# Patient Record
Sex: Male | Born: 1950 | Race: Black or African American | Hispanic: No | Marital: Single | Smoking: Former smoker
Health system: Southern US, Community
[De-identification: ages and names within clinical notes are randomized; demographics above are authoritative.]

## PROBLEM LIST (undated history)

## (undated) DIAGNOSIS — I10 Essential (primary) hypertension: Secondary | ICD-10-CM

---

## 2012-05-20 ENCOUNTER — Emergency Department: Payer: Self-pay | Admitting: *Deleted

## 2012-10-07 ENCOUNTER — Observation Stay: Payer: Self-pay | Admitting: Internal Medicine

## 2012-10-07 LAB — CBC
HCT: 47.8 % (ref 40.0–52.0)
HGB: 16.2 g/dL (ref 13.0–18.0)
MCH: 31.1 pg (ref 26.0–34.0)
MCHC: 33.8 g/dL (ref 32.0–36.0)
MCV: 92 fL (ref 80–100)
RBC: 5.2 10*6/uL (ref 4.40–5.90)
RDW: 13.9 % (ref 11.5–14.5)
WBC: 7.6 10*3/uL (ref 3.8–10.6)

## 2012-10-07 LAB — BASIC METABOLIC PANEL
Anion Gap: 7 (ref 7–16)
BUN: 10 mg/dL (ref 7–18)
Chloride: 106 mmol/L (ref 98–107)
EGFR (African American): >60
EGFR (Non-African Amer.): >60
Sodium: 141 mmol/L (ref 136–145)

## 2012-10-08 LAB — BASIC METABOLIC PANEL
Anion Gap: 7 (ref 7–16)
BUN: 10 mg/dL (ref 7–18)
Calcium, Total: 9.4 mg/dL (ref 8.5–10.1)
Co2: 25 mmol/L (ref 21–32)
EGFR (Non-African Amer.): >60
Osmolality: 279 (ref 275–301)
Potassium: 4 mmol/L (ref 3.5–5.1)
Sodium: 139 mmol/L (ref 136–145)

## 2014-12-24 NOTE — Discharge Summary (Signed)
PATIENT NAME:  Lawrence Barnett, Lawrence Barnett MR#:  [id number] DATE OF BIRTH:  24-Oct-1950  DATE OF ADMISSION:  10/07/2012 DATE OF DISCHARGE:  10/08/2012  ADMITTING PHYSICIAN:  Dr. Mordecai MaesSanchez.  DISCHARGING PHYSICIAN:  Dr. Enid Baasadhika Saivon Prowse.  PRIMARY CARE PHYSICIAN:  None.   CONSULTATIONS IN THE HOSPITAL:  None.   DISCHARGE DIAGNOSES: 1.  Acute angioedema with facial swelling secondary to lisinopril.  2.  Hypertension.  3.  Tobacco use disorder.  4.  Gastroesophageal reflux disease.   DISCHARGE MEDICATIONS: 1.  Over-the-counter lansoprazole 30 mg by mouth daily.  2.  Metoprolol 25 mg by mouth twice daily.  3.  HCTZ 25 mg by mouth daily.  4.  Prednisone taper.   DISCHARGE DIET:  Low-sodium diet.   DISCHARGE ACTIVITY:  As tolerated.   FOLLOWUP INSTRUCTIONS:  PCP follow-up in one week.  The patient was in the process of getting an outpatient PCP appointment done.  He said he would take care of it.   LABORATORY AND IMAGING STUDIES:  Sodium 139, potassium 4.0, chloride 107, bicarb 25, BUN 10, creatinine 0.83, glucose 133, calcium of 9.4.  WBC 7.6, hemoglobin 16.2, hematocrit 47.8, platelet count 306.   BRIEF HOSPITAL COURSE:  The patient is a 64 year old male with no significant past medical history other than recently diagnosed with hypertension and was started on lisinopril at an urgent care center when he presented for upper respiratory tract infection symptoms.  Five days into treatment he noticed that his face was swollen and was brought to the ER.   Acute angioedema, likely secondary to lisinopril.  His lisinopril was stopped.  He was started on IV steroids and ranitidine for antihistamine properties and also Benadryl as needed.  His symptoms have improved a lot.  He did not have any airway compromise or stridor and he was able to ambulate, breathe normally prior to discharge and he was very anxious to go home since patient's swelling has improved and was almost back to normal.  He was started on  metoprolol and also HCTZ which he tolerated well in the hospital and is being discharged on the same medications.  He was also counseled against smoking and advised to get an appointment with primary care physician in the next week.  He was explained about the symptoms that he needs to watch out for and come to the ER if he has any trouble.  His course has been otherwise uneventful in the hospital.   DISCHARGE CONDITION:  Stable.   DISCHARGE DISPOSITION:  Home.   TIME SPENT ON DISCHARGE:  Forty minutes.     ____________________________ Enid Baasadhika Elecia Serafin, MD rk:ea Barnett: 10/08/2012 16:14:00 ET T: 10/09/2012 00:04:31 ET JOB#: [id number]  cc: Enid Baasadhika Mariyana Fulop, MD, <Dictator> Enid BaasADHIKA Skyann Ganim MD ELECTRONICALLY SIGNED 10/09/2012 19:49

## 2014-12-24 NOTE — H&P (Signed)
PATIENT NAME:  Lawrence Barnett, Lawrence Barnett MR#:  [id number] DATE OF BIRTH:  11-19-1950  DATE OF ADMISSION:  10/07/2012  PRIMARY CARE PHYSICIAN: None local.   REFERRING PHYSICIAN: Dr. Si Raider.  CHIEF COMPLAINT: Swelling of tongue, mouth and neck, vomiting.   HISTORY OF PRESENT ILLNESS: The patient is a very nice 64 year old gentleman who has history of hypertension and GERD. He comes today with a history of going to the urgent care due to swelling of his mouth and tongue. Apparently, the patient had been seen 5 days ago at the urgent care due to cough. At that moment, he was diagnosed with upper respiratory infection and he was given azithromycin. The patient completed the treatment with azithromycin. He also was given a prescription for lisinopril and hydrochlorothiazide due to his blood pressure being elevated. He was given a sample box of 30 pills and he took 5 of them. He did not have any problems up until this morning. Whenever he got up he went to the bathroom, started to have breakfast, went to have a cigarette and then went to brush his teeth. When he was doing some swish and spit with Listerine, he started having a bad taste in mouth so he spit the Listerine, walked a couple of steps and then he had a severe episode of vomiting that came out without any significant nausea. He vomited a little bit more after that, and then he decided to go to the urgent care. During his car ride, he started having some significant swelling of his neck, mouth and tongue and tingling around his mouth. When he was in the urgent care, he was diagnosed with angioedema. He had severe swelling. He was given Benadryl and Solu Medrol IV. His voice was very muffled and his tongue was really swollen. He did not have any chest pain or shortness of breath or stridor during these episodes.   The patient was transferred to the ER where he was found to have elevated blood pressures with systolics of 198 and diastolics up to 120. The  patient never had any decrease of oxygen saturation. He is actually feeling a little bit better, but still has pronounced swelling of oral mucosa and his pulse is around 100. His blood pressure continues to be severely elevated. The patient is admitted to CCU for close observation due to this possible fatal condition.   REVIEW OF SYSTEMS:  CONSTITUTIONAL: Denies any fever, fatigue, weakness.  EYES: Denies any blurry vision, glaucoma, cataracts or eye pain.  ENT: Negative tinnitus. Negative ear pain. Positive swelling of the tongue. Swelling of the mouth mucosa. No epistaxis. No postnasal drip. No difficulty swallowing at this moment. He can swallow saliva but no food.  RESPIRATORY: No cough. No wheezing. No hemoptysis. No chronic COPD. No pneumonia. He was treated for upper respiratory infection with azithromycin and it has improved.  CARDIOVASCULAR: No chest pain, orthopnea, palpitations or syncope.  GASTROINTESTINAL: No nausea. Positive vomiting twice. No diarrhea. No abdominal pain. No hematemesis. No melena. No jaundice. No rectal bleeding.  GENITOURINARY: No hematuria, dysuria or changes in frequency. No prostatitis.  ENDOCRINE: No polyuria, polydipsia or polyphagia. No cold or heat intolerance.  HEMATOLOGIC AND LYMPHATIC: No anemia, easy bruising, bleeding or swollen glands. SKIN: No rashes or petechiae.  MUSCULOSKELETAL: No significant neck or back pain, shoulder pain or gout.  NEUROLOGIC: No numbness, tingling. No ataxia. No CVA. No TIA.  PSYCHIATRIC: No depression or anxiety.   PAST MEDICAL HISTORY:  1.  Hypertension.  2.  GERD.   ALLERGIES: ACE INHIBITORS AND SHELLFISH.   PAST SURGICAL HISTORY: Negative.   FAMILY HISTORY: Negative for MI. Negative for diabetes or hypertension. Positive cancer in his dad who had bone cancer, and his mom had some cancer that he cannot tell me where it was located. The patient tears up and gets really sad by talking about his mother's condition. She  passed in 2008.   SOCIAL HISTORY: The patient lives by himself. He moved from [place] about a year ago. 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 He smokes 7 or 8 cigarettes a day and he has been smoking since the age of 44. He states that he drinks 3 or 4 ounces of wine every day.   MEDICATIONS: Pepcid AC, Tylenol, hydrochlorothiazide and Claritin-D. His medication was changed 5 days ago for lisinopril with hydrochlorothiazide, which we stopped now.   PHYSICAL EXAMINATION:  VITAL SIGNS: Blood pressure 198/120, pulse 99, respirations 20, temperature 98, oxygen saturation 100% on room air.  GENERAL: The patient is alert, oriented x 3, not in acute distress. No respiratory distress. Hemodynamically stable.  HEENT: His pupils are equal and reactive. Extraocular movements are intact. Mucosae are moist but swollen at the level of the mouth. His tongue is enlarged, very swollen. I cannot see uvula or soft palate due to swelling of the tongue. He has swelling of the base of the mouth. There is no swelling of the lips. There is some significant swelling at the level of the neck from the angle of the jaw up to the thyroid cartilage.  NECK: There is no stridor in his neck. Otherwise does not have any masses or thyromegaly. There is no lymphadenopathy.  CARDIOVASCULAR: Regular rate and rhythm, slightly tachycardic. No murmurs, rubs or gallops. Positive S4. No displacement of PMI. No tenderness to palpation of anterior chest.  LUNGS: Clear without any wheezing, crepitus or rales. No use of accessory muscles.  ABDOMEN: Soft, nontender, nondistended. No hepatosplenomegaly. No masses. Bowel sounds are positive.  GENITALIA: Deferred.  EXTREMITIES: No edema, no cyanosis, no clubbing.  SKIN: Without any rashes or petechiae. No erythema at any level of the neck or face. No wheals. LYMPHATIC: Negative for lymphadenopathy in the neck,  supraclavicular areas or epitrochlear.  MUSCULOSKELETAL: No significant joint deformity, joint abnormalities.  PSYCHIATRIC: Mood is normal without any signs of depression, although the patient tears up whenever he talks about his dead mother.  NEUROLOGIC: Cranial nerves II through XII intact. No focal abnormalities.   LABORATORY DATA: Glucose 105, creatinine 0.83, electrolytes within normal limits. White count is 7.6, hemoglobin 16.2, platelets 306. EKG: Normal sinus rhythm, no signs of LVH.   ASSESSMENT AND PLAN: This is a 64 year old gentleman with history of hypertension and gastroesophageal reflux disease, recent prescription for lisinopril. The patient developed severe angioedema 5 days after he started the prescription.  1.  Angioedema due to angiotensin-converting enzyme inhibitors. Lisinopril has been stopped. The patient was given Solu Medrol and Benadryl at the urgent care, transfer over here to the Emergency Room for observation admission. The patient is going to be admitted to the Critical Care Unit to be monitored closely for any type of airway obstruction. The patient has not shown any respiratory distress or stridor. He is tachycardic and hypertensive. He needs to be watched closely. Angiotensin-converting enzyme inhibitors have not shown to have any significant improvement of symptoms of angioedema, although at this moment I  do not have many other options to use in him, so we are going to continue IV Solu Medrol. We are going to continue H2 inhibitors with ranitidine and H1 inhibitors with Benadryl. If there is any worsening, we could use fresh frozen plasma since they contain angiotensin-converting enzyme, which could break down bradykinins which are the cause of the angioedema. At this moment he is stable. We are just going to observe very closely.  2.  Malignant hypertension. He has diastolic blood pressures of 120s. Labetalol and hydralazine have been administered to the patient. Monitor  closely. The patient has a prescription for hydrochlorothiazide in the past. We are going to restart and I am going to add on amlodipine. The patient has been informed about side effects of amlodipine, which usually do not include angioedema but could cause peripheral edema.  3.  Tobacco abuse. The patient is advised about consequences of tobacco use. He has been consulted for about 4 minutes about tobacco/smoking cessation.   CODE STATUS: The patient is a full code.   CRITICAL CARE TIME: I spent about 45 minutes with this patient.   ____________________________ Felipa Furnaceoberto Sanchez Gutierrez, MD rsg:jm D: 10/07/2012 17:15:23 ET T: 10/07/2012 19:25:40 ET JOB#: [id number]  cc: Felipa Furnaceoberto Sanchez Gutierrez, MD, <Dictator> Aul Mangieri Juanda ChanceSANCHEZ GUTIERRE MD ELECTRONICALLY SIGNED 10/14/2012 13:45

## 2018-03-09 ENCOUNTER — Emergency Department
Admission: EM | Admit: 2018-03-09 | Discharge: 2018-03-09 | Disposition: A | Discharge status: Home / Self Care | Payer: Medicare Other | Attending: Emergency Medicine | Admitting: Emergency Medicine

## 2018-03-09 ENCOUNTER — Encounter: Payer: Self-pay | Admitting: Emergency Medicine

## 2018-03-09 DIAGNOSIS — I10 Essential (primary) hypertension: Secondary | ICD-10-CM | POA: Diagnosis not present

## 2018-03-09 DIAGNOSIS — Z87891 Personal history of nicotine dependence: Secondary | ICD-10-CM | POA: Insufficient documentation

## 2018-03-09 DIAGNOSIS — T783XXA Angioneurotic edema, initial encounter: Secondary | ICD-10-CM | POA: Diagnosis not present

## 2018-03-09 DIAGNOSIS — R220 Localized swelling, mass and lump, head: Secondary | ICD-10-CM | POA: Diagnosis present

## 2018-03-09 HISTORY — DX: Essential (primary) hypertension: I10

## 2018-03-09 MED ORDER — FAMOTIDINE IN NACL 20-0.9 MG/50ML-% IV SOLN
INTRAVENOUS | Status: AC
  Filled 2018-03-09: qty 50

## 2018-03-09 MED ORDER — DIPHENHYDRAMINE HCL 50 MG/ML IJ SOLN
50.0000 mg | Freq: Once | INTRAMUSCULAR | Status: AC
  Administered 2018-03-09: 50 mg via INTRAVENOUS

## 2018-03-09 MED ORDER — PREDNISONE 20 MG PO TABS: 60.0000 mg | ORAL_TABLET | Freq: Every day | ORAL | 0 refills | Status: DC

## 2018-03-09 MED ORDER — DIPHENHYDRAMINE HCL 50 MG/ML IJ SOLN
INTRAMUSCULAR | Status: AC
  Filled 2018-03-09: qty 1

## 2018-03-09 MED ORDER — EPINEPHRINE 0.3 MG/0.3ML IJ SOAJ
INTRAMUSCULAR | Status: AC
  Filled 2018-03-09: qty 0.3

## 2018-03-09 MED ORDER — FAMOTIDINE 40 MG PO TABS: 40.0000 mg | ORAL_TABLET | Freq: Every evening | ORAL | 0 refills | Status: DC

## 2018-03-09 MED ORDER — METHYLPREDNISOLONE SODIUM SUCC 125 MG IJ SOLR
125.0000 mg | Freq: Once | INTRAMUSCULAR | Status: AC
  Administered 2018-03-09: 125 mg via INTRAVENOUS

## 2018-03-09 MED ORDER — FAMOTIDINE IN NACL 20-0.9 MG/50ML-% IV SOLN
20.0000 mg | Freq: Once | INTRAVENOUS | Status: AC
  Administered 2018-03-09: 20 mg via INTRAVENOUS

## 2018-03-09 MED ORDER — METHYLPREDNISOLONE SODIUM SUCC 125 MG IJ SOLR
INTRAMUSCULAR | Status: AC
  Filled 2018-03-09: qty 2

## 2018-03-09 NOTE — ED Triage Notes (Signed)
Patient states that he woke up about 01:00 with lip swelling. Patient states that the swelling has been getting worse.

## 2018-03-09 NOTE — ED Notes (Signed)
Assessment: pt with upper and lower lip angioedema. Pt states he woke up this am with upper lip swelling, pt states on way to ed began to have lower lip swelling. Pt does not have airway compromise at this time. resps unlabored. Pt denies nausea, shob, throat pain or sensation of throat swelling.

## 2018-03-09 NOTE — ED Provider Notes (Signed)
[facility] Emergency Department Provider Note   ____________________________________________   First MD Initiated Contact with Patient 03/09/18 0303     (approximate)  I have reviewed the triage vital signs and the nursing notes.   HISTORY  Chief Complaint Oral Swelling    HPI Lawrence Barnett is a 67 y.o. male who comes into the hospital today with some swelling to his mouth.  The patient states that he went to bed fine around 10:00 and then woke up at 1 AM with swelling to his top lip.  The patient is never had this happen before.  The patient takes benazepril daily.  He reports that as he came in here his lower lip seemed to get more swollen.  He denies any swelling to the back of his throat or any shortness of breath.  The patient is here today for evaluation.   Past Medical History:  Diagnosis Date  . Hypertension     There are no active problems to display for this patient.   History reviewed. No pertinent surgical history.  Prior to Admission medications   Medication Sig Start Date End Date Taking? Authorizing Provider  famotidine (PEPCID) 40 MG tablet Take 1 tablet (40 mg total) by mouth every evening. 03/09/18 03/09/19  Rebecka Apley, MD  predniSONE (DELTASONE) 20 MG tablet Take 3 tablets (60 mg total) by mouth daily. 03/09/18   Rebecka Apley, MD    Allergies Penicillins  No family history on file.  Social History Social History   Tobacco Use  . Smoking status: Former Games developer  . Smokeless tobacco: Never Used  Substance Use Topics  . Alcohol use: Yes  . Drug use: Never    Review of Systems  Constitutional: No fever/chills Eyes: No visual changes. ENT: Lip swelling Cardiovascular: Denies chest pain. Respiratory: Denies shortness of breath. Gastrointestinal: No abdominal pain.  No nausea, no vomiting.   Genitourinary: Negative for dysuria. Musculoskeletal: Negative for back pain. Skin: Negative for rash. Neurological:  Negative for headaches, focal weakness or numbness.   ____________________________________________   PHYSICAL EXAM:  VITAL SIGNS: ED Triage Vitals  Enc Vitals Group     BP 03/09/18 0303 (!) 184/118     Pulse Rate 03/09/18 0303 87     Resp --      Temp 03/09/18 0303 97.7 F (36.5 C)     Temp Source 03/09/18 0303 Oral     SpO2 03/09/18 0303 99 %     Weight 03/09/18 0254 190 lb (86.2 kg)     Height 03/09/18 0254 6' (1.829 m)     Head Circumference --      Peak Flow --      Pain Score 03/09/18 0254 0     Pain Loc --      Pain Edu? --      Excl. in GC? --     Constitutional: Alert and oriented. Well appearing and in no acute distress. Eyes: Conjunctivae are normal. PERRL. EOMI. Head: Atraumatic. Nose: No congestion/rhinnorhea. Mouth/Throat: Upper and lower lip swelling mucous membranes are moist.  Oropharynx non-erythematous. Cardiovascular: Normal rate, regular rhythm. Grossly normal heart sounds.  Good peripheral circulation. Respiratory: Normal respiratory effort.  No retractions. Lungs CTAB. Gastrointestinal: Soft and nontender. No distention.  Positive bowel sounds Musculoskeletal: No lower extremity tenderness nor edema.   Neurologic:  Normal speech and language.  Skin:  Skin is warm, dry and intact.  Psychiatric: Mood and affect are normal.   ____________________________________________   LABS (  all labs ordered are listed, but only abnormal results are displayed)  Labs Reviewed - No data to display ____________________________________________  EKG  none ____________________________________________  RADIOLOGY  ED MD interpretation:  none  Official radiology report(s): No results found.  ____________________________________________   PROCEDURES  Procedure(s) performed: None  Procedures  Critical Care performed: No  ____________________________________________   INITIAL IMPRESSION / ASSESSMENT AND PLAN / ED COURSE  As part of my medical  decision making, I reviewed the following data within the electronic MEDICAL RECORD NUMBER Notes from prior ED visits and [place] Controlled Substance Database   This is a 67 year old male who comes into the hospital today with some angioedema.  The patient's lips are swollen but he is not having any tongue swelling or shortness of breath.  We did give the patient a dose of Benadryl 50 mg IV x1 dose, Pepcid 20 mg IV x1 dose and Solu-Medrol 125 mg IV x1 dose.  We then monitor the patient for a few hours in the emergency department.  After being monitored the patient's lip swelling did improve.  The patient will be discharged home.  He is been told no longer to take his benazepril and to follow-up with his primary care physician.      ____________________________________________   FINAL CLINICAL IMPRESSION(S) / ED DIAGNOSES  Final diagnoses:  Angioedema, initial encounter     ED Discharge Orders        Ordered    predniSONE (DELTASONE) 20 MG tablet  Daily     03/09/18 0636    famotidine (PEPCID) 40 MG tablet  Every evening     03/09/18 0636       Note:  This document was prepared using Dragon voice recognition software and may include unintentional dictation errors.    Rebecka ApleyWebster, Allison P, MD 03/09/18 [phone]

## 2018-03-09 NOTE — ED Notes (Signed)
Pt up to urinate. 

## 2018-03-09 NOTE — ED Notes (Signed)
Pt states he would like to be discharged if possible.

## 2018-03-09 NOTE — Discharge Instructions (Addendum)
Please no longer take your benazepril as I believe that is the cause of your lip swelling. Please follow up with your primary care physician and please return with any worsened symptoms or any other concerns.

## 2018-03-09 NOTE — ED Notes (Signed)
md in to reassess. 

## 2018-03-09 NOTE — ED Notes (Signed)
Pt with improved upper lip swelling, no noticeable improvement in lower lip swelling. Pt continues to deny shob, sensation of throat swelling.

## 2018-03-09 NOTE — ED Notes (Signed)
Pt with slightly improved upper lip swelling. Pt continues to deny sensation of throat swelling or difficulty breathing. Pt is able to speak in full clear sentences and displays no airway compromise at this time.

## 2019-07-16 ENCOUNTER — Encounter: Payer: Self-pay | Admitting: Emergency Medicine

## 2019-07-16 ENCOUNTER — Other Ambulatory Visit: Payer: Self-pay

## 2019-07-16 ENCOUNTER — Emergency Department: Payer: Medicare Other

## 2019-07-16 ENCOUNTER — Inpatient Hospital Stay: Payer: Medicare Other

## 2019-07-16 ENCOUNTER — Inpatient Hospital Stay
Admission: EM | Admit: 2019-07-16 | Discharge: 2019-07-17 | DRG: 871 | Disposition: A | Discharge status: Other Acute Inpatient Hospital | Payer: Medicare Other | Attending: Internal Medicine | Admitting: Internal Medicine

## 2019-07-16 DIAGNOSIS — A419 Sepsis, unspecified organism: Secondary | ICD-10-CM | POA: Diagnosis not present

## 2019-07-16 DIAGNOSIS — K709 Alcoholic liver disease, unspecified: Secondary | ICD-10-CM | POA: Diagnosis present

## 2019-07-16 DIAGNOSIS — N170 Acute kidney failure with tubular necrosis: Secondary | ICD-10-CM | POA: Diagnosis present

## 2019-07-16 DIAGNOSIS — Z79899 Other long term (current) drug therapy: Secondary | ICD-10-CM

## 2019-07-16 DIAGNOSIS — Y93E1 Activity, personal bathing and showering: Secondary | ICD-10-CM

## 2019-07-16 DIAGNOSIS — R6520 Severe sepsis without septic shock: Secondary | ICD-10-CM | POA: Diagnosis present

## 2019-07-16 DIAGNOSIS — Z880 Allergy status to penicillin: Secondary | ICD-10-CM | POA: Diagnosis not present

## 2019-07-16 DIAGNOSIS — J1289 Other viral pneumonia: Secondary | ICD-10-CM | POA: Diagnosis present

## 2019-07-16 DIAGNOSIS — R000 Tachycardia, unspecified: Secondary | ICD-10-CM | POA: Diagnosis present

## 2019-07-16 DIAGNOSIS — E876 Hypokalemia: Secondary | ICD-10-CM | POA: Diagnosis present

## 2019-07-16 DIAGNOSIS — R918 Other nonspecific abnormal finding of lung field: Secondary | ICD-10-CM | POA: Diagnosis not present

## 2019-07-16 DIAGNOSIS — U071 COVID-19: Secondary | ICD-10-CM | POA: Diagnosis present

## 2019-07-16 DIAGNOSIS — Z886 Allergy status to analgesic agent status: Secondary | ICD-10-CM | POA: Diagnosis not present

## 2019-07-16 DIAGNOSIS — Z888 Allergy status to other drugs, medicaments and biological substances status: Secondary | ICD-10-CM

## 2019-07-16 DIAGNOSIS — R197 Diarrhea, unspecified: Secondary | ICD-10-CM | POA: Diagnosis present

## 2019-07-16 DIAGNOSIS — S0990XA Unspecified injury of head, initial encounter: Secondary | ICD-10-CM | POA: Diagnosis present

## 2019-07-16 DIAGNOSIS — R7401 Elevation of levels of liver transaminase levels: Secondary | ICD-10-CM

## 2019-07-16 DIAGNOSIS — A4189 Other specified sepsis: Principal | ICD-10-CM | POA: Diagnosis present

## 2019-07-16 DIAGNOSIS — Z87891 Personal history of nicotine dependence: Secondary | ICD-10-CM

## 2019-07-16 DIAGNOSIS — I10 Essential (primary) hypertension: Secondary | ICD-10-CM | POA: Diagnosis present

## 2019-07-16 DIAGNOSIS — W182XXA Fall in (into) shower or empty bathtub, initial encounter: Secondary | ICD-10-CM | POA: Diagnosis present

## 2019-07-16 DIAGNOSIS — R4182 Altered mental status, unspecified: Secondary | ICD-10-CM

## 2019-07-16 DIAGNOSIS — F1010 Alcohol abuse, uncomplicated: Secondary | ICD-10-CM | POA: Diagnosis present

## 2019-07-16 DIAGNOSIS — J181 Lobar pneumonia, unspecified organism: Secondary | ICD-10-CM

## 2019-07-16 DIAGNOSIS — E872 Acidosis, unspecified: Secondary | ICD-10-CM | POA: Diagnosis present

## 2019-07-16 DIAGNOSIS — R55 Syncope and collapse: Secondary | ICD-10-CM | POA: Diagnosis present

## 2019-07-16 DIAGNOSIS — J9601 Acute respiratory failure with hypoxia: Secondary | ICD-10-CM | POA: Diagnosis present

## 2019-07-16 LAB — CBC WITH DIFFERENTIAL/PLATELET
Abs Immature Granulocytes: 0.04 10*3/uL (ref 0.00–0.07)
Basophils Absolute: 0 10*3/uL (ref 0.0–0.1)
Basophils Relative: 0 %
Eosinophils Absolute: 0 10*3/uL (ref 0.0–0.5)
Eosinophils Relative: 0 %
HCT: 45.6 % (ref 39.0–52.0)
Hemoglobin: 15.8 g/dL (ref 13.0–17.0)
Immature Granulocytes: 0 %
Lymphocytes Relative: 6 %
Lymphs Abs: 0.6 10*3/uL — ABNORMAL LOW (ref 0.7–4.0)
MCH: 29.2 pg (ref 26.0–34.0)
MCHC: 34.6 g/dL (ref 30.0–36.0)
MCV: 84.3 fL (ref 80.0–100.0)
Monocytes Absolute: 0.4 10*3/uL (ref 0.1–1.0)
Monocytes Relative: 4 %
Neutro Abs: 9.4 10*3/uL — ABNORMAL HIGH (ref 1.7–7.7)
Neutrophils Relative %: 90 %
Platelets: 198 10*3/uL (ref 150–400)
RBC: 5.41 MIL/uL (ref 4.22–5.81)
RDW: 13.4 % (ref 11.5–15.5)
Smear Review: NORMAL
WBC: 10.4 10*3/uL (ref 4.0–10.5)
nRBC: 0 % (ref 0.0–0.2)

## 2019-07-16 LAB — COMPREHENSIVE METABOLIC PANEL
ALT: 205 U/L — ABNORMAL HIGH (ref 0–44)
AST: 716 U/L — ABNORMAL HIGH (ref 15–41)
Albumin: 4.2 g/dL (ref 3.5–5.0)
Alkaline Phosphatase: 49 U/L (ref 38–126)
Anion gap: 18 — ABNORMAL HIGH (ref 5–15)
BUN: 38 mg/dL — ABNORMAL HIGH (ref 8–23)
CO2: 23 mmol/L (ref 22–32)
Calcium: 9.8 mg/dL (ref 8.9–10.3)
Chloride: 100 mmol/L (ref 98–111)
Creatinine, Ser: 1.93 mg/dL — ABNORMAL HIGH (ref 0.61–1.24)
GFR calc Af Amer: 40 mL/min — ABNORMAL LOW (ref >60)
GFR calc non Af Amer: 34 mL/min — ABNORMAL LOW (ref >60)
Glucose, Bld: 156 mg/dL — ABNORMAL HIGH (ref 70–99)
Potassium: 2.9 mmol/L — ABNORMAL LOW (ref 3.5–5.1)
Sodium: 141 mmol/L (ref 135–145)
Total Bilirubin: 1.4 mg/dL — ABNORMAL HIGH (ref 0.3–1.2)
Total Protein: 9 g/dL — ABNORMAL HIGH (ref 6.5–8.1)

## 2019-07-16 LAB — SODIUM, URINE, RANDOM: Sodium, Ur: 60 mmol/L

## 2019-07-16 LAB — PROCALCITONIN: Procalcitonin: 21.06 ng/mL

## 2019-07-16 LAB — URINALYSIS, COMPLETE (UACMP) WITH MICROSCOPIC
Bacteria, UA: NONE SEEN
Bilirubin Urine: NEGATIVE
Glucose, UA: NEGATIVE mg/dL
Ketones, ur: NEGATIVE mg/dL
Leukocytes,Ua: NEGATIVE
Nitrite: NEGATIVE
Protein, ur: 100 mg/dL — AB
Specific Gravity, Urine: 1.011 (ref 1.005–1.030)
Squamous Epithelial / LPF: NONE SEEN (ref 0–5)
pH: 6 (ref 5.0–8.0)

## 2019-07-16 LAB — PROTIME-INR
INR: 1.1 (ref 0.8–1.2)
INR: 1.1 (ref 0.8–1.2)
Prothrombin Time: 13.7 seconds (ref 11.4–15.2)
Prothrombin Time: 13.9 seconds (ref 11.4–15.2)

## 2019-07-16 LAB — TROPONIN I (HIGH SENSITIVITY)
Troponin I (High Sensitivity): 236 ng/L (ref <18)
Troponin I (High Sensitivity): 244 ng/L (ref <18)

## 2019-07-16 LAB — CBC
HCT: 41.1 % (ref 39.0–52.0)
Hemoglobin: 14.2 g/dL (ref 13.0–17.0)
MCH: 29.1 pg (ref 26.0–34.0)
MCHC: 34.5 g/dL (ref 30.0–36.0)
MCV: 84.2 fL (ref 80.0–100.0)
Platelets: 159 10*3/uL (ref 150–400)
RBC: 4.88 MIL/uL (ref 4.22–5.81)
RDW: 13.4 % (ref 11.5–15.5)
WBC: 8 10*3/uL (ref 4.0–10.5)
nRBC: 0 % (ref 0.0–0.2)

## 2019-07-16 LAB — C-REACTIVE PROTEIN: CRP: 21 mg/dL — ABNORMAL HIGH (ref <1.0)

## 2019-07-16 LAB — SEDIMENTATION RATE: Sed Rate: 21 mm/hr — ABNORMAL HIGH (ref 0–20)

## 2019-07-16 LAB — FIBRIN DERIVATIVES D-DIMER (ARMC ONLY): Fibrin derivatives D-dimer (ARMC): 2789.27 ng/mL (FEU) — ABNORMAL HIGH (ref 0.00–499.00)

## 2019-07-16 LAB — AMMONIA: Ammonia: 25 umol/L (ref 9–35)

## 2019-07-16 LAB — LACTIC ACID, PLASMA
Lactic Acid, Venous: 3.8 mmol/L (ref 0.5–1.9)
Lactic Acid, Venous: 3.9 mmol/L (ref 0.5–1.9)
Lactic Acid, Venous: 2.4 mmol/L (ref 0.5–1.9)
Lactic Acid, Venous: 2.2 mmol/L (ref 0.5–1.9)

## 2019-07-16 LAB — CREATININE, SERUM
Creatinine, Ser: 1.5 mg/dL — ABNORMAL HIGH (ref 0.61–1.24)
GFR calc Af Amer: 54 mL/min — ABNORMAL LOW (ref >60)
GFR calc non Af Amer: 46 mL/min — ABNORMAL LOW (ref >60)

## 2019-07-16 LAB — CREATININE, URINE, RANDOM: Creatinine, Urine: 64 mg/dL

## 2019-07-16 LAB — APTT: aPTT: 28 seconds (ref 24–36)

## 2019-07-16 MED ORDER — LEVOFLOXACIN IN D5W 750 MG/150ML IV SOLN
750.0000 mg | Freq: Once | INTRAVENOUS | Status: AC
  Administered 2019-07-16: 750 mg via INTRAVENOUS
  Filled 2019-07-16: qty 150

## 2019-07-16 MED ORDER — SODIUM CHLORIDE 0.9 % IV BOLUS
500.0000 mL | Freq: Once | INTRAVENOUS | Status: AC
  Administered 2019-07-16: 500 mL via INTRAVENOUS

## 2019-07-16 MED ORDER — ADULT MULTIVITAMIN W/MINERALS CH
1.0000 | ORAL_TABLET | Freq: Every day | ORAL | Status: DC
  Administered 2019-07-17: 1 via ORAL
  Filled 2019-07-16: qty 1

## 2019-07-16 MED ORDER — THIAMINE HCL 100 MG/ML IJ SOLN
100.0000 mg | Freq: Every day | INTRAMUSCULAR | Status: DC
  Administered 2019-07-17: 100 mg via INTRAVENOUS
  Filled 2019-07-16: qty 2

## 2019-07-16 MED ORDER — VANCOMYCIN HCL IN DEXTROSE 1-5 GM/200ML-% IV SOLN: 1000.0000 mg | INTRAVENOUS | Status: DC

## 2019-07-16 MED ORDER — VANCOMYCIN HCL 10 G IV SOLR
1750.0000 mg | Freq: Once | INTRAVENOUS | Status: AC
  Administered 2019-07-16: 1750 mg via INTRAVENOUS
  Filled 2019-07-16: qty 1750

## 2019-07-16 MED ORDER — FOLIC ACID 1 MG PO TABS
1.0000 mg | ORAL_TABLET | Freq: Every day | ORAL | Status: DC
  Administered 2019-07-17: 1 mg via ORAL
  Filled 2019-07-16: qty 1

## 2019-07-16 MED ORDER — ONDANSETRON HCL 4 MG PO TABS: 4.0000 mg | ORAL_TABLET | Freq: Four times a day (QID) | ORAL | Status: DC | PRN

## 2019-07-16 MED ORDER — LORAZEPAM 1 MG PO TABS: 1.0000 mg | ORAL_TABLET | ORAL | Status: DC | PRN

## 2019-07-16 MED ORDER — POTASSIUM CHLORIDE CRYS ER 20 MEQ PO TBCR
40.0000 meq | EXTENDED_RELEASE_TABLET | ORAL | Status: AC
  Administered 2019-07-16 (×2): 40 meq via ORAL
  Filled 2019-07-16 (×2): qty 2

## 2019-07-16 MED ORDER — SODIUM CHLORIDE 0.9 % IV SOLN
INTRAVENOUS | Status: DC
  Administered 2019-07-17: 01:00:00 via INTRAVENOUS

## 2019-07-16 MED ORDER — IBUPROFEN 600 MG PO TABS
600.0000 mg | ORAL_TABLET | ORAL | Status: DC
  Filled 2019-07-16: qty 1

## 2019-07-16 MED ORDER — SODIUM CHLORIDE 0.9 % IV BOLUS
1000.0000 mL | Freq: Once | INTRAVENOUS | Status: AC
  Administered 2019-07-16: 1000 mL via INTRAVENOUS

## 2019-07-16 MED ORDER — VITAMIN B-1 100 MG PO TABS: 100.0000 mg | ORAL_TABLET | Freq: Every day | ORAL | Status: DC

## 2019-07-16 MED ORDER — SODIUM CHLORIDE 0.9% FLUSH
3.0000 mL | Freq: Two times a day (BID) | INTRAVENOUS | Status: DC
  Administered 2019-07-17 (×2): 3 mL via INTRAVENOUS

## 2019-07-16 MED ORDER — LORAZEPAM 2 MG/ML IJ SOLN: 1.0000 mg | INTRAMUSCULAR | Status: DC | PRN

## 2019-07-16 MED ORDER — SODIUM CHLORIDE 0.9 % IV SOLN
2.0000 g | Freq: Two times a day (BID) | INTRAVENOUS | Status: DC
  Administered 2019-07-17: 2 g via INTRAVENOUS
  Filled 2019-07-16: qty 2

## 2019-07-16 MED ORDER — ENOXAPARIN SODIUM 40 MG/0.4ML ~~LOC~~ SOLN
40.0000 mg | SUBCUTANEOUS | Status: DC
  Administered 2019-07-17: 40 mg via SUBCUTANEOUS
  Filled 2019-07-16: qty 0.4

## 2019-07-16 MED ORDER — ONDANSETRON HCL 4 MG/2ML IJ SOLN: 4.0000 mg | Freq: Four times a day (QID) | INTRAMUSCULAR | Status: DC | PRN

## 2019-07-16 MED ORDER — SODIUM CHLORIDE 0.9 % IV BOLUS
500.0000 mL | Freq: Once | INTRAVENOUS | Status: AC
Start: 2019-07-16 — End: 2019-07-16
  Administered 2019-07-16: 500 mL via INTRAVENOUS

## 2019-07-16 NOTE — ED Notes (Signed)
Pt leaving for CT.  

## 2019-07-16 NOTE — ED Notes (Signed)
Next lactic to be collected once 2nd NS bolus finishes.

## 2019-07-16 NOTE — ED Notes (Signed)
Lab called to help collect 2nd set of cultures. Levada Dy states will have phlebotomy drop by soon.

## 2019-07-16 NOTE — Progress Notes (Signed)
Notified bedside nurse of need to draw repeat lactic acid. 

## 2019-07-16 NOTE — ED Notes (Addendum)
Pt given warm blanket and phone to call family. Pt understands urine sample still needed. Has urinal attached to bed rail. Agrees to use call bell to notify this RN once sample ready.

## 2019-07-16 NOTE — Consult Note (Signed)
Pharmacy Antibiotic Note  Lawrence Barnett is a 68 y.o. male admitted on 07/16/2019 with sepsis. Patient BIBEMS after patient was found at home in bathtub after falling and hitting his head. He was found to be febrile, hypertensive, and tachycardic, and tachypneic. Pharmacy has been consulted for vancomycin and cefepime dosing.   He received levofloxacin in ED due to history of PCN allergy. Spoke with patient regarding allergy. He reports it occurred when he was a teenager and his reaction was shortness of breath not requiring hospitalization. Cross reactivity to a fourth-generation cephalosporin is un-likely due to presence of different side chains. MD ok with proceeding.  Creatinine on admission elevated to 1.93 from 1.1 in 12/2018 per [facility] records. Temp in ED 103.1. PCT 21.06. Lactate 3.8. WBC 10.4, mild left shift. LFTs elevated.   Plan: Cefepime 2 g q12h  Vancomycin 1750 mg LD followed by Vancomycin 1000 mg IV Q 24 hrs. Goal AUC 400-550. Expected AUC: 474.8, trough 12.8 SCr used: 1.93  Continue to follow daily Scr per protocol and adjust antibiotics as indicated. Follow culture results and narrow antibiotics as appropriate.  Height: 6' (182.9 cm) Weight: 175 lb (79.4 kg) IBW/kg (Calculated) : 77.6  Temp (24hrs), Avg:103.1 F (39.5 C), Min:103.1 F (39.5 C), Max:103.1 F (39.5 C)  Recent Labs  Lab 07/16/19 1349  WBC 10.4  CREATININE 1.93*  LATICACIDVEN 3.8*    Estimated Creatinine Clearance: 39.1 mL/min (A) (by C-G formula based on SCr of 1.93 mg/dL (H)).    Allergies  Allergen Reactions  . Penicillins Anaphylaxis    Per [facility] records  . Amlodipine Swelling  . Lisinopril Swelling  . Metoprolol Swelling  . Tylenol [Acetaminophen]     Antimicrobials this admission: Levofloxacin x 1 Vancomycin 11/12 >> Cefepime 11/12 >>  Dose adjustments this admission: n/a  Microbiology results: 11/12 BCx: pending 11/12 UCx: pending 11/12 COVID-19: pending  Thank you for  allowing pharmacy to be a part of this patient's care.  [place] Resident 07/16/2019 3:38 PM

## 2019-07-16 NOTE — ED Notes (Signed)
This RN entered room to find pt scooted self down in bed to try and use urinal; Pt didn't make it in time and urinated in bed. Linens changed. Pt wouldn't allow this RN to change briefs. Pt states was able to call and update sister Holley Raring.

## 2019-07-16 NOTE — Progress Notes (Signed)
CODE SEPSIS - PHARMACY COMMUNICATION  **Broad Spectrum Antibiotics should be administered within 1 hour of Sepsis diagnosis**  Time Code Sepsis Called/Page Received:  11/12 @ 2109  Antibiotics Ordered: Levaquin  , Vancomycin   Time of 1st antibiotic administration:  Levaquin on 11/12 @ 1522  Additional action taken by pharmacy:   If necessary, Name of Provider/Nurse Contacted:     Jamisen Hawes D ,PharmD Clinical Pharmacist  07/16/2019  10:21 PM

## 2019-07-16 NOTE — ED Provider Notes (Signed)
[facility] Emergency Department Provider Note   ____________________________________________   First MD Initiated Contact with Patient 07/16/19 1336     (approximate)  I have reviewed the triage vital signs and the nursing notes.   HISTORY  Chief Complaint Weakness and Fall    HPI Lawrence Barnett is a 68 y.o. male with a history of hypertension  Patient reports he has been feeling fine, he got up to go to work today while he was in the shower he got lightheaded and fell backwards.  Thinks he may have passed out briefly.  He got lightheaded standing in the shower and then awoke on the floor of the tub.  Paramedics came and got him, EMS reports they noted that he was febrile as well as tachycardic.  Patient denies being any pain no nausea no vomiting no chest pain no shortness of breath no other symptoms reports he felt quite well until the event occurred.  Denies any Covid exposure   Past Medical History:  Diagnosis Date   Hypertension     Patient Active Problem List   Diagnosis Date Noted   Severe sepsis ([place]) 07/16/2019    History reviewed. No pertinent surgical history.  Prior to Admission medications   Medication Sig Start Date End Date Taking? Authorizing Provider  naproxen sodium (ALEVE) 220 MG tablet Take 440 mg by mouth 2 (two) times daily.    [provider]    Allergies Penicillins, Amlodipine, Lisinopril, Metoprolol, and Tylenol [acetaminophen]  History reviewed. No pertinent family history.  Social History Social History   Tobacco Use   Smoking status: Former Smoker   Smokeless tobacco: Never Used  Substance Use Topics   Alcohol use: Yes   Drug use: Never    Review of Systems Constitutional: No fever/chills but he is noted Eyes: No visual changes. Cardiovascular: Denies chest pain. Respiratory: Denies shortness of breath. Gastrointestinal: No abdominal pain.   Genitourinary: Negative for  dysuria. Musculoskeletal: Negative for back pain.  Denies neck pain. Skin: Negative for rash. Neurological: Negative for headaches, areas of focal weakness or numbness.    ____________________________________________   PHYSICAL EXAM:  VITAL SIGNS: ED Triage Vitals  Enc Vitals Group     BP 07/16/19 1332 (!) 155/103     Pulse Rate 07/16/19 1332 (!) 125     Resp 07/16/19 1332 (!) 24     Temp 07/16/19 1332 (!) 103.1 F (39.5 C)     Temp Source 07/16/19 1332 Oral     SpO2 07/16/19 1332 96 %     Weight 07/16/19 1334 175 lb (79.4 kg)     Height 07/16/19 1334 6' (1.829 m)     Head Circumference --      Peak Flow --      Pain Score 07/16/19 1333 0     Pain Loc --      Pain Edu? --      Excl. in [place]? --     Constitutional: Alert and oriented.  Mildly ill-appearing slightly tachypneic but in no acute extremitas. Eyes: Conjunctivae are normal. Head: Atraumatic. Nose: No congestion/rhinnorhea. Mouth/Throat: Mucous membranes are moist. Neck: No stridor.  Cardiovascular: Normal rate, regular rhythm. Grossly normal heart sounds.  Good peripheral circulation. Respiratory: Normal respiratory effort.  No retractions. Lungs CTAB. Gastrointestinal: Soft and nontender. No distention. Musculoskeletal: No lower extremity tenderness nor edema. Neurologic:  Normal speech and language. No gross focal neurologic deficits are appreciated.  Skin:  Skin is warm, dry and intact.  Patient is a slight lacy rash noted over his extremities and abdomen. Psychiatric: Mood and affect are normal. Speech and behavior are normal.  ____________________________________________   LABS (all labs ordered are listed, but only abnormal results are displayed)  Labs Reviewed  COMPREHENSIVE METABOLIC PANEL - Abnormal; Notable for the following components:      Result Value   Potassium 2.9 (*)    Glucose, Bld 156 (*)    BUN 38 (*)    Creatinine, Ser 1.93 (*)    Total Protein 9.0 (*)    AST 716 (*)    ALT 205  (*)    Total Bilirubin 1.4 (*)    GFR calc non Af Amer 34 (*)    GFR calc Af Amer 40 (*)    Anion gap 18 (*)    All other components within normal limits  LACTIC ACID, PLASMA - Abnormal; Notable for the following components:   Lactic Acid, Venous 3.8 (*)    All other components within normal limits  CBC WITH DIFFERENTIAL/PLATELET - Abnormal; Notable for the following components:   Neutro Abs 9.4 (*)    Lymphs Abs 0.6 (*)    All other components within normal limits  CULTURE, BLOOD (ROUTINE X 2)  CULTURE, BLOOD (ROUTINE X 2)  SARS CORONAVIRUS 2 (TAT 6-24 HRS)  PROTIME-INR  PROCALCITONIN  LACTIC ACID, PLASMA  URINALYSIS, COMPLETE (UACMP) WITH MICROSCOPIC  URINALYSIS, ROUTINE W REFLEX MICROSCOPIC  AMMONIA  HEPATITIS PANEL, ACUTE  FIBRIN DERIVATIVES D-DIMER ([facility] ONLY)  C-REACTIVE PROTEIN  SEDIMENTATION RATE   ____________________________________________  EKG  Reviewed inter by me at 1340 Heart rate 130 QRS 100 QTc 460 Sinus tachycardia, nonspecific T wave abnormality. ____________________________________________  RADIOLOGY  Ct Head Wo Contrast  Result Date: 07/16/2019 CLINICAL DATA:  Head injury after fall today. Uncertain loss of consciousness. EXAM: CT HEAD WITHOUT CONTRAST TECHNIQUE: Contiguous axial images were obtained from the base of the skull through the vertex without intravenous contrast. COMPARISON:  None. FINDINGS: Brain: Mild chronic ischemic white matter disease is noted. No mass effect or midline shift is noted. Ventricular size is within normal limits. There is no evidence of mass lesion, hemorrhage or acute infarction. Vascular: No hyperdense vessel or unexpected calcification. Skull: Normal. Negative for fracture or focal lesion. Sinuses/Orbits: Bilateral maxillary and left frontal sinusitis is noted. Other: None. IMPRESSION: Mild chronic ischemic white matter disease. No acute intracranial abnormality seen. Bilateral maxillary and left frontal sinusitis.  Electronically Signed   By: Lupita Raider M.D.   On: 07/16/2019 14:13   Dg Chest Port 1 View  Result Date: 07/16/2019 CLINICAL DATA:  from home due to the family calling when they were unable to get the pt to the door in order to open it and let them in. EMS states that the fire department broke down the door and found the pt in the bathtub. Pt states that he was getting ready for work when he started feeling weak and fell in the tub and hit his head. Pt does not think he lost consciousness. Hx of hypertension. Former smoker EXAM: PORTABLE CHEST 1 VIEW COMPARISON:  None. FINDINGS: Cardiac silhouette normal in size.  No mediastinal or hilar masses. There is opacity in the right lobe, some milder opacity at the left lung base and minimal area of opacity in the left mid lung. This may all be due to scarring. Consider multifocal infection if there are consistent clinical findings. Remainder of the lungs is clear. No pleural effusion or pneumothorax. Skeletal  structures are grossly intact. IMPRESSION: 1. Areas of lung opacity as detailed above may reflect chronic scarring. Consider multifocal pneumonia if there are consistent clinical findings. Otherwise, no evidence of acute cardiopulmonary disease. Electronically Signed   By: Amie Portland M.D.   On: 07/16/2019 13:59     ____________________________________________   PROCEDURES  Procedure(s) performed: None  Procedures  Critical Care performed: Yes, see critical care note(s)  CRITICAL CARE Performed by: Sharyn Creamer   Total critical care time: 30 minutes  Critical care time was exclusive of separately billable procedures and treating other patients.  Critical care was necessary to treat or prevent imminent or life-threatening deterioration.  Critical care was time spent personally by me on the following activities: development of treatment plan with patient and/or surrogate as well as nursing, discussions with consultants, evaluation of  patient's response to treatment, examination of patient, obtaining history from patient or surrogate, ordering and performing treatments and interventions, ordering and review of laboratory studies, ordering and review of radiographic studies, pulse oximetry and re-evaluation of patient's condition.  ____________________________________________   INITIAL IMPRESSION / ASSESSMENT AND PLAN / ED COURSE  Pertinent labs & imaging results that were available during my care of the patient were reviewed by me and considered in my medical decision making (see chart for details).   Patient presents after syncopal episode this hour, is associated with tachycardia and febrile.  Overall he appears relatively nontoxic but is tachycardic, slightly fatigued.  Denies pain.  Nexus negative for need for cervical imaging.  Full range of motion the neck without pain or discomfort.  No cervical tenderness.  Will work-up broadly as the source.  COVID-19 would be on his differential as well as many other etiologies.  Lawrence Barnett was evaluated in Emergency Department on 07/16/2019 for the symptoms described in the history of present illness. He was evaluated in the context of the global COVID-19 pandemic, which necessitated consideration that the patient might be at risk for infection with the SARS-CoV-2 virus that causes COVID-19. Institutional protocols and algorithms that pertain to the evaluation of patients at risk for COVID-19 are in a state of rapid change based on information released by regulatory bodies including the CDC and federal and state organizations. These policies and algorithms were followed during the patient's care in the ED.    Clinical Course as of Jul 15 1620  Thu Jul 16, 2019  1448 Spoke with patient's family, Annice Pih.  Patient gave me permission to speak to her, and she advised me that patient seemed confused earlier, was too weak to walk at all.  Patient had voiced that he had wanted to be  discharged, but after discussing with him and his family will admit him for further care and management for concerns of severe sepsis.   [MQ]  50 Family (nephew) called by me, he also agrees with admission to hospital and family spoke with patient via phone and patient understanding and agreeable with plan for admission.    [MQ]    Clinical Course User Index [MQ] Sharyn Creamer, MD   ----------------------------------------- 3:26 PM on 07/16/2019 -----------------------------------------  Sepsis reassessment completed, patient awake alert oriented.  Resting.  Lab work concerning for transaminitis, elevated lactic acid, elevated anion gap, and patient being covered empirically based on hospital sepsis algorithm for concern for probable pulmonary source given his infiltrative findings on chest x-ray.  Covid cannot yet be excluded.  Patient understanding, after discussing with his daughter and then discussing with the patient he is  understanding of plan to be admitted.  ____________________________________________   FINAL CLINICAL IMPRESSION(S) / ED DIAGNOSES  Final diagnoses:  Severe sepsis (HCC)  Syncope and collapse  Altered mental status, unspecified altered mental status type  Multilobar lung infiltrate        Note:  This document was prepared using Dragon voice recognition software and may include unintentional dictation errors       Sharyn CreamerQuale, Verbena Boeding, MD 07/16/19 1621

## 2019-07-16 NOTE — ED Notes (Signed)
Pt is dyspneic with no exertion and O2 sat is 88%-90% on 2L. Pt was placed on 4L.  This RN notes reddish raised hives on pts lower extermities. Pt st that this is normal and happens to him with he does not eat. MD notified

## 2019-07-16 NOTE — ED Notes (Signed)
Phlebotomy almost done collecting cultures from L fa.

## 2019-07-16 NOTE — Consult Note (Signed)
PHARMACY -  BRIEF ANTIBIOTIC NOTE   Pharmacy has received consult(s) for levofloxacin from an ED provider.  The patient's profile has been reviewed for ht/wt/allergies/indication/available labs.   Patient with anaphylaxis to PCN per [facility] records. No history of cephalosporin tolerance per chart.   One time order(s) placed for -levofloxacin 750 mg already ordered  Further antibiotics/pharmacy consults should be ordered by admitting physician if indicated.                       Thank you, [place] Resident 07/16/2019  2:37 PM

## 2019-07-16 NOTE — ED Notes (Signed)
Pt given food tray and drink per diet.

## 2019-07-16 NOTE — ED Notes (Signed)
Pt had a loose diarrhea BM and urine out put of 457ml

## 2019-07-16 NOTE — Progress Notes (Signed)
CODE SEPSIS - PHARMACY COMMUNICATION  **Broad Spectrum Antibiotics should be administered within 1 hour of Sepsis diagnosis**  Time Code Sepsis Called/Page Received: 1411  Antibiotics Ordered: levofloxacin  Time of 1st antibiotic administration: 1522  Additional action taken by pharmacy: n/a. Abx delayed due to drawing of cultures per chart.    [place] Resident 07/16/2019  2:44 PM

## 2019-07-16 NOTE — ED Triage Notes (Signed)
Pt arrival via [facility] from home due to the family calling when they were unable to get the pt to the door in order to open it and let them in. EMS states that the fire department broke down the door and found the pt in the bathtub.   Pt states that he was getting ready for work when he started feeling weak and fell in the tub and hit his head. Pt does not think he lost consciousness.   Pt denies pain at this moment.  VS in route with EMS: temp- 103.3, BP- 148/99, HR 136

## 2019-07-16 NOTE — ED Notes (Signed)
Pt using restroom. Will give med/hang fluids once pt finished.

## 2019-07-16 NOTE — H&P (Addendum)
[facility] H&P   Patient Demographics:    Lawrence Barnett, is a 68 y.o. male  MRN: [id number]   DOB - 11/09/1950  Admit Date - 07/16/2019  Outpatient Primary MD for the patient is Garnet KoyanagiJohnson, Julie R, FNP  Referring MD: Dr. Aura DialsQuail  Outpatient Specialists: None  Patient coming from: Home  Chief Complaint  Patient presents with  . Weakness  . Fall      HPI:    Lawrence Barnett  is a 68 y.o. male, with history of hypertension, alcohol use who was brought to the hospital by EMS after he had a syncopal episode at home.  Patient reports that he was in the shower getting ready to go to work when he became lightheaded and fell backwards.  He is not sure if he passed out.  He found himself on the floor.  EMS arrived and found him to be febrile and tachycardic.  Patient denies any symptoms prior to this episode, no fevers, chills, nausea, vomiting, shortness of breath, chest pain, palpitations, diarrhea, dysuria, abdominal pain, blurred vision or headache.  Denies any weakness, tingling or numbness of his extremities.  Denies being on any medications.  Denies any sick contact or recent travel outside of his regular job.  Denies recent illness or exposure to COVID-19.  In the ED patient found to be in severe sepsis with fever of 103 F, tachycardic to 120s and 130s, tachypneic in the 20s, maintaining O2 sat in the low 90s on room air.  Blood work showed WBC of 10, normal hemoglobin and platelets.  Chemistry showed potassium of 2.9, AKI with creatinine 1.93, significant transaminitis with total bili of 1.4 and AST ALT to  700s.  Lactic acid of 3.8.  Code sepsis initiated and ordered for 1 L normal saline bolus, IV Levaquin, blood and urine cultures ordered.  COVID-19 pending.  Chest x-ray shows findings of multifocal pneumonia.  EKG shows sinus tachycardia at 131 with APCs and poor R wave progression (no  recent EKG to compare).  CT head shows chronic ischemic changes with no acute findings. Hospitalist consulted for admission to telemetry for severe sepsis.      Review of systems:    In addition to the HPI above, No Fever-chills, No Headache, No changes with Vision or hearing, No problems swallowing food or Liquids, No Chest pain, Cough or Shortness of Breath, No Abdominal pain, No Nausea or vomiting, Bowel movements are regular, No Blood in stool or Urine, No dysuria, No new skin rashes or bruises, No new joints pains-aches,  No new weakness, tingling, numbness in any extremity, lightheadedness and syncope + No recent weight gain or loss, Polyuria +, no polydipsia polyphagia No significant Mental Stressors.     With Past History of the following :    Past Medical History:  Diagnosis Date  . Hypertension       No  past surgical history   Social History:     Social History   Tobacco Use  . Smoking status: Former Research scientist (life sciences)  . Smokeless tobacco: Never Used  Substance Use Topics  . Alcohol use: Yes  Reported drinking about 1 glass of whiskey daily  Lives -alone  Mobility -dependent     Family History :   No significant family history of heart disease or diabetes   Home Medications:   Prior to Admission medications   Not on File     Allergies:     Allergies  Allergen Reactions  . Penicillins Anaphylaxis    Per [facility] records  . Amlodipine Swelling  . Lisinopril Swelling  . Metoprolol Swelling  . Tylenol [Acetaminophen]      Physical Exam:   Vitals  Blood pressure (!) 150/103, pulse (!) 121, temperature (!) 103.1 F (39.5 C), temperature source Oral, resp. rate (!) 26, height 6' (1.829 m), weight 79.4 kg, SpO2 97 %.   Elderly male lying in bed in no acute distress HEENT: Pupils reactive bilaterally, EOMI, no pallor, no, mucosa, supple neck, no cervical lymphadenopathy Chest: Few scattered rhonchi bilaterally CVs: S1-S2 tachycardic, no  murmurs rub or gallop GI: Soft, nondistended, nontender, fine superficial erythema over the abdomen, bowel sounds present Musculoskeletal: Warm, no edema CNs: Alert and oriented, nonfocal, no tremors   Data Review:    CBC Recent Labs  Lab 07/16/19 1349  WBC 10.4  HGB 15.8  HCT 45.6  PLT 198  MCV 84.3  MCH 29.2  MCHC 34.6  RDW 13.4  LYMPHSABS 0.6*  MONOABS 0.4  EOSABS 0.0  BASOSABS 0.0   ------------------------------------------------------------------------------------------------------------------  Chemistries  Recent Labs  Lab 07/16/19 1349  NA 141  K 2.9*  CL 100  CO2 23  GLUCOSE 156*  BUN 38*  CREATININE 1.93*  CALCIUM 9.8  AST 716*  ALT 205*  ALKPHOS 49  BILITOT 1.4*   ------------------------------------------------------------------------------------------------------------------ estimated creatinine clearance is 39.1 mL/min (A) (by C-G formula based on SCr of 1.93 mg/dL (H)). ------------------------------------------------------------------------------------------------------------------ No results for input(s): TSH, T4TOTAL, T3FREE, THYROIDAB in the last 72 hours.  Invalid input(s): FREET3  Coagulation profile Recent Labs  Lab 07/16/19 1349  INR 1.1   ------------------------------------------------------------------------------------------------------------------- No results for input(s): DDIMER in the last 72 hours. -------------------------------------------------------------------------------------------------------------------  Cardiac Enzymes No results for input(s): CKMB, TROPONINI, MYOGLOBIN in the last 168 hours.  Invalid input(s): CK ------------------------------------------------------------------------------------------------------------------ No results found for: BNP   ---------------------------------------------------------------------------------------------------------------  Urinalysis No results found for:  COLORURINE, APPEARANCEUR, LABSPEC, PHURINE, GLUCOSEU, HGBUR, BILIRUBINUR, KETONESUR, PROTEINUR, UROBILINOGEN, NITRITE, LEUKOCYTESUR  ----------------------------------------------------------------------------------------------------------------   Imaging Results:    Ct Head Wo Contrast  Result Date: 07/16/2019 CLINICAL DATA:  Head injury after fall today. Uncertain loss of consciousness. EXAM: CT HEAD WITHOUT CONTRAST TECHNIQUE: Contiguous axial images were obtained from the base of the skull through the vertex without intravenous contrast. COMPARISON:  None. FINDINGS: Brain: Mild chronic ischemic white matter disease is noted. No mass effect or midline shift is noted. Ventricular size is within normal limits. There is no evidence of mass lesion, hemorrhage or acute infarction. Vascular: No hyperdense vessel or unexpected calcification. Skull: Normal. Negative for fracture or focal lesion. Sinuses/Orbits: Bilateral maxillary and left frontal sinusitis is noted. Other: None. IMPRESSION: Mild chronic ischemic white matter disease. No acute intracranial abnormality seen. Bilateral maxillary and left frontal sinusitis. Electronically Signed   By: Marijo Conception M.D.   On: 07/16/2019 14:13   Dg Chest Port 1 View  Result Date: 07/16/2019 CLINICAL DATA:  from home due to the family calling when they were unable to get the pt to the door in order to open it and let them in. EMS states that the fire department broke down the door and found the pt in the bathtub. Pt states that he was getting ready for work when he started feeling weak and fell in the tub and hit his head. Pt does not think he lost consciousness. Hx of hypertension. Former smoker EXAM: PORTABLE CHEST 1 VIEW COMPARISON:  None. FINDINGS: Cardiac silhouette normal in size.  No mediastinal or hilar masses. There is opacity in the right lobe, some milder opacity at the left lung base and minimal area of opacity in the left mid lung. This may all be  due to scarring. Consider multifocal infection if there are consistent clinical findings. Remainder of the lungs is clear. No pleural effusion or pneumothorax. Skeletal structures are grossly intact. IMPRESSION: 1. Areas of lung opacity as detailed above may reflect chronic scarring. Consider multifocal pneumonia if there are consistent clinical findings. Otherwise, no evidence of acute cardiopulmonary disease. Electronically Signed   By: Amie Portland M.D.   On: 07/16/2019 13:59    My personal review of EKG: Sinus tachycardia at 131, APCs with poor R wave progression   Assessment & Plan:    Active Problems:   Severe sepsis (HCC) Associated with ATN and transaminitis.  Differential includes multifocal pneumonia versus acute viral illness.  Cannot rule out COVID-19 infection.  Results pending. Sepsis pathway initiated in the ED.  1 L IV normal saline bolus received.  Will order further 2 L normal saline bolus and placed on maintenance fluid. Received IV Levaquin in the ED.  Will broaden antibiotic coverage by adding IV vancomycin and cefepime.  Follow blood cultures.  Check UA and urine culture. Admit to telemetry.  If sepsis does not improve clinically with fluid bolus may need stepdown monitoring.   Active problems  acute respiratory failure with hypoxia (HCC)  Lobar pneumonia (HCC)  As noted on chest x-ray.  Empiric IV vancomycin and cefepime in the setting of severe sepsis.  COVID-19 test pending.  2 L O2 via nasal cannula.  Acute kidney injury (HCC) Likely acute tubular necrosis in the setting of severe sepsis.  Avoid nephrotoxins.  Check UA and urine lites.  Acute transaminitis ?  Hepatic ischemia with sepsis versus acute viral hepatitis.  Check hepatitis panel and right upper quadrant ultrasound.  Syncope (HCC) EKG shows sinus tachycardia with poor R wave progression.  No recent EKG to compare.  Cycle troponins.  Check 2D echo.  Monitor on telemetry.  Alcohol abuse Reports having  only 1 drink per day but family told ED physician that he does drink more often.  Monitor on CIWA.  Hypokalemia Replenished.  Check magnesium    Patient status: Inpatient Patient presenting with severe sepsis with septic shock with target organ damage including ATN and transaminitis.  Patient is at high risk for rapid deterioration with the sepsis and worsening of his acute kidney injury.  He needs to be monitored on aggressive IV hydration, empiric antibiotics and further work-up which requires at least >2 midnights as inpatient.  DVT Prophylaxis: Subcu Lovenox  AM Labs Ordered, also please review Full Orders  Family Communication: Admission, patients condition and plan of care including tests being ordered have been discussed with the patient   Code Status full code  Likely DC to home  Condition GUARDED  Consults called: None    Time spent  in minutes : 43   Kurtiss Wence M.D on 07/16/2019 at 3:43 PM  Between 7am to 7pm - Pager - [phone]. After 7pm go to [email or web address] - password [facility]  [facility] - Office  [phone]

## 2019-07-16 NOTE — ED Notes (Addendum)
Provider Dhungel notified of critical troponin. No new orders.

## 2019-07-16 NOTE — ED Notes (Signed)
Phlebotomy at bedside.

## 2019-07-16 NOTE — ED Notes (Signed)
Pt was soild in urine and was cleaned and changed into dry gown placed onto new bed linen.

## 2019-07-16 NOTE — ED Notes (Signed)
Upon entering room to answer call bell, patient reports that he had trouble using urinal and had soaked himself and bed in urine. Patient's linen and bed pad changed. Wet clothes removed and gown placed on patient. Patient reattached to monitor and covered with blanket. Patient then proceeded to use urinal again with help of this RN.

## 2019-07-16 NOTE — ED Notes (Signed)
Pt keeps bending arms even post education so levofloxacin not yet finished running.

## 2019-07-17 ENCOUNTER — Inpatient Hospital Stay (HOSPITAL_COMMUNITY)
Admission: AD | Admit: 2019-07-17 | Discharge: 2019-07-23 | DRG: 871 | Disposition: A | Discharge status: Home / Self Care | Payer: Medicare Other | Source: Other Acute Inpatient Hospital | Attending: Internal Medicine | Admitting: Internal Medicine

## 2019-07-17 ENCOUNTER — Other Ambulatory Visit: Payer: Self-pay

## 2019-07-17 ENCOUNTER — Inpatient Hospital Stay: Payer: Medicare Other

## 2019-07-17 ENCOUNTER — Encounter (HOSPITAL_COMMUNITY): Payer: Self-pay

## 2019-07-17 DIAGNOSIS — Z888 Allergy status to other drugs, medicaments and biological substances status: Secondary | ICD-10-CM

## 2019-07-17 DIAGNOSIS — Z87891 Personal history of nicotine dependence: Secondary | ICD-10-CM

## 2019-07-17 DIAGNOSIS — R55 Syncope and collapse: Secondary | ICD-10-CM | POA: Diagnosis present

## 2019-07-17 DIAGNOSIS — N179 Acute kidney failure, unspecified: Secondary | ICD-10-CM | POA: Diagnosis present

## 2019-07-17 DIAGNOSIS — U071 COVID-19: Secondary | ICD-10-CM | POA: Diagnosis present

## 2019-07-17 DIAGNOSIS — A4189 Other specified sepsis: Secondary | ICD-10-CM | POA: Diagnosis present

## 2019-07-17 DIAGNOSIS — I10 Essential (primary) hypertension: Secondary | ICD-10-CM | POA: Diagnosis present

## 2019-07-17 DIAGNOSIS — E860 Dehydration: Secondary | ICD-10-CM | POA: Diagnosis present

## 2019-07-17 DIAGNOSIS — R6520 Severe sepsis without septic shock: Secondary | ICD-10-CM | POA: Diagnosis present

## 2019-07-17 DIAGNOSIS — J1289 Other viral pneumonia: Secondary | ICD-10-CM | POA: Diagnosis present

## 2019-07-17 DIAGNOSIS — R7402 Elevation of levels of lactic acid dehydrogenase (LDH): Secondary | ICD-10-CM | POA: Diagnosis present

## 2019-07-17 DIAGNOSIS — F1010 Alcohol abuse, uncomplicated: Secondary | ICD-10-CM

## 2019-07-17 DIAGNOSIS — R809 Proteinuria, unspecified: Secondary | ICD-10-CM | POA: Diagnosis present

## 2019-07-17 DIAGNOSIS — J1282 Pneumonia due to coronavirus disease 2019: Secondary | ICD-10-CM

## 2019-07-17 DIAGNOSIS — E872 Acidosis, unspecified: Secondary | ICD-10-CM | POA: Diagnosis present

## 2019-07-17 DIAGNOSIS — J9601 Acute respiratory failure with hypoxia: Secondary | ICD-10-CM | POA: Diagnosis present

## 2019-07-17 DIAGNOSIS — R319 Hematuria, unspecified: Secondary | ICD-10-CM | POA: Diagnosis present

## 2019-07-17 DIAGNOSIS — M6282 Rhabdomyolysis: Secondary | ICD-10-CM | POA: Diagnosis present

## 2019-07-17 DIAGNOSIS — K760 Fatty (change of) liver, not elsewhere classified: Secondary | ICD-10-CM | POA: Diagnosis present

## 2019-07-17 DIAGNOSIS — Z79899 Other long term (current) drug therapy: Secondary | ICD-10-CM

## 2019-07-17 DIAGNOSIS — Z880 Allergy status to penicillin: Secondary | ICD-10-CM

## 2019-07-17 DIAGNOSIS — R7401 Elevation of levels of liver transaminase levels: Secondary | ICD-10-CM | POA: Diagnosis not present

## 2019-07-17 DIAGNOSIS — A419 Sepsis, unspecified organism: Secondary | ICD-10-CM | POA: Diagnosis present

## 2019-07-17 DIAGNOSIS — E876 Hypokalemia: Secondary | ICD-10-CM | POA: Diagnosis present

## 2019-07-17 LAB — TYPE AND SCREEN
ABO/RH(D): B POS
Antibody Screen: NEGATIVE

## 2019-07-17 LAB — HEPATITIS PANEL, ACUTE
HCV Ab: NONREACTIVE
Hep A IgM: NONREACTIVE
Hep B C IgM: NONREACTIVE
Hepatitis B Surface Ag: NONREACTIVE

## 2019-07-17 LAB — COMPREHENSIVE METABOLIC PANEL
ALT: 171 U/L — ABNORMAL HIGH (ref 0–44)
AST: 540 U/L — ABNORMAL HIGH (ref 15–41)
Albumin: 3.4 g/dL — ABNORMAL LOW (ref 3.5–5.0)
Alkaline Phosphatase: 37 U/L — ABNORMAL LOW (ref 38–126)
Anion gap: 13 (ref 5–15)
BUN: 27 mg/dL — ABNORMAL HIGH (ref 8–23)
CO2: 16 mmol/L — ABNORMAL LOW (ref 22–32)
Calcium: 8.7 mg/dL — ABNORMAL LOW (ref 8.9–10.3)
Chloride: 118 mmol/L — ABNORMAL HIGH (ref 98–111)
Creatinine, Ser: 1.36 mg/dL — ABNORMAL HIGH (ref 0.61–1.24)
GFR calc Af Amer: >60 mL/min (ref >60)
GFR calc non Af Amer: 52 mL/min — ABNORMAL LOW (ref >60)
Glucose, Bld: 112 mg/dL — ABNORMAL HIGH (ref 70–99)
Potassium: 3.1 mmol/L — ABNORMAL LOW (ref 3.5–5.1)
Sodium: 147 mmol/L — ABNORMAL HIGH (ref 135–145)
Total Bilirubin: 1 mg/dL (ref 0.3–1.2)
Total Protein: 7.9 g/dL (ref 6.5–8.1)

## 2019-07-17 LAB — C-REACTIVE PROTEIN: CRP: 19.5 mg/dL — ABNORMAL HIGH (ref <1.0)

## 2019-07-17 LAB — CBC
HCT: 46.4 % (ref 39.0–52.0)
Hemoglobin: 15.3 g/dL (ref 13.0–17.0)
MCH: 29.4 pg (ref 26.0–34.0)
MCHC: 33 g/dL (ref 30.0–36.0)
MCV: 89.1 fL (ref 80.0–100.0)
Platelets: 180 10*3/uL (ref 150–400)
RBC: 5.21 MIL/uL (ref 4.22–5.81)
RDW: 13.9 % (ref 11.5–15.5)
WBC: 4.4 10*3/uL (ref 4.0–10.5)
nRBC: 0 % (ref 0.0–0.2)

## 2019-07-17 LAB — ABO/RH: ABO/RH(D): B POS

## 2019-07-17 LAB — FIBRIN DERIVATIVES D-DIMER (ARMC ONLY): Fibrin derivatives D-dimer (ARMC): 3064.3 ng/mL (FEU) — ABNORMAL HIGH (ref 0.00–499.00)

## 2019-07-17 LAB — MRSA PCR SCREENING: MRSA by PCR: NEGATIVE

## 2019-07-17 LAB — SARS CORONAVIRUS 2 (TAT 6-24 HRS): SARS Coronavirus 2: POSITIVE — AB

## 2019-07-17 LAB — HIV ANTIBODY (ROUTINE TESTING W REFLEX): HIV Screen 4th Generation wRfx: NONREACTIVE

## 2019-07-17 MED ORDER — METHYLPREDNISOLONE SODIUM SUCC 40 MG IJ SOLR
40.0000 mg | Freq: Two times a day (BID) | INTRAMUSCULAR | Status: DC
  Administered 2019-07-17: 40 mg via INTRAVENOUS

## 2019-07-17 MED ORDER — ADULT MULTIVITAMIN W/MINERALS CH
1.0000 | ORAL_TABLET | Freq: Every day | ORAL | Status: DC
  Administered 2019-07-17 – 2019-07-23 (×7): 1 via ORAL
  Filled 2019-07-17 (×7): qty 1

## 2019-07-17 MED ORDER — DIPHENHYDRAMINE HCL 50 MG/ML IJ SOLN: 25.0000 mg | Freq: Four times a day (QID) | INTRAMUSCULAR | Status: DC | PRN

## 2019-07-17 MED ORDER — LORAZEPAM 0.5 MG PO TABS: 1.0000 mg | ORAL_TABLET | ORAL | Status: AC | PRN

## 2019-07-17 MED ORDER — LORAZEPAM 2 MG/ML IJ SOLN: 1.0000 mg | INTRAMUSCULAR | Status: AC | PRN

## 2019-07-17 MED ORDER — ACETAMINOPHEN 325 MG PO TABS: 650.0000 mg | ORAL_TABLET | Freq: Four times a day (QID) | ORAL | Status: DC | PRN

## 2019-07-17 MED ORDER — VITAMIN B-1 100 MG PO TABS
100.0000 mg | ORAL_TABLET | Freq: Every day | ORAL | Status: DC
  Administered 2019-07-17: 19:00:00 100 mg via ORAL
  Filled 2019-07-17: qty 1

## 2019-07-17 MED ORDER — HYDRALAZINE HCL 50 MG PO TABS
50.0000 mg | ORAL_TABLET | Freq: Three times a day (TID) | ORAL | Status: DC
  Administered 2019-07-17 – 2019-07-23 (×15): 50 mg via ORAL
  Filled 2019-07-17 (×18): qty 1

## 2019-07-17 MED ORDER — SODIUM CHLORIDE 0.9 % IV SOLN
100.0000 mg | INTRAVENOUS | Status: AC
  Administered 2019-07-18 – 2019-07-21 (×4): 100 mg via INTRAVENOUS
  Filled 2019-07-17 (×4): qty 100

## 2019-07-17 MED ORDER — LOPERAMIDE HCL 2 MG PO CAPS
2.0000 mg | ORAL_CAPSULE | Freq: Four times a day (QID) | ORAL | Status: DC | PRN
  Administered 2019-07-18: 2 mg via ORAL
  Filled 2019-07-17: qty 1

## 2019-07-17 MED ORDER — ALBUTEROL SULFATE HFA 108 (90 BASE) MCG/ACT IN AERS
2.0000 | INHALATION_SPRAY | Freq: Four times a day (QID) | RESPIRATORY_TRACT | Status: DC | PRN
  Filled 2019-07-17: qty 6.7

## 2019-07-17 MED ORDER — FOLIC ACID 1 MG PO TABS
1.0000 mg | ORAL_TABLET | Freq: Every day | ORAL | Status: DC
  Administered 2019-07-17 – 2019-07-23 (×7): 1 mg via ORAL
  Filled 2019-07-17 (×7): qty 1

## 2019-07-17 MED ORDER — LORAZEPAM 2 MG/ML IJ SOLN
0.0000 mg | Freq: Four times a day (QID) | INTRAMUSCULAR | Status: DC
  Filled 2019-07-17: qty 1

## 2019-07-17 MED ORDER — METHYLPREDNISOLONE SODIUM SUCC 40 MG IJ SOLR: 40.0000 mg | Freq: Two times a day (BID) | INTRAMUSCULAR | 0 refills | Status: DC

## 2019-07-17 MED ORDER — LORAZEPAM 2 MG/ML IJ SOLN: 0.0000 mg | Freq: Two times a day (BID) | INTRAMUSCULAR | Status: DC

## 2019-07-17 MED ORDER — METHYLPREDNISOLONE SODIUM SUCC 125 MG IJ SOLR
60.0000 mg | Freq: Two times a day (BID) | INTRAMUSCULAR | Status: DC
  Administered 2019-07-17 – 2019-07-18 (×2): 60 mg via INTRAVENOUS
  Filled 2019-07-17 (×2): qty 2

## 2019-07-17 MED ORDER — POTASSIUM CHLORIDE 2 MEQ/ML IV SOLN
INTRAVENOUS | Status: DC
  Administered 2019-07-17: 19:00:00 via INTRAVENOUS
  Filled 2019-07-17 (×2): qty 1000

## 2019-07-17 MED ORDER — ONDANSETRON HCL 4 MG PO TABS: 4.0000 mg | ORAL_TABLET | Freq: Four times a day (QID) | ORAL | Status: DC | PRN

## 2019-07-17 MED ORDER — SODIUM CHLORIDE 0.9% FLUSH
3.0000 mL | Freq: Two times a day (BID) | INTRAVENOUS | Status: DC
  Administered 2019-07-17 – 2019-07-23 (×10): 3 mL via INTRAVENOUS

## 2019-07-17 MED ORDER — SODIUM CHLORIDE 0.9 % IV SOLN
200.0000 mg | Freq: Once | INTRAVENOUS | Status: AC
  Administered 2019-07-17: 200 mg via INTRAVENOUS
  Filled 2019-07-17: qty 40

## 2019-07-17 MED ORDER — VANCOMYCIN HCL 1.25 G IV SOLR
1250.0000 mg | INTRAVENOUS | Status: DC
  Filled 2019-07-17: qty 1250

## 2019-07-17 MED ORDER — ONDANSETRON HCL 4 MG/2ML IJ SOLN: 4.0000 mg | Freq: Four times a day (QID) | INTRAMUSCULAR | Status: DC | PRN

## 2019-07-17 MED ORDER — POTASSIUM CHLORIDE CRYS ER 20 MEQ PO TBCR
40.0000 meq | EXTENDED_RELEASE_TABLET | Freq: Once | ORAL | Status: AC
  Administered 2019-07-17: 40 meq via ORAL
  Filled 2019-07-17: qty 2

## 2019-07-17 MED ORDER — LACTATED RINGERS IV SOLN: INTRAVENOUS | Status: DC

## 2019-07-17 MED ORDER — THIAMINE HCL 100 MG/ML IJ SOLN
100.0000 mg | Freq: Every day | INTRAMUSCULAR | Status: DC
  Filled 2019-07-17: qty 1

## 2019-07-17 MED ORDER — ENOXAPARIN SODIUM 40 MG/0.4ML ~~LOC~~ SOLN
40.0000 mg | SUBCUTANEOUS | Status: DC
  Administered 2019-07-18 – 2019-07-23 (×6): 40 mg via SUBCUTANEOUS
  Filled 2019-07-17 (×6): qty 0.4

## 2019-07-17 NOTE — ED Notes (Signed)
Pt was changed (2) due to

## 2019-07-17 NOTE — ED Notes (Signed)
Attending MD notified of loose stool. No new orders at this time.

## 2019-07-17 NOTE — ED Notes (Signed)
Pt incontinent of urine and watery stool. Assisted pt with cleaning, repositioning, and provided for safety and comfort. Iv fluids infusing without difficulty. Pt states he is feeling much better. Pt did not eat any of the [place] sandwich tray that was provided. Pt alert and calm with no distress noted. Will continue to assess.

## 2019-07-17 NOTE — ED Notes (Signed)
PO meds will not be given, pt is NPO until after ultrasounds results come back.

## 2019-07-17 NOTE — ED Notes (Signed)
Locked out of sunquest, unable to print yellow labels. CMP repeat draw sent with chart label with date/time/initials printed on sticker. Lab notified.

## 2019-07-17 NOTE — Progress Notes (Signed)
   07/17/19 1700  Family/Significant Other Communication  Family/Significant Other Update Called;Updated (sister)

## 2019-07-17 NOTE — Plan of Care (Signed)
New admit today. On 2LNC. No acute distress. Consented to plasma. A&O x4.  Problem: Education: Goal: Knowledge of risk factors and measures for prevention of condition will improve Outcome: Progressing   Problem: Coping: Goal: Psychosocial and spiritual needs will be supported Outcome: Progressing   Problem: Respiratory: Goal: Will maintain a patent airway Outcome: Progressing Goal: Complications related to the disease process, condition or treatment will be avoided or minimized Outcome: Progressing

## 2019-07-17 NOTE — Progress Notes (Signed)
Pt was tx from [facility] for [place]. He got the loading dose of remdesivir there. He is on O2 support and a positive CXR.  His ALT is elevated 171.  Cont remdesivir 100mg  IV q24 x4 more  Onnie Boer, PharmD, [place], AAHIVP, CPP Infectious Disease Pharmacist 07/17/2019 6:11 PM

## 2019-07-17 NOTE — ED Provider Notes (Signed)
Patient accepted to [facility]. [facility] en route. Labs, vitals reviewed. EMTALA completed by Hospitalist, stable here for transfer.   Duffy Bruce, MD 07/17/19 1537

## 2019-07-17 NOTE — ED Notes (Signed)
Pt voided and defecated in his brief. Pt was placed in a new brief & linen change by this narrator and Andrea, RN. Pt's stool is light yellow and watery. Pt given new blankets and reports he is comfortable and in no pain. Will continue hourly rounds  

## 2019-07-17 NOTE — ED Notes (Signed)
Ultrasound at bedside

## 2019-07-17 NOTE — ED Notes (Signed)
Pt incontinent of stool and urine. Assisted with cleaning pt and provided for comfort and safety. Iv fluids infusing without difficulty. Pt alert and calm and states he is feeling better. Will continue to assess.

## 2019-07-17 NOTE — ED Notes (Signed)
PER DR. DHUNGEL> PATIENT IS TO BE NPO AS OF 12am ON 07/17/19 : UNTIL ABDOMINAL ULTRASOUND IS COMPLETE & RESULTED PER PROTOCOL

## 2019-07-17 NOTE — Progress Notes (Signed)
Remdesivir - Pharmacy Brief Note   O:  ALT: 205 >> 171 CXR: Right lobe opacity, mild opacity at left lung base, minimal opacity in left mid lung SpO2: 95-100% on 2L [place]   A/P:  COVID-19 test resulted positive 11/13 @ 1243. ALT <220 and down-trending so patient is a candidate for remdesivir.   Remdesivir 200 mg IVPB once followed by 100 mg IVPB daily x 4 days.   [place] Resident 07/17/2019 1:06 PM

## 2019-07-17 NOTE — ED Notes (Signed)
Pt voided and defecated in his brief. Pt was placed in a new brief & linen change by this narrator and Seth Bake, Therapist, sports. Pt's stool is light yellow and watery. Pt given new blankets and reports he is comfortable and in no pain. Will continue hourly rounds

## 2019-07-17 NOTE — Consult Note (Signed)
Pharmacy Antibiotic Note  Lawrence Barnett is a 68 y.o. male admitted on 07/16/2019 with sepsis. Patient BIBEMS after patient was found at home in bathtub after falling and hitting his head. He was found to be febrile, hypertensive, and tachycardic, and tachypneic. Pharmacy has been consulted for vancomycin and cefepime dosing.   He received levofloxacin in ED due to history of PCN allergy. Spoke with patient regarding allergy. He reports it occurred when he was a teenager and his reaction was shortness of breath not requiring hospitalization. Cross reactivity to a fourth-generation cephalosporin is un-likely due to presence of different side chains. MD ok with proceeding.  Creatinine on admission elevated to 1.93 from 1.1 in 12/2018 per [facility] records. Temp in ED 103.1. PCT 21.06. Lactate 3.8. WBC 10.4, mild left shift. LFTs elevated.   11/13: Patient's creatinine has improved from 1.93 to 1.5. It looks as though baseline is roughly 1-1.1. Will modify Vancomycin dosing and continue to follow renal function.  Plan: 1) continue Cefepime 2 g q12h  2) Vancomycin 1750 mg LD was given in ED. Will modify Vancomycin maintenance dose to 1250 mg IV Q 24 hrs. Goal AUC 400-550. Expected AUC: 473.8, trough 11.6 SCr used: 1.5  Continue to follow daily Scr per protocol and adjust antibiotics as indicated. Follow culture results and narrow antibiotics as appropriate.  Height: 6' (182.9 cm) Weight: 175 lb (79.4 kg) IBW/kg (Calculated) : 77.6  Temp (24hrs), Avg:101.5 F (38.6 C), Min:99.6 F (37.6 C), Max:103.1 F (39.5 C)  Recent Labs  Lab 07/16/19 1349 07/16/19 1458 07/16/19 1902 07/16/19 2103 07/16/19 2106  WBC 10.4  --   --   --  8.0  CREATININE 1.93*  --   --   --  1.50*  LATICACIDVEN 3.8* 3.9* 2.4* 2.2*  --     Estimated Creatinine Clearance: 50.3 mL/min (A) (by C-G formula based on SCr of 1.5 mg/dL (H)).    Allergies  Allergen Reactions  . Penicillins Shortness Of Breath    Patient  reports reaction occurred as a teenager and he experienced shortness of breath not requiring hospitalization.   . Amlodipine Swelling  . Lisinopril Swelling  . Metoprolol Swelling  . Tylenol [Acetaminophen]     Antimicrobials this admission: Levofloxacin x 1 Vancomycin 11/12 >> Cefepime 11/12 >>  Dose adjustments this admission: n/a  Microbiology results: 11/12 BCx: NGTD 11/12 UCx: pending 11/12 COVID-19: pending  Thank you for allowing pharmacy to be a part of this patient's care.  [place] Resident 07/17/2019 9:44 AM

## 2019-07-17 NOTE — ED Notes (Signed)
Positive COVID resulted reported to MD

## 2019-07-17 NOTE — ED Notes (Signed)
Pt requested that his niece Swan Zayed be notified of plan of care. Call attempted, no answer at this time.

## 2019-07-17 NOTE — ED Notes (Signed)
Pt brief and linens changed. Pt had a small amount of stool in brief, stool was loose and yellow. Pt denies any further needs at this time.

## 2019-07-17 NOTE — Discharge Summary (Addendum)
Physician Discharge Summary  [name] ASN:[id number] DOB: [birth year] DOA: 07/16/2019  PCP: Lawrence Rob, FNP  Admit date: 07/16/2019 Discharge date: 07/17/2019  Admitted From: Home Disposition: Transfer to [facility]  Recommendations for Outpatient Follow-up:  1. Discharge to [place]: None Equipment/Devices: None  Discharge Condition: Fair CODE STATUS: Full code Diet recommendation: Regular     Discharge Diagnoses:  Principal Problem:   Severe sepsis ([place])   Active Problems:   COVID-19 virus infection   ATN (acute tubular necrosis) (HCC)   Lobar pneumonia (HCC)   Transaminitis   Alcohol abuse   Hypokalemia   Syncope and collapse   Metabolic acidosis  Brief narrative/HPI Lawrence Barnett  is a 68 y.o. male, with history of hypertension, alcohol use who was brought to the hospital by EMS after he had a syncopal episode at home.  Patient reports that he was in the shower getting ready to go to work when he became lightheaded and fell backwards.  He is not sure if he passed out.  He found himself on the floor.  EMS arrived and found him to be febrile and tachycardic.  Patient denies any symptoms prior to this episode, no fevers, chills, nausea, vomiting, shortness of breath, chest pain, palpitations, diarrhea, dysuria, abdominal pain, blurred vision or headache.  Denies any weakness, tingling or numbness of his extremities.  Denies being on any medications.  Denies any sick contact or recent travel outside of his regular job.  Denies recent illness or exposure to COVID-19.  In the ED patient found to be in severe sepsis with fever of 103 F, tachycardic to 120s and 130s, tachypneic in the 20s, maintaining O2 sat in the low 90s on room air.  Blood work showed WBC of 10, normal hemoglobin and platelets.  Chemistry showed potassium of 2.9, AKI with creatinine 1.93, significant transaminitis with total bili of 1.4 and AST ALT to  700s.   Lactic acid of 3.8.  Code sepsis initiated and ordered for 1 L normal saline bolus, IV Levaquin, blood and urine cultures ordered.  COVID-19 pending.  Chest x-ray shows findings of multifocal pneumonia.  EKG shows sinus tachycardia at 131 with APCs and poor R wave progression (no recent EKG to compare).  CT head shows chronic ischemic changes with no acute findings. Hospitalist consulted for admission to telemetry for severe sepsis.  Hospital course  Active Problems:   Severe sepsis ([place]) Acute respiratory failure with hypoxia, ruled in Associated with ATN and transaminitis.    Multifocal pneumonia secondary to COVID-19 infection.  Received aggressive IV hydration and currently on maintenance fluid.  Afebrile today. Antibiotic discontinued as COVID-19 tested positive. Follow blood culture and urine culture. Patient started on IV Solu-Medrol and remdesivir (received 1 dose today). Markedly elevated D-dimer and CRP.  Trend levels today    Active problems Multifocal pneumonia (HCC)  Likely secondary to COVID-19 infection.  Was started on empiric vancomycin and cefepime which has been discontinued.  Maintaining sats on 2 L via nasal cannula   Acute kidney injury ([place]) Likely acute tubular necrosis in the setting of severe sepsis.  Avoid nephrotoxins.    Improving with fluids.  Acute transaminitis Likely combination of hepatic ischemia with sepsis and alcoholic liver disease given markedly elevated AST.  Since ALT is trending down and less than 220, started on remdesivir.  Syncope ([place]) EKG shows sinus tachycardia with poor R wave progression.  No recent EKG to compare.    Noted for  elevated troponin but denies any chest pain symptoms.  2D echo ordered and pending.   Alcohol abuse Reports having only 1 drink per day but family told ED physician that he does drink more often.  Being monitored on CIWA.  No signs of active withdrawal.  Hypokalemia Replenished.    Metabolic  acidosis Monitor with IV fluids  Diarrhea Reportedly having fever which was a watery diarrhea this morning.  No blood or mucus.  Monitor for now.  [facility]  Family Communication:  None    Condition GUARDED  Consults called: None    Time spent in minutes : 70  Discharge Instructions   Allergies as of 07/17/2019      Reactions   Penicillins Shortness Of Breath   Patient reports reaction occurred as a teenager and he experienced shortness of breath not requiring hospitalization.    Amlodipine Swelling   Lisinopril Swelling   Metoprolol Swelling   Tylenol [acetaminophen]       Medication List    TAKE these medications   methylPREDNISolone sodium succinate 40 mg/mL injection Commonly known as: SOLU-MEDROL Inject 1 mL (40 mg total) into the vein every 12 (twelve) hours.      Follow-up Information    [facility] at [facility] Follow up.          Allergies  Allergen Reactions  . Penicillins Shortness Of Breath    Patient reports reaction occurred as a teenager and he experienced shortness of breath not requiring hospitalization.   . Amlodipine Swelling  . Lisinopril Swelling  . Metoprolol Swelling  . Tylenol [Acetaminophen]         Procedures/Studies: Ct Head Wo Contrast  Result Date: 07/16/2019 CLINICAL DATA:  Head injury after fall today. Uncertain loss of consciousness. EXAM: CT HEAD WITHOUT CONTRAST TECHNIQUE: Contiguous axial images were obtained from the base of the skull through the vertex without intravenous contrast. COMPARISON:  None. FINDINGS: Brain: Mild chronic ischemic white matter disease is noted. No mass effect or midline shift is noted. Ventricular size is within normal limits. There is no evidence of mass lesion, hemorrhage or acute infarction. Vascular: No hyperdense vessel or unexpected calcification. Skull: Normal. Negative for fracture or focal lesion. Sinuses/Orbits: Bilateral maxillary and left frontal sinusitis is  noted. Other: None. IMPRESSION: Mild chronic ischemic white matter disease. No acute intracranial abnormality seen. Bilateral maxillary and left frontal sinusitis. Electronically Signed   By: Lupita RaiderJames  Green Jr M.D.   On: 07/16/2019 14:13   [place] Abdomen Complete  Result Date: 07/17/2019 CLINICAL DATA:  Transaminitis. EXAM: ABDOMEN ULTRASOUND COMPLETE COMPARISON:  None. FINDINGS: Gallbladder: The gallbladder is partially contracted. No gallstones or wall thickening visualized. No sonographic Murphy sign noted by sonographer. Common bile duct: Diameter: 0.3 cm Liver: No focal lesion is identified. The liver is dense with increased echogenicity. Portal vein is patent on color Doppler imaging with normal direction of blood flow towards the liver. IVC: No abnormality visualized. Pancreas: Not visualized due to overlying bowel gas. Spleen: Size and appearance within normal limits. Right Kidney: Length: 10.3 cm. Echogenicity within normal limits. No solid mass or hydronephrosis visualized. Simple 2.5 cm cyst noted. Left Kidney: Length: . 12.3 cm. Echogenicity within normal limits. No solid mass or hydronephrosis visualized. Two contiguous cysts versus a cyst with a single thin septation measuring 5.3 cm noted. Abdominal aorta: No aneurysm visualized. Other findings: None. IMPRESSION: No acute abnormality. Diffuse fatty infiltration of the liver. Electronically Signed   By: Drusilla Kannerhomas  Dalessio M.D.  On: 07/17/2019 11:48   Dg Chest Port 1 View  Result Date: 07/16/2019 CLINICAL DATA:  from home due to the family calling when they were unable to get the pt to the door in order to open it and let them in. EMS states that the fire department broke down the door and found the pt in the bathtub. Pt states that he was getting ready for work when he started feeling weak and fell in the tub and hit his head. Pt does not think he lost consciousness. Hx of hypertension. Former smoker EXAM: PORTABLE CHEST 1 VIEW COMPARISON:  None.  FINDINGS: Cardiac silhouette normal in size.  No mediastinal or hilar masses. There is opacity in the right lobe, some milder opacity at the left lung base and minimal area of opacity in the left mid lung. This may all be due to scarring. Consider multifocal infection if there are consistent clinical findings. Remainder of the lungs is clear. No pleural effusion or pneumothorax. Skeletal structures are grossly intact. IMPRESSION: 1. Areas of lung opacity as detailed above may reflect chronic scarring. Consider multifocal pneumonia if there are consistent clinical findings. Otherwise, no evidence of acute cardiopulmonary disease. Electronically Signed   By: Amie Portland M.D.   On: 07/16/2019 13:59       Subjective: Reports breathing to be somewhat better since yesterday.  Discharge Exam: Vitals:   07/17/19 1230 07/17/19 1300  BP: 126/81 112/81  Pulse:  (!) 106  Resp: (!) 36 (!) 34  Temp:    SpO2:  96%   Vitals:   07/17/19 0930 07/17/19 1130 07/17/19 1230 07/17/19 1300  BP: 113/60 (!) 134/102 126/81 112/81  Pulse: (!) 53 (!) 108  (!) 106  Resp: (!) 34 (!) 34 (!) 36 (!) 34  Temp:      TempSrc:      SpO2: 95% 96%  96%  Weight:      Height:        General: Elderly male not in distress HEENT: Moist mucosa, supple neck Chest: Scattered rhonchi bilaterally, no crackles or wheezing CVs: S1-S2 tachycardic (110s), no murmurs GI: Soft, nondistended or nontender, erythematous markings over the abdominal wall compared to yesterday has improved Musculoskeletal: Warm, no edema CNS: Alert and oriented, no tremors    The results of significant diagnostics from this hospitalization (including imaging, microbiology, ancillary and laboratory) are listed below for reference.     Microbiology: Recent Results (from the past 240 hour(s))  Culture, blood (Routine x 2)     Status: None (Preliminary result)   Collection Time: 07/16/19  1:50 PM   Specimen: BLOOD  Result Value Ref Range Status    Specimen Description BLOOD RIGHT ANTECUBITAL  Final   Special Requests   Final    BOTTLES DRAWN AEROBIC AND ANAEROBIC Blood Culture adequate volume   Culture   Final    NO GROWTH < 24 HOURS Performed at [facility], [address] [place], [place] [address]    Report Status PENDING  Incomplete  Culture, blood (Routine x 2)     Status: None (Preliminary result)   Collection Time: 07/16/19  2:58 PM   Specimen: BLOOD  Result Value Ref Range Status   Specimen Description BLOOD BLOOD RIGHT HAND  Final   Special Requests   Final    BOTTLES DRAWN AEROBIC AND ANAEROBIC Blood Culture results may not be optimal due to an inadequate volume of blood received in culture bottles   Culture   Final  NO GROWTH < 24 HOURS Performed at [facility], [address] [place], [place] [address]    Report Status PENDING  Incomplete  SARS CORONAVIRUS 2 (TAT 6-24 HRS) Nasopharyngeal Nasopharyngeal Swab     Status: Abnormal   Collection Time: 07/16/19  3:12 PM   Specimen: Nasopharyngeal Swab  Result Value Ref Range Status   SARS Coronavirus 2 POSITIVE (A) NEGATIVE Final    Comment: RESULT CALLED TO, READ BACK BY AND VERIFIED WITH: A.MCCULLEY EN @ 1210 07/17/2019 BY SANDY (NOTE) SARS-CoV-2 target nucleic acids are DETECTED. The SARS-CoV-2 RNA is generally detectable in upper and lower respiratory specimens during the acute phase of infection. Positive results are indicative of active infection with SARS-CoV-2. Clinical  correlation with patient history and other diagnostic information is necessary to determine patient infection status. Positive results do  not rule out bacterial infection or co-infection with other viruses. The expected result is Negative. Fact Sheet for Patients: HairSlick.no Fact Sheet for Healthcare Providers: quierodirigir.com This test is not yet approved or cleared by the [place] FDA and  has been  authorized for detection and/or diagnosis of SARS-CoV-2 by FDA under an Emergency Use Authorization (EUA). This EUA will remain  in effect (meaning this test can be used)  for the duration of the COVID-19 declaration under Section 564(b)(1) of the Act, 21 U.S.C. section 360bbb-3(b)(1), unless the authorization is terminated or revoked sooner. Performed at [facility], [address] [place], [place] [address]      Labs: BNP (last 3 results) No results for input(s): BNP in the last 8760 hours. Basic Metabolic Panel: Recent Labs  Lab 07/16/19 1349 07/16/19 2106 07/17/19 1201  NA 141  --  147*  K 2.9*  --  3.1*  CL 100  --  118*  CO2 23  --  16*  GLUCOSE 156*  --  112*  BUN 38*  --  27*  CREATININE 1.93* 1.50* 1.36*  CALCIUM 9.8  --  8.7*   Liver Function Tests: Recent Labs  Lab 07/16/19 1349 07/17/19 1201  AST 716* 540*  ALT 205* 171*  ALKPHOS 49 37*  BILITOT 1.4* 1.0  PROT 9.0* 7.9  ALBUMIN 4.2 3.4*   No results for input(s): LIPASE, AMYLASE in the last 168 hours. Recent Labs  Lab 07/16/19 1458  AMMONIA 25   CBC: Recent Labs  Lab 07/16/19 1349 07/16/19 2106  WBC 10.4 8.0  NEUTROABS 9.4*  --   HGB 15.8 14.2  HCT 45.6 41.1  MCV 84.3 84.2  PLT 198 159   Cardiac Enzymes: No results for input(s): CKTOTAL, CKMB, CKMBINDEX, TROPONINI in the last 168 hours. BNP: Invalid input(s): POCBNP CBG: No results for input(s): GLUCAP in the last 168 hours. D-Dimer No results for input(s): DDIMER in the last 72 hours. Hgb A1c No results for input(s): HGBA1C in the last 72 hours. Lipid Profile No results for input(s): CHOL, HDL, LDLCALC, TRIG, CHOLHDL, LDLDIRECT in the last 72 hours. Thyroid function studies No results for input(s): TSH, T4TOTAL, T3FREE, THYROIDAB in the last 72 hours.  Invalid input(s): FREET3 Anemia work up No results for input(s): VITAMINB12, FOLATE, FERRITIN, TIBC, IRON, RETICCTPCT in the last 72 hours. Urinalysis    Component  Value Date/Time   COLORURINE YELLOW (A) 07/16/2019 1512   APPEARANCEUR CLEAR (A) 07/16/2019 1512   LABSPEC 1.011 07/16/2019 1512   PHURINE 6.0 07/16/2019 1512   GLUCOSEU NEGATIVE 07/16/2019 1512   HGBUR LARGE (A) 07/16/2019 1512   BILIRUBINUR NEGATIVE 07/16/2019  1512   KETONESUR NEGATIVE 07/16/2019 1512   PROTEINUR 100 (A) 07/16/2019 1512   NITRITE NEGATIVE 07/16/2019 1512   LEUKOCYTESUR NEGATIVE 07/16/2019 1512   Sepsis Labs Invalid input(s): PROCALCITONIN,  WBC,  LACTICIDVEN Microbiology Recent Results (from the past 240 hour(s))  Culture, blood (Routine x 2)     Status: None (Preliminary result)   Collection Time: 07/16/19  1:50 PM   Specimen: BLOOD  Result Value Ref Range Status   Specimen Description BLOOD RIGHT ANTECUBITAL  Final   Special Requests   Final    BOTTLES DRAWN AEROBIC AND ANAEROBIC Blood Culture adequate volume   Culture   Final    NO GROWTH < 24 HOURS Performed at [facility], [address] [place], [place] [address]    Report Status PENDING  Incomplete  Culture, blood (Routine x 2)     Status: None (Preliminary result)   Collection Time: 07/16/19  2:58 PM   Specimen: BLOOD  Result Value Ref Range Status   Specimen Description BLOOD BLOOD RIGHT HAND  Final   Special Requests   Final    BOTTLES DRAWN AEROBIC AND ANAEROBIC Blood Culture results may not be optimal due to an inadequate volume of blood received in culture bottles   Culture   Final    NO GROWTH < 24 HOURS Performed at [facility], [address] [place], [place] [address]    Report Status PENDING  Incomplete  SARS CORONAVIRUS 2 (TAT 6-24 HRS) Nasopharyngeal Nasopharyngeal Swab     Status: Abnormal   Collection Time: 07/16/19  3:12 PM   Specimen: Nasopharyngeal Swab  Result Value Ref Range Status   SARS Coronavirus 2 POSITIVE (A) NEGATIVE Final    Comment: RESULT CALLED TO, READ BACK BY AND VERIFIED WITH: A.MCCULLEY EN @ 1210 07/17/2019 BY SANDY (NOTE) SARS-CoV-2  target nucleic acids are DETECTED. The SARS-CoV-2 RNA is generally detectable in upper and lower respiratory specimens during the acute phase of infection. Positive results are indicative of active infection with SARS-CoV-2. Clinical  correlation with patient history and other diagnostic information is necessary to determine patient infection status. Positive results do  not rule out bacterial infection or co-infection with other viruses. The expected result is Negative. Fact Sheet for Patients: HairSlick.no Fact Sheet for Healthcare Providers: quierodirigir.com This test is not yet approved or cleared by the [place] FDA and  has been authorized for detection and/or diagnosis of SARS-CoV-2 by FDA under an Emergency Use Authorization (EUA). This EUA will remain  in effect (meaning this test can be used)  for the duration of the COVID-19 declaration under Section 564(b)(1) of the Act, 21 U.S.C. section 360bbb-3(b)(1), unless the authorization is terminated or revoked sooner. Performed at [facility], [address], [place], [place] [address]      Time coordinating discharge: 35 minutes  SIGNED:   Eddie North, MD  [facility] 07/17/2019, 1:34 PM Pager   If 7PM-7AM, please contact night-coverage [email or web address] Password [email or web address]

## 2019-07-17 NOTE — ED Notes (Signed)
[facility] present to transport pt to [facility].

## 2019-07-17 NOTE — Plan of Care (Signed)
  Problem: Education: Goal: Knowledge of risk factors and measures for prevention of condition will improve Outcome: Progressing   Problem: Coping: Goal: Psychosocial and spiritual needs will be supported Outcome: Progressing   Problem: Respiratory: Goal: Will maintain a patent airway Outcome: Progressing Goal: Complications related to the disease process, condition or treatment will be avoided or minimized Outcome: Progressing   

## 2019-07-17 NOTE — ED Notes (Signed)
[place] tech "Butch Penny", notified this RN that DR. Dhungel placed a order for the pt to have a complete abd [place] and that he needs to be NPO for 8 hours (per protocol) starting now until after his [place] is completed and resulted.

## 2019-07-17 NOTE — ED Notes (Signed)
Pt laying in bed comfortably. Respirations are normal and unlabored. No signs of acute distress.

## 2019-07-17 NOTE — ED Notes (Signed)
Received call from pt's sister Elma Limas, update given on pt condition.

## 2019-07-17 NOTE — H&P (Signed)
TRH H&P   Patient Demographics:    Tyeler Goedken, is a 68 y.o. male  MRN: [id number]   DOB - Aug 17, 1951  Admit Date - 07/17/2019  Outpatient Primary MD for the patient is [name], FNP   Patient coming from: [place] [facility]  No chief complaint on file.     HPI:    Jermar Colter  is a 68 y.o. male, past medical history of hypertension on HCTZ, possible alcohol abuse, patient was in good health until about 3 to 4 days ago when he started running low-grade fevers, mild diarrhea and a dry cough.  This morning when he was getting ready to go to to work he got lightheaded and fell.  He did not lose consciousness.  Presented to [facility] ER where he was diagnosed with COVID-19 pneumonia, sepsis, dehydration, potential and, AKI, elevated transaminases and he was admitted for further care.   Patient besides a dry cough and diarrhea denies any subjective complaints.  He denies any headache, no chest or abdominal pain, no dysuria, no focal weakness.  He had a negative head CT and a positive chest x-ray in the ER.    Review of systems:    A full 10 point Review of Systems was done, except as stated above, all other Review of Systems were negative.   With Past History of the following :    Past Medical History:  Diagnosis Date   Hypertension       No major surgical procedures in the past.    Social History:     Social History   Tobacco Use   Smoking status: Former Smoker   Smokeless tobacco: Never Used  Substance Use Topics   Alcohol use: Yes         Family History :   No family history of cirrhosis.   Home Medications:   Prior to Admission medications   Medication Sig Start Date End Date  Taking? Authorizing Provider  methylPREDNISolone sodium succinate (SOLU-MEDROL) 40 mg/mL injection Inject 1 mL (40 mg total) into the vein every 12 (twelve) hours. 07/17/19   Louellen Molder, MD     Allergies:     Allergies  Allergen Reactions   Penicillins Shortness Of Breath    Patient reports reaction occurred as a teenager and he experienced shortness of breath not requiring hospitalization.  Amlodipine Swelling   Lisinopril Swelling   Metoprolol Swelling   Tylenol [Acetaminophen]      Physical Exam:   Vitals  Blood pressure (!) 146/99, temperature 98.3 F (36.8 C), temperature source Oral, resp. rate (!) 35, SpO2 93 %.   1. General middle-aged African-American male lying in hospital bed in no discomfort,  2. Normal affect and insight, Not Suicidal or Homicidal, Awake Alert, Oriented X 3.  3. No F.N deficits, ALL C.Nerves Intact, Strength 5/5 all 4 extremities, Sensation intact all 4 extremities, Plantars down going.  4. Ears and Eyes appear Normal, Conjunctivae clear, PERRLA. Moist Oral Mucosa.  5. Supple Neck, No JVD, No cervical lymphadenopathy appriciated, No Carotid Bruits.  6. Symmetrical Chest wall movement, Good air movement bilaterally, CTAB.  7. RRR, No Gallops, Rubs or Murmurs, No Parasternal Heave.  8. Positive Bowel Sounds, Abdomen Soft, No tenderness, No organomegaly appriciated,No rebound -guarding or rigidity.  9.  No Cyanosis, Normal Skin Turgor, No Skin Rash or Bruise.  10. Good muscle tone,  joints appear normal , no effusions, Normal ROM.  11. No Palpable Lymph Nodes in Neck or Axillae      Data Review:    CBC Recent Labs  Lab 07/16/19 1349 07/16/19 2106  WBC 10.4 8.0  HGB 15.8 14.2  HCT 45.6 41.1  PLT 198 159  MCV 84.3 84.2  MCH 29.2 29.1  MCHC 34.6 34.5  RDW 13.4 13.4  LYMPHSABS 0.6*  --   MONOABS 0.4  --   EOSABS 0.0  --   BASOSABS 0.0  --     ------------------------------------------------------------------------------------------------------------------  Chemistries  Recent Labs  Lab 07/16/19 1349 07/16/19 2106 07/17/19 1201  NA 141  --  147*  K 2.9*  --  3.1*  CL 100  --  118*  CO2 23  --  16*  GLUCOSE 156*  --  112*  BUN 38*  --  27*  CREATININE 1.93* 1.50* 1.36*  CALCIUM 9.8  --  8.7*  AST 716*  --  540*  ALT 205*  --  171*  ALKPHOS 49  --  37*  BILITOT 1.4*  --  1.0   ------------------------------------------------------------------------------------------------------------------ estimated creatinine clearance is 55.5 mL/min (A) (by C-G formula based on SCr of 1.36 mg/dL (H)). ------------------------------------------------------------------------------------------------------------------ No results for input(s): TSH, T4TOTAL, T3FREE, THYROIDAB in the last 72 hours.  Invalid input(s): FREET3  Coagulation profile Recent Labs  Lab 07/16/19 1349 07/16/19 2106  INR 1.1 1.1   ------------------------------------------------------------------------------------------------------------------- No results for input(s): DDIMER in the last 72 hours. -------------------------------------------------------------------------------------------------------------------  Cardiac Enzymes No results for input(s): CKMB, TROPONINI, MYOGLOBIN in the last 168 hours.  Invalid input(s): CK ------------------------------------------------------------------------------------------------------------------ No results found for: BNP   ---------------------------------------------------------------------------------------------------------------  Urinalysis    Component Value Date/Time   COLORURINE YELLOW (A) 07/16/2019 1512   APPEARANCEUR CLEAR (A) 07/16/2019 1512   LABSPEC 1.011 07/16/2019 1512   PHURINE 6.0 07/16/2019 1512   GLUCOSEU NEGATIVE 07/16/2019 1512   HGBUR LARGE (A) 07/16/2019 1512   BILIRUBINUR NEGATIVE  07/16/2019 1512   KETONESUR NEGATIVE 07/16/2019 1512   PROTEINUR 100 (A) 07/16/2019 1512   NITRITE NEGATIVE 07/16/2019 1512   LEUKOCYTESUR NEGATIVE 07/16/2019 1512    ----------------------------------------------------------------------------------------------------------------   Imaging Results:    Ct Head Wo Contrast  Result Date: 07/16/2019 CLINICAL DATA:  Head injury after fall today. Uncertain loss of consciousness. EXAM: CT HEAD WITHOUT CONTRAST TECHNIQUE: Contiguous axial images were obtained from the base of the skull through the vertex without intravenous contrast. COMPARISON:  None.  FINDINGS: Brain: Mild chronic ischemic white matter disease is noted. No mass effect or midline shift is noted. Ventricular size is within normal limits. There is no evidence of mass lesion, hemorrhage or acute infarction. Vascular: No hyperdense vessel or unexpected calcification. Skull: Normal. Negative for fracture or focal lesion. Sinuses/Orbits: Bilateral maxillary and left frontal sinusitis is noted. Other: None. IMPRESSION: Mild chronic ischemic white matter disease. No acute intracranial abnormality seen. Bilateral maxillary and left frontal sinusitis. Electronically Signed   By: Lupita RaiderJames  Green Jr M.D.   On: 07/16/2019 14:13   [place] Abdomen Complete  Result Date: 07/17/2019 CLINICAL DATA:  Transaminitis. EXAM: ABDOMEN ULTRASOUND COMPLETE COMPARISON:  None. FINDINGS: Gallbladder: The gallbladder is partially contracted. No gallstones or wall thickening visualized. No sonographic Murphy sign noted by sonographer. Common bile duct: Diameter: 0.3 cm Liver: No focal lesion is identified. The liver is dense with increased echogenicity. Portal vein is patent on color Doppler imaging with normal direction of blood flow towards the liver. IVC: No abnormality visualized. Pancreas: Not visualized due to overlying bowel gas. Spleen: Size and appearance within normal limits. Right Kidney: Length: 10.3 cm. Echogenicity  within normal limits. No solid mass or hydronephrosis visualized. Simple 2.5 cm cyst noted. Left Kidney: Length: . 12.3 cm. Echogenicity within normal limits. No solid mass or hydronephrosis visualized. Two contiguous cysts versus a cyst with a single thin septation measuring 5.3 cm noted. Abdominal aorta: No aneurysm visualized. Other findings: None. IMPRESSION: No acute abnormality. Diffuse fatty infiltration of the liver. Electronically Signed   By: Drusilla Kannerhomas  Dalessio M.D.   On: 07/17/2019 11:48   Dg Chest Port 1 View  Result Date: 07/16/2019 CLINICAL DATA:  from home due to the family calling when they were unable to get the pt to the door in order to open it and let them in. EMS states that the fire department broke down the door and found the pt in the bathtub. Pt states that he was getting ready for work when he started feeling weak and fell in the tub and hit his head. Pt does not think he lost consciousness. Hx of hypertension. Former smoker EXAM: PORTABLE CHEST 1 VIEW COMPARISON:  None. FINDINGS: Cardiac silhouette normal in size.  No mediastinal or hilar masses. There is opacity in the right lobe, some milder opacity at the left lung base and minimal area of opacity in the left mid lung. This may all be due to scarring. Consider multifocal infection if there are consistent clinical findings. Remainder of the lungs is clear. No pleural effusion or pneumothorax. Skeletal structures are grossly intact. IMPRESSION: 1. Areas of lung opacity as detailed above may reflect chronic scarring. Consider multifocal pneumonia if there are consistent clinical findings. Otherwise, no evidence of acute cardiopulmonary disease. Electronically Signed   By: Amie Portlandavid  Ormond M.D.   On: 07/16/2019 13:59    My personal review of EKG: Rhythm sinus tachycardia at 131 bpm, nonspecific ST changes   Assessment & Plan:      1.  Severe sepsis causing acute hypoxic respiratory failure, dehydration, hypotension and syncope due  to COVID-19 pneumonitis.  Sepsis pathophysiology seems to have resolved, continue hydration, placed on IV steroids and remdesivir, currently mildly hypoxic requiring 2 L nasal cannula oxygen.  Tentatively has consented for convalescent plasma.  He does not want to take Actemra at any cost.  Will monitor him closely.  2.  Transaminitis - due to combination of COVID-19 infection, hypotension and alcohol use - asymptomatic, ultrasound stable, monitor trend, monitor  synthetic function.  Periodically check INR.  3.  Proteinuria and hematuria.  Likely due to acute stress and sepsis, repeat in 7 to 10 days outpatient setting.  Check CK tomorrow to rule out rhabdo.  4.  Alcohol use.  Counseled to quit.  Says he drinks 1 drink per day, will monitor on CIWA protocol.  No signs of DTs.  5.  Dehydration, hypotension, fall and AKI.  Improving with hydration.  PT OT eval and monitor.  Check CK in the morning.  6.  Hypokalemia.  Replaced will monitor.  7.  Hypertension.  Placed on hydralazine and monitor.    DVT Prophylaxis  Lovenox    AM Labs Ordered, also please review Full Orders  Family Communication: Admission, patients condition and plan of care including tests being ordered have been discussed with the patient   who indicate understanding and agree with the plan and Code Status.  Code Status Full  Likely DC to  TBD  Condition GUARDED    Consults called: None    Admission status: Inpt    Time spent in minutes : 35   Susa Raring M.D on 07/17/2019 at 5:52 PM  To page go to [email or web address] - password [facility]

## 2019-07-17 NOTE — ED Notes (Signed)
Called niece again, no answer. Pt states not to call any other family members at this time.

## 2019-07-18 LAB — COMPREHENSIVE METABOLIC PANEL
ALT: 157 U/L — ABNORMAL HIGH (ref 0–44)
AST: 425 U/L — ABNORMAL HIGH (ref 15–41)
Albumin: 3.2 g/dL — ABNORMAL LOW (ref 3.5–5.0)
Alkaline Phosphatase: 35 U/L — ABNORMAL LOW (ref 38–126)
Anion gap: 10 (ref 5–15)
BUN: 30 mg/dL — ABNORMAL HIGH (ref 8–23)
CO2: 20 mmol/L — ABNORMAL LOW (ref 22–32)
Calcium: 8.8 mg/dL — ABNORMAL LOW (ref 8.9–10.3)
Chloride: 117 mmol/L — ABNORMAL HIGH (ref 98–111)
Creatinine, Ser: 1.4 mg/dL — ABNORMAL HIGH (ref 0.61–1.24)
GFR calc Af Amer: 59 mL/min — ABNORMAL LOW (ref >60)
GFR calc non Af Amer: 51 mL/min — ABNORMAL LOW (ref >60)
Glucose, Bld: 240 mg/dL — ABNORMAL HIGH (ref 70–99)
Potassium: 4.3 mmol/L (ref 3.5–5.1)
Sodium: 147 mmol/L — ABNORMAL HIGH (ref 135–145)
Total Bilirubin: 1.2 mg/dL (ref 0.3–1.2)
Total Protein: 7.6 g/dL (ref 6.5–8.1)

## 2019-07-18 LAB — URINE CULTURE: Culture: 10000 — AB

## 2019-07-18 LAB — MAGNESIUM: Magnesium: 2.2 mg/dL (ref 1.7–2.4)

## 2019-07-18 LAB — GLUCOSE, CAPILLARY
Glucose-Capillary: 214 mg/dL — ABNORMAL HIGH (ref 70–99)
Glucose-Capillary: 293 mg/dL — ABNORMAL HIGH (ref 70–99)
Glucose-Capillary: 307 mg/dL — ABNORMAL HIGH (ref 70–99)

## 2019-07-18 LAB — CBC WITH DIFFERENTIAL/PLATELET
Abs Immature Granulocytes: 0.01 10*3/uL (ref 0.00–0.07)
Basophils Absolute: 0 10*3/uL (ref 0.0–0.1)
Basophils Relative: 0 %
Eosinophils Absolute: 0 10*3/uL (ref 0.0–0.5)
Eosinophils Relative: 0 %
HCT: 46.3 % (ref 39.0–52.0)
Hemoglobin: 15 g/dL (ref 13.0–17.0)
Immature Granulocytes: 0 %
Lymphocytes Relative: 32 %
Lymphs Abs: 1.1 10*3/uL (ref 0.7–4.0)
MCH: 28.9 pg (ref 26.0–34.0)
MCHC: 32.4 g/dL (ref 30.0–36.0)
MCV: 89.2 fL (ref 80.0–100.0)
Monocytes Absolute: 0.2 10*3/uL (ref 0.1–1.0)
Monocytes Relative: 5 %
Neutro Abs: 2.1 10*3/uL (ref 1.7–7.7)
Neutrophils Relative %: 63 %
Platelets: 171 10*3/uL (ref 150–400)
RBC: 5.19 MIL/uL (ref 4.22–5.81)
RDW: 13.8 % (ref 11.5–15.5)
WBC: 3.4 10*3/uL — ABNORMAL LOW (ref 4.0–10.5)
nRBC: 0 % (ref 0.0–0.2)

## 2019-07-18 LAB — C-REACTIVE PROTEIN: CRP: 17.2 mg/dL — ABNORMAL HIGH (ref <1.0)

## 2019-07-18 LAB — PROTIME-INR
INR: 1.1 (ref 0.8–1.2)
Prothrombin Time: 14.3 seconds (ref 11.4–15.2)

## 2019-07-18 LAB — CK: Total CK: 1375 U/L — ABNORMAL HIGH (ref 49–397)

## 2019-07-18 LAB — BRAIN NATRIURETIC PEPTIDE: B Natriuretic Peptide: 59 pg/mL (ref 0.0–100.0)

## 2019-07-18 LAB — D-DIMER, QUANTITATIVE: D-Dimer, Quant: 6.09 ug/mL-FEU — ABNORMAL HIGH (ref 0.00–0.50)

## 2019-07-18 MED ORDER — INSULIN ASPART 100 UNIT/ML ~~LOC~~ SOLN
0.0000 [IU] | Freq: Three times a day (TID) | SUBCUTANEOUS | Status: DC
  Administered 2019-07-18: 13:00:00 5 [IU] via SUBCUTANEOUS
  Administered 2019-07-18: 17:00:00 7 [IU] via SUBCUTANEOUS
  Administered 2019-07-18: 3 [IU] via SUBCUTANEOUS
  Administered 2019-07-19 (×2): 2 [IU] via SUBCUTANEOUS
  Administered 2019-07-20: 17:00:00 3 [IU] via SUBCUTANEOUS
  Administered 2019-07-20 – 2019-07-21 (×3): 1 [IU] via SUBCUTANEOUS
  Administered 2019-07-21 – 2019-07-22 (×2): 3 [IU] via SUBCUTANEOUS
  Administered 2019-07-22: 13:00:00 2 [IU] via SUBCUTANEOUS
  Administered 2019-07-23: 13:00:00 1 [IU] via SUBCUTANEOUS

## 2019-07-18 MED ORDER — INSULIN ASPART 100 UNIT/ML ~~LOC~~ SOLN
0.0000 [IU] | Freq: Every day | SUBCUTANEOUS | Status: DC
  Administered 2019-07-18: 3 [IU] via SUBCUTANEOUS
  Administered 2019-07-19 – 2019-07-20 (×2): 2 [IU] via SUBCUTANEOUS

## 2019-07-18 MED ORDER — DEXTROSE 5 % IV SOLN
INTRAVENOUS | Status: AC
  Administered 2019-07-18 (×2): via INTRAVENOUS

## 2019-07-18 MED ORDER — METHYLPREDNISOLONE SODIUM SUCC 40 MG IJ SOLR
40.0000 mg | Freq: Every day | INTRAMUSCULAR | Status: DC
  Administered 2019-07-19 – 2019-07-21 (×3): 40 mg via INTRAVENOUS
  Filled 2019-07-18 (×3): qty 1

## 2019-07-18 MED ORDER — INSULIN GLARGINE 100 UNIT/ML ~~LOC~~ SOLN
8.0000 [IU] | Freq: Once | SUBCUTANEOUS | Status: AC
  Administered 2019-07-18: 8 [IU] via SUBCUTANEOUS
  Filled 2019-07-18: qty 0.08

## 2019-07-18 MED ORDER — CLONIDINE HCL 0.1 MG PO TABS
0.1000 mg | ORAL_TABLET | Freq: Three times a day (TID) | ORAL | Status: DC
  Administered 2019-07-18 – 2019-07-19 (×4): 0.1 mg via ORAL
  Filled 2019-07-18 (×4): qty 1

## 2019-07-18 MED ORDER — VITAMIN B-1 100 MG PO TABS
100.0000 mg | ORAL_TABLET | Freq: Every day | ORAL | Status: DC
  Administered 2019-07-18 – 2019-07-23 (×6): 100 mg via ORAL
  Filled 2019-07-18 (×6): qty 1

## 2019-07-18 NOTE — Progress Notes (Signed)
Patient states that he is very capable of living alone and caring for himself. Patient is alert and oriented and independent at this time. Patient states he has family all around him at home (living all around him per patient.) Will share concerns with MSW/CM.

## 2019-07-18 NOTE — Progress Notes (Signed)
MD page r/t Bp @ 0520am

## 2019-07-18 NOTE — Progress Notes (Signed)
PROGRESS NOTE                                                                                                                                                                                                             Patient Demographics:    Lawrence Barnett, is a 68 y.o. male, DOB - 1950-12-31, PTC:[id number]  Outpatient Primary MD for the patient is [name], FNP    LOS - 1  Admit date - 07/17/2019    CC - Weakness     Brief Narrative -  Lawrence Barnett  is a 68 y.o. male, past medical history of hypertension on HCTZ, possible alcohol abuse, patient was in good health until about 3 to 4 days ago when he started running low-grade fevers, mild diarrhea and a dry cough.  This morning when he was getting ready to go to to work he got lightheaded and fell.  He did not lose consciousness.  Presented to [facility] ER where he was diagnosed with COVID-19 pneumonia, sepsis, dehydration, potential and, AKI, elevated transaminases and he was admitted for further care.    Subjective:    Lawrence Barnett today has, No headache, No chest pain, No abdominal pain - No Nausea, No new weakness tingling or numbness, no Cough - SOB.  Mild diarrhea.   Assessment  & Plan :     1.  Severe sepsis causing acute hypoxic respiratory failure, dehydration, hypotension and syncope due to COVID-19 pneumonitis.  Sepsis pathophysiology seems to have resolved, continue hydration, placed on IV steroids and remdesivir, he has improved and now down to room air at times, asymptomatic, diarrhea improving continue to monitor.  If needed he has consented for convalescent plasma but refuses to use Actemra.  SpO2: 93 % O2 Flow Rate (L/min): 2 L/min   COVID-19 Labs  Recent Labs    07/16/19 1458 07/17/19 1813 07/18/19 0125  DDIMER  --   --  6.09*  CRP 21.0* 19.5* 17.2*    Lab Results  Component Value Date   SARSCOV2NAA POSITIVE (A) 07/16/2019      Hepatic Function Latest Ref Rng & Units 07/18/2019 07/17/2019 07/16/2019  Total Protein 6.5 - 8.1 g/dL 7.6 7.9 9.0(H)  Albumin 3.5 - 5.0 g/dL 3.2(L) 3.4(L) 4.2  AST 15 - 41 U/L 425(H) 540(H) 716(H)  ALT 0 - 44 U/L 157(H) 171(H)  205(H)  Alk Phosphatase 38 - 126 U/L 35(L) 37(L) 49  Total Bilirubin 0.3 - 1.2 mg/dL 1.2 1.0 1.4(H)    Lab Results  Component Value Date   INR 1.1 07/18/2019   INR 1.1 07/16/2019   INR 1.1 07/16/2019       Component Value Date/Time   BNP 59.0 07/18/2019 0125    2.  Transaminitis - due to combination of COVID-19 infection, hypotension and alcohol use - asymptomatic, monitor trend, monitor synthetic function.  Periodically check INR.  Right upper quadrant ultrasound does show fatty liver for which she will follow with his PCP and quit alcohol use.  Stable synthetic function of liver INR is stable.  3.  Proteinuria and hematuria.  Likely due to acute stress and sepsis, repeat in 7 to 10 days outpatient setting.  CK levels do suggest mild rhabdomyolysis for which we will continue IV fluids for hydration.  4.  Alcohol use.  Counseled to quit.  Says he drinks 1 drink per day, will monitor on CIWA protocol.  No signs of DTs.  5.  Dehydration, hypotension, fall and AKI.  Improving with hydration.  PT OT eval and monitor.  Check CK in the morning.  6.  Hypokalemia.  Replaced and stable.  7.  Hypertension.  Strong combination of hydralazine and Catapres will monitor.   8.  Fatty liver.  Follow with PCP and quit alcohol.    Condition - Fair  Family Communication  :  None  Code Status :  Full  Diet :   Diet Order            Diet Heart Room service appropriate? Yes; Fluid consistency: Thin  Diet effective now               Disposition Plan  :  Home   Consults  :  None  Procedures  :   CT Head - non acute  RUQ [place] - fatty Liver.  PUD Prophylaxis :    DVT Prophylaxis  :  Lovenox    Lab Results  Component Value Date   PLT 171  07/18/2019    Inpatient Medications  Scheduled Meds:  cloNIDine  0.1 mg Oral TID   enoxaparin (LOVENOX) injection  40 mg Subcutaneous Z30Q   folic acid  1 mg Oral Daily   hydrALAZINE  50 mg Oral Q8H   insulin aspart  0-5 Units Subcutaneous QHS   insulin aspart  0-9 Units Subcutaneous TID WC   LORazepam  0-4 mg Intravenous Q6H   Followed by   Derrill Memo ON 07/19/2019] LORazepam  0-4 mg Intravenous Q12H   [START ON 07/19/2019] methylPREDNISolone (SOLU-MEDROL) injection  40 mg Intravenous Daily   multivitamin with minerals  1 tablet Oral Daily   sodium chloride flush  3 mL Intravenous Q12H   thiamine  100 mg Oral Daily   Continuous Infusions:  dextrose 75 mL/hr at 07/18/19 6578   remdesivir 100 mg in NS 250 mL     PRN Meds:.acetaminophen, albuterol, diphenhydrAMINE, loperamide, LORazepam **OR** LORazepam, [DISCONTINUED] ondansetron **OR** ondansetron (ZOFRAN) IV  Antibiotics  :    Anti-infectives (From admission, onward)   Start     Dose/Rate Route Frequency Ordered Stop   07/18/19 1000  remdesivir 100 mg in sodium chloride 0.9 % 250 mL IVPB     100 mg 500 mL/hr over 30 Minutes Intravenous Every 24 hours 07/17/19 1754 07/22/19 0959       Time Spent in minutes  [place]  M.D on 07/18/2019 at 10:27 AM  To page go to [email or web address] - password Fernando [email or web address]  [facility] -  Office  [phone]     See all Orders from today for further details    Objective:   Vitals:   07/17/19 2008 07/18/19 0513 07/18/19 0520 07/18/19 0743  BP: (!) 150/109 (!) 149/109 (!) 149/109 (!) 134/97  Pulse: 95 86  88  Resp: (!) 28 (!) 27  (!) 23  Temp:  97.7 F (36.5 C)  97.7 F (36.5 C)  TempSrc:  Oral  Oral  SpO2: 94% 96%  93%  Weight:      Height:        Wt Readings from Last 3 Encounters:  07/17/19 85.5 kg  07/16/19 79.4 kg  03/09/18 86.2 kg     Intake/Output Summary (Last 24 hours) at 07/18/2019 1027 Last data filed at 07/18/2019 0615 Gross per 24 hour    Intake 1321.17 ml  Output 1400 ml  Net -78.83 ml     Physical Exam  Awake Alert, Oriented X 3, No new F.N deficits, Normal affect [place].AT,PERRAL Supple Neck,No JVD, No cervical lymphadenopathy appriciated.  Symmetrical Chest wall movement, Good air movement bilaterally, CTAB RRR,No Gallops,Rubs or new Murmurs, No Parasternal Heave +ve B.Sounds, Abd Soft, No tenderness, No organomegaly appriciated, No rebound - guarding or rigidity. No Cyanosis, Clubbing or edema, No new Rash or bruise     Data Review:    CBC Recent Labs  Lab 07/16/19 1349 07/16/19 2106 07/17/19 1813 07/18/19 0125  WBC 10.4 8.0 4.4 3.4*  HGB 15.8 14.2 15.3 15.0  HCT 45.6 41.1 46.4 46.3  PLT 198 159 180 171  MCV 84.3 84.2 89.1 89.2  MCH 29.2 29.1 29.4 28.9  MCHC 34.6 34.5 33.0 32.4  RDW 13.4 13.4 13.9 13.8  LYMPHSABS 0.6*  --   --  1.1  MONOABS 0.4  --   --  0.2  EOSABS 0.0  --   --  0.0  BASOSABS 0.0  --   --  0.0    Chemistries  Recent Labs  Lab 07/16/19 1349 07/16/19 2106 07/17/19 1201 07/18/19 0125  NA 141  --  147* 147*  K 2.9*  --  3.1* 4.3  CL 100  --  118* 117*  CO2 23  --  16* 20*  GLUCOSE 156*  --  112* 240*  BUN 38*  --  27* 30*  CREATININE 1.93* 1.50* 1.36* 1.40*  CALCIUM 9.8  --  8.7* 8.8*  MG  --   --   --  2.2  AST 716*  --  540* 425*  ALT 205*  --  171* 157*  ALKPHOS 49  --  37* 35*  BILITOT 1.4*  --  1.0 1.2   ------------------------------------------------------------------------------------------------------------------ No results for input(s): CHOL, HDL, LDLCALC, TRIG, CHOLHDL, LDLDIRECT in the last 72 hours.  No results found for: HGBA1C ------------------------------------------------------------------------------------------------------------------ No results for input(s): TSH, T4TOTAL, T3FREE, THYROIDAB in the last 72 hours.  Invalid input(s): FREET3  Cardiac Enzymes No results for input(s): CKMB, TROPONINI, MYOGLOBIN in the last 168 hours.  Invalid  input(s): CK ------------------------------------------------------------------------------------------------------------------    Component Value Date/Time   BNP 59.0 07/18/2019 0125    Micro Results Recent Results (from the past 240 hour(s))  Culture, blood (Routine x 2)     Status: None (Preliminary result)   Collection Time: 07/16/19  1:50 PM   Specimen: BLOOD  Result Value Ref Range Status   Specimen Description BLOOD RIGHT ANTECUBITAL  Final  Special Requests   Final    BOTTLES DRAWN AEROBIC AND ANAEROBIC Blood Culture adequate volume   Culture   Final    NO GROWTH 2 DAYS Performed at [facility], [address] [place], [place] [address]    Report Status PENDING  Incomplete  Culture, blood (Routine x 2)     Status: None (Preliminary result)   Collection Time: 07/16/19  2:58 PM   Specimen: BLOOD  Result Value Ref Range Status   Specimen Description BLOOD BLOOD RIGHT HAND  Final   Special Requests   Final    BOTTLES DRAWN AEROBIC AND ANAEROBIC Blood Culture results may not be optimal due to an inadequate volume of blood received in culture bottles   Culture   Final    NO GROWTH 2 DAYS Performed at [facility], [address], [place], [place] [address]    Report Status PENDING  Incomplete  SARS CORONAVIRUS 2 (TAT 6-24 HRS) Nasopharyngeal Nasopharyngeal Swab     Status: Abnormal   Collection Time: 07/16/19  3:12 PM   Specimen: Nasopharyngeal Swab  Result Value Ref Range Status   SARS Coronavirus 2 POSITIVE (A) NEGATIVE Final    Comment: RESULT CALLED TO, READ BACK BY AND VERIFIED WITH: A.MCCULLEY EN @ 3143 07/17/2019 BY SANDY (NOTE) SARS-CoV-2 target nucleic acids are DETECTED. The SARS-CoV-2 RNA is generally detectable in upper and lower respiratory specimens during the acute phase of infection. Positive results are indicative of active infection with SARS-CoV-2. Clinical  correlation with patient history and other diagnostic information  is necessary to determine patient infection status. Positive results do  not rule out bacterial infection or co-infection with other viruses. The expected result is Negative. Fact Sheet for Patients: SugarRoll.be Fact Sheet for Healthcare Providers: https://www.woods-mathews.com/ This test is not yet approved or cleared by the [place] FDA and  has been authorized for detection and/or diagnosis of SARS-CoV-2 by FDA under an Emergency Use Authorization (EUA). This EUA will remain  in effect (meaning this test can be used)  for the duration of the COVID-19 declaration under Section 564(b)(1) of the Act, 21 U.S.C. section 360bbb-3(b)(1), unless the authorization is terminated or revoked sooner. Performed at [facility], [place] [address], [place], [place] [address]   Urine culture     Status: None (Preliminary result)   Collection Time: 07/16/19  3:12 PM   Specimen: Urine, Clean Catch  Result Value Ref Range Status   Specimen Description   Final    URINE, CLEAN CATCH Performed at [facility], [address] [place], [place] [address]    Special Requests   Final    NONE Performed at [facility], [address], [place], [place] [address]    Culture   Final    CULTURE REINCUBATED FOR BETTER GROWTH Performed at [facility], [place] [address], [place],  [address]    Report Status PENDING  Incomplete  MRSA PCR Screening     Status: None   Collection Time: 07/17/19  1:30 PM   Specimen: Nasal Mucosa; Nasopharyngeal  Result Value Ref Range Status   MRSA by PCR NEGATIVE NEGATIVE Final    Comment:        The GeneXpert MRSA Assay (FDA approved for NASAL specimens only), is one component of a comprehensive MRSA colonization surveillance program. It is not intended to diagnose MRSA infection nor to guide or monitor treatment for MRSA infections. Performed at [facility], [address] [place], [place] [address]     Radiology Reports Ct Head Wo Contrast  Result Date: 07/16/2019 CLINICAL DATA:  Head injury after fall today. Uncertain loss of consciousness. EXAM: CT HEAD WITHOUT CONTRAST TECHNIQUE: Contiguous axial images were obtained from the base of the skull through the vertex without intravenous contrast. COMPARISON:  None. FINDINGS: Brain: Mild chronic ischemic white matter disease is noted. No mass effect or midline shift is noted. Ventricular size is within normal limits. There is no evidence of mass lesion, hemorrhage or acute infarction. Vascular: No hyperdense vessel or unexpected calcification. Skull: Normal. Negative for fracture or focal lesion. Sinuses/Orbits: Bilateral maxillary and left frontal sinusitis is noted. Other: None. IMPRESSION: Mild chronic ischemic white matter disease. No acute intracranial abnormality seen. Bilateral maxillary and left frontal sinusitis. Electronically Signed   By: Marijo Conception M.D.   On: 07/16/2019 14:13   US Abdomen Complete  Result Date: 07/17/2019 CLINICAL DATA:  Transaminitis. EXAM: ABDOMEN ULTRASOUND COMPLETE COMPARISON:  None. FINDINGS: Gallbladder: The gallbladder is partially contracted. No gallstones or wall thickening visualized. No sonographic Murphy sign noted by sonographer. Common bile duct: Diameter: 0.3 cm Liver: No focal lesion is identified. The liver is dense with increased echogenicity. Portal vein is patent on color Doppler imaging with normal direction of blood flow towards the liver. IVC: No abnormality visualized. Pancreas: Not visualized due to overlying bowel gas. Spleen: Size and appearance within normal limits. Right Kidney: Length: 10.3 cm. Echogenicity within normal limits. No solid mass or hydronephrosis visualized. Simple 2.5 cm cyst noted. Left Kidney: Length: . 12.3 cm. Echogenicity within normal limits. No solid mass or hydronephrosis visualized. Two contiguous cysts versus a cyst with a single thin  septation measuring 5.3 cm noted. Abdominal aorta: No aneurysm visualized. Other findings: None. IMPRESSION: No acute abnormality. Diffuse fatty infiltration of the liver. Electronically Signed   By: Inge Rise M.D.   On: 07/17/2019 11:48   Dg Chest Port 1 View  Result Date: 07/16/2019 CLINICAL DATA:  from home due to the family calling when they were unable to get the pt to the door in order to open it and let them in. EMS states that the fire department broke down the door and found the pt in the bathtub. Pt states that he was getting ready for work when he started feeling weak and fell in the tub and hit his head. Pt does not think he lost consciousness. Hx of hypertension. Former smoker EXAM: PORTABLE CHEST 1 VIEW COMPARISON:  None. FINDINGS: Cardiac silhouette normal in size.  No mediastinal or hilar masses. There is opacity in the right lobe, some milder opacity at the left lung base and minimal area of opacity in the left mid lung. This may all be due to scarring. Consider multifocal infection if there are consistent clinical findings. Remainder of the lungs is clear. No pleural effusion or pneumothorax. Skeletal structures are grossly intact. IMPRESSION: 1. Areas of lung opacity as detailed above may reflect chronic scarring. Consider multifocal pneumonia if there are consistent clinical findings. Otherwise, no evidence of acute cardiopulmonary disease. Electronically Signed   By: Lajean Manes M.D.   On: 07/16/2019 13:59

## 2019-07-18 NOTE — Progress Notes (Signed)
Pt's sister, Tilak Oakley, calling the department this evening to express her concerns of the being discharged home alone. It is noted that pt has already had OT and PT eval and so will inform the primary nurse to have dayshift staff follow up as needed. Sister's number is [phone].

## 2019-07-18 NOTE — Progress Notes (Signed)
Occupational Therapy Evaluation Patient Details Name: Lawrence Barnett MRN: [id number] DOB: 07-Mar-1951 Today's Date: 07/18/2019    History of Present Illness 68 y.o. male, past medical history of hypertension on HCTZ, possible alcohol abuse, patient was in good health until about 3 to 4 days ago when he started running low-grade fevers, mild diarrhea and a dry cough. When he was getting ready to go to to work he got lightheaded and fell.  He did not lose consciousness.  Presented to [facility] ER where he was diagnosed with COVID-19 pneumonia, sepsis, dehydration, potential and, AKI, elevated transaminases and he was admitted for further care.    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 Pt ambulated to bathroom on RA with desat to mid 80s with 2/4 DOE. Quick rebound to 90s. Began educaiton on HEP with theraband. Pt completed flutter valve and incentive spirometer (ableto pull 1021ml) x 10 each. Educated on Pursed lip breathing exercises and handout issued. Do not anticipate OT needs after DC. Pt will need 3in1 to use oas shower chair after DC. Will follow acutely.     Follow Up Recommendations  No OT follow up;Supervision - Intermittent    Equipment Recommendations  3 in 1 bedside commode    Recommendations for Other Services PT consult     Precautions / Restrictions Precautions Precautions: Fall      Mobility Bed Mobility Overal bed mobility: Modified Independent                Transfers Overall transfer level: Needs assistance   Transfers: Sit to/from Stand Sit to Stand: Supervision              Balance Overall balance assessment: Mild deficits observed, not formally tested                                         ADL either performed or assessed with clinical judgement   ADL Overall ADL's : Needs assistance/impaired     Grooming: Set  up;Standing;Supervision/safety   Upper Body Bathing: Set up;Sitting   Lower Body Bathing: Supervison/ safety;Sit to/from stand   Upper Body Dressing : Set up;Sitting   Lower Body Dressing: Supervision/safety;Sit to/from stand   Toilet Transfer: Supervision/safety;RW;Ambulation   Toileting- Water quality scientist and Hygiene: Modified independent       Functional mobility during ADLs: Supervision/safety;Rolling walker General ADL Comments: initially dizzy with sitting EOB; RW used for sfaety as pt felt "unsteady"     Vision Baseline Vision/History: Wears glasses Wears Glasses: At all times Additional Comments: walks slowly because he does not have his glasses     Perception     Praxis      Pertinent Vitals/Pain Pain Assessment: No/denies pain     Hand Dominance Right   Extremity/Trunk Assessment Upper Extremity Assessment Upper Extremity Assessment: Generalized weakness   Lower Extremity Assessment Lower Extremity Assessment: Defer to PT evaluation   Cervical / Trunk Assessment Cervical / Trunk Assessment: Normal   Communication Communication Communication: No difficulties   Cognition Arousal/Alertness: Awake/alert Behavior During Therapy: WFL for tasks assessed/performed Overall Cognitive Status: Within Functional Limits for tasks assessed                                     General  Comments  Likes classical music; nephew that he is close with was recetnly diagnosed with cancer    Exercises Exercises: General Upper Extremity;Other exercises General Exercises - Upper Extremity Shoulder Flexion: Strengthening;Both;10 reps;Seated;Theraband Theraband Level (Shoulder Flexion): Level 2 (Red) Shoulder ABduction: Strengthening;Both;10 reps;Seated;Theraband Theraband Level (Shoulder Abduction): Level 2 (Red) Other Exercises Other Exercises: pursed lip breathign x 10 Other Exercises: flutter valve x 10 Other Exercises: incentive spirometer x 10 -  ablet o pull 1000 ml   Shoulder Instructions      Home Living Family/patient expects to be discharged to:: Private residence Living Arrangements: Alone Available Help at Discharge: Family;Available PRN/intermittently Type of Home: House Home Access: Stairs to enter [employer] of Steps: 2 Entrance Stairs-Rails: None Home Layout: One level     Bathroom Shower/Tub: Chief Strategy Officer: Standard Bathroom Accessibility: Yes How Accessible: Accessible via walker Home Equipment: None          Prior Functioning/Environment Level of Independence: Independent        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        OT Problem List: Decreased strength;Decreased activity tolerance;Decreased safety awareness;Decreased knowledge of use of DME or AE;Cardiopulmonary status limiting activity      OT Treatment/Interventions: Self-care/ADL training;Therapeutic exercise;Neuromuscular education;Energy conservation;DME and/or AE instruction;Therapeutic activities;Patient/family education;Balance training    OT Goals(Current goals can be found in the care plan section) Acute Rehab OT Goals Patient Stated Goal: to get better and go home OT Goal Formulation: With patient Time For Goal Achievement: 08/01/19 Potential to Achieve Goals: Good  OT Frequency: Min 3X/week   Barriers to D/C:            Co-evaluation              AM-PAC OT "6 Clicks" Daily Activity     Outcome Measure Help from another person eating meals?: None Help from another person taking care of personal grooming?: A Little Help from another person toileting, which includes using toliet, bedpan, or urinal?: A Little Help from another person bathing (including washing, rinsing, drying)?: A Little Help from another person to put on and taking off regular upper body clothing?: A Little Help from another person to put on and taking off regular lower body clothing?: A Little 6 Click Score: 19    End of Session Equipment Utilized During Treatment: Rolling walker Nurse Communication: Mobility status  Activity Tolerance: Patient tolerated treatment well Patient left: in chair;with call bell/phone within reach  OT Visit Diagnosis: Unsteadiness on feet (R26.81);Muscle weakness (generalized) (M62.81)                Time: 6834-1962 OT Time Calculation (min): 50 min Charges:  OT General Charges $OT Visit: 1 Visit OT Evaluation $OT Eval Moderate Complexity: 1 Mod OT Treatments $Self Care/Home Management : 8-22 mins $Therapeutic Exercise: 8-22 mins  Luisa Dago, OT/L   Acute OT Clinical Specialist Acute Rehabilitation Services Pager [phone] Office [phone]   [facility] 07/18/2019, 11:26 AM

## 2019-07-18 NOTE — Progress Notes (Signed)
Administered hydralazine 2200 dosage @ 2008, r/t bp. Pt noted BP normally is high

## 2019-07-18 NOTE — Evaluation (Signed)
Physical Therapy Evaluation Patient Details Name: Lawrence Barnett MRN: [id number] DOB: [birth year] Today's Date: 07/18/2019   History of Present Illness  68 y.o. male, past medical history of hypertension on HCTZ, possible alcohol abuse, patient was in good health until about 3 to 4 days ago when he started running low-grade fevers, mild diarrhea and a dry cough. When he was getting ready to go to to work he got lightheaded and fell.  He did not lose consciousness.  Presented to [facility] ER where he was diagnosed with COVID-19 pneumonia, sepsis, dehydration, potential and, AKI, elevated transaminases and he was admitted for further care.   Clinical Impression   Pt admitted with above diagnosis. 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 Pt currently with functional limitations due to the deficits listed below (see PT Problem List).  initially pt was on room air states has just gone to rest room and returned, sats in 90s sitting in recliner listening to classical music. Initiated assessment, when pt stood up to ambulate, in room, noted sats dropped to 70s. Pt was able to sit down and complete pursed lip breathing, returned pt to supplemental 02 at 3L/min and was ble to regain sats to 90s. Also used flutter valve to assist with this. For ambulation in hall pt requested use of RW, and he did well with ambulating up to approx 110ft with min guard assist. Noted again that monitor was reading sats in 70s and 80s with ambulation, but pt denied distress and none was noted on face. Pt will benefit from skilled PT to increase his overall strength, balance and coordiantion, activity tolerance,  independence and safety with mobility to allow discharge to the venue listed below.       Follow Up Recommendations Home health PT    Equipment Recommendations  Rolling walker with 5" wheels    Recommendations for Other Services       Precautions  / Restrictions Precautions Precautions: Fall Restrictions Weight Bearing Restrictions: No      Mobility  Bed Mobility Overal bed mobility: Modified Independent                Transfers Overall transfer level: Needs assistance Equipment used: Rolling walker (2 wheeled) Transfers: Sit to/from Stand Sit to Stand: Supervision            Ambulation/Gait Ambulation/Gait assistance: Min guard Gait Distance (Feet): 180 Feet Assistive device: Rolling walker (2 wheeled) Gait Pattern/deviations: Step-through pattern;Wide base of support     General Gait Details: initiated gait in room on room air and noted desat to 70s, replaced 02 on at 3L/min and sats increased, ambulated in hall with RW and min guard assist, 02 sats again dropped into 70s, no visiable changes noted in pt and pt denies any distress.  Stairs            Wheelchair Mobility    Modified Rankin (Stroke Patients Only)       Balance Overall balance assessment: Needs assistance(reports 1 fall on day of admission) Sitting-balance support: Feet unsupported Sitting balance-Leahy Scale: Good   Postural control: Other (comment)(none noted) Standing balance support: During functional activity;Bilateral upper extremity supported Standing balance-Leahy Scale: Fair                               Pertinent Vitals/Pain Pain Assessment: No/denies pain    Home Living Family/patient expects to be discharged  to:: Private residence Living Arrangements: Alone Available Help at Discharge: Family;Available PRN/intermittently Type of Home: House Home Access: Stairs to enter Entrance Stairs-Rails: None Entrance Stairs-Number of Steps: 2 Home Layout: One level Home Equipment: None      Prior Function Level of Independence: Independent         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     Hand Dominance   Dominant Hand: Right    Extremity/Trunk Assessment   Upper Extremity  Assessment Upper Extremity Assessment: Defer to OT evaluation    Lower Extremity Assessment Lower Extremity Assessment: Generalized weakness    Cervical / Trunk Assessment Cervical / Trunk Assessment: Normal  Communication   Communication: No difficulties  Cognition Arousal/Alertness: Awake/alert Behavior During Therapy: WFL for tasks assessed/performed Overall Cognitive Status: Within Functional Limits for tasks assessed                                        General Comments      Exercises Other Exercises Other Exercises: flutter valve to increase 02 saturation   Assessment/Plan    PT Assessment Patient needs continued PT services  PT Problem List Decreased strength;Decreased activity tolerance;Decreased balance;Decreased mobility;Decreased coordination;Decreased safety awareness       PT Treatment Interventions Gait training;Functional mobility training;Stair training;Therapeutic activities;Therapeutic exercise;Balance training;Neuromuscular re-education;Patient/family education    PT Goals (Current goals can be found in the Care Plan section)  Acute Rehab PT Goals Patient Stated Goal: to get better and go home PT Goal Formulation: With patient Time For Goal Achievement: 08/01/19 Potential to Achieve Goals: Good    Frequency Min 3X/week   Barriers to discharge Other (comment) lives alone    Co-evaluation               AM-PAC PT "6 Clicks" Mobility  Outcome Measure Help needed turning from your back to your side while in a flat bed without using bedrails?: None Help needed moving from lying on your back to sitting on the side of a flat bed without using bedrails?: None Help needed moving to and from a bed to a chair (including a wheelchair)?: A Little Help needed standing up from a chair using your arms (e.g., wheelchair or bedside chair)?: A Little Help needed to walk in hospital room?: A Little Help needed climbing 3-5 steps with a  railing? : A Lot 6 Click Score: 19    End of Session Equipment Utilized During Treatment: Oxygen Activity Tolerance: Treatment limited secondary to medical complications (Comment) Patient left: in chair;with call bell/phone within reach Nurse Communication: Mobility status PT Visit Diagnosis: Other abnormalities of gait and mobility (R26.89);Muscle weakness (generalized) (M62.81)    Time: 6599-3570 PT Time Calculation (min) (ACUTE ONLY): 47 min   Charges:   PT Evaluation $PT Eval Moderate Complexity: 1 Mod PT Treatments $Gait Training: 8-22 mins $Therapeutic Activity: 8-22 mins        Drema Pry, PT   Freddi Starr 07/18/2019, 3:56 PM

## 2019-07-18 NOTE — Progress Notes (Signed)
Family Update:  Spoke with patient's sister. She voiced concerns that the patient seems "different", however she unable to explain what seems different about the patient. Upon assessment patient is alert and oriented. She is very concerned about who will take care of him when he returns home. 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 It was explained that physical therapy and occupational therapy will assess the patient for any change from baseline and his ability to care for himself safely prior to discarge. Family member admits that the patient would refuse rehab and home health even if it was recommended by PT/OT. Educated patient's sister on a patient's right to refuse, and she verbalized understanding.

## 2019-07-19 LAB — CK: Total CK: 11367 U/L — ABNORMAL HIGH (ref 49–397)

## 2019-07-19 LAB — MAGNESIUM: Magnesium: 2.1 mg/dL (ref 1.7–2.4)

## 2019-07-19 LAB — CBC WITH DIFFERENTIAL/PLATELET
Abs Immature Granulocytes: 0.12 10*3/uL — ABNORMAL HIGH (ref 0.00–0.07)
Basophils Absolute: 0 10*3/uL (ref 0.0–0.1)
Basophils Relative: 0 %
Eosinophils Absolute: 0 10*3/uL (ref 0.0–0.5)
Eosinophils Relative: 0 %
HCT: 45.5 % (ref 39.0–52.0)
Hemoglobin: 14.6 g/dL (ref 13.0–17.0)
Immature Granulocytes: 2 %
Lymphocytes Relative: 19 %
Lymphs Abs: 1.3 10*3/uL (ref 0.7–4.0)
MCH: 28.9 pg (ref 26.0–34.0)
MCHC: 32.1 g/dL (ref 30.0–36.0)
MCV: 90.1 fL (ref 80.0–100.0)
Monocytes Absolute: 0.6 10*3/uL (ref 0.1–1.0)
Monocytes Relative: 8 %
Neutro Abs: 4.6 10*3/uL (ref 1.7–7.7)
Neutrophils Relative %: 71 %
Platelets: 193 10*3/uL (ref 150–400)
RBC: 5.05 MIL/uL (ref 4.22–5.81)
RDW: 14 % (ref 11.5–15.5)
WBC: 6.6 10*3/uL (ref 4.0–10.5)
nRBC: 0.3 % — ABNORMAL HIGH (ref 0.0–0.2)

## 2019-07-19 LAB — COMPREHENSIVE METABOLIC PANEL
ALT: 141 U/L — ABNORMAL HIGH (ref 0–44)
AST: 256 U/L — ABNORMAL HIGH (ref 15–41)
Albumin: 3 g/dL — ABNORMAL LOW (ref 3.5–5.0)
Alkaline Phosphatase: 39 U/L (ref 38–126)
Anion gap: 11 (ref 5–15)
BUN: 35 mg/dL — ABNORMAL HIGH (ref 8–23)
CO2: 21 mmol/L — ABNORMAL LOW (ref 22–32)
Calcium: 9.4 mg/dL (ref 8.9–10.3)
Chloride: 114 mmol/L — ABNORMAL HIGH (ref 98–111)
Creatinine, Ser: 1.29 mg/dL — ABNORMAL HIGH (ref 0.61–1.24)
GFR calc Af Amer: >60 mL/min (ref >60)
GFR calc non Af Amer: 56 mL/min — ABNORMAL LOW (ref >60)
Glucose, Bld: 222 mg/dL — ABNORMAL HIGH (ref 70–99)
Potassium: 4.8 mmol/L (ref 3.5–5.1)
Sodium: 146 mmol/L — ABNORMAL HIGH (ref 135–145)
Total Bilirubin: 0.7 mg/dL (ref 0.3–1.2)
Total Protein: 7.3 g/dL (ref 6.5–8.1)

## 2019-07-19 LAB — C-REACTIVE PROTEIN: CRP: 5 mg/dL — ABNORMAL HIGH (ref <1.0)

## 2019-07-19 LAB — GLUCOSE, CAPILLARY
Glucose-Capillary: 298 mg/dL — ABNORMAL HIGH (ref 70–99)
Glucose-Capillary: 196 mg/dL — ABNORMAL HIGH (ref 70–99)
Glucose-Capillary: 274 mg/dL — ABNORMAL HIGH (ref 70–99)
Glucose-Capillary: 244 mg/dL — ABNORMAL HIGH (ref 70–99)

## 2019-07-19 LAB — D-DIMER, QUANTITATIVE: D-Dimer, Quant: 2.49 ug/mL-FEU — ABNORMAL HIGH (ref 0.00–0.50)

## 2019-07-19 LAB — BRAIN NATRIURETIC PEPTIDE: B Natriuretic Peptide: 48.1 pg/mL (ref 0.0–100.0)

## 2019-07-19 MED ORDER — CLONIDINE HCL 0.1 MG PO TABS
0.1000 mg | ORAL_TABLET | Freq: Two times a day (BID) | ORAL | Status: DC
  Administered 2019-07-19 – 2019-07-23 (×8): 0.1 mg via ORAL
  Filled 2019-07-19 (×8): qty 1

## 2019-07-19 MED ORDER — DEXTROSE 5 % IV SOLN
INTRAVENOUS | Status: DC
  Administered 2019-07-19 – 2019-07-20 (×2): via INTRAVENOUS

## 2019-07-19 NOTE — Progress Notes (Signed)
PROGRESS NOTE                                                                                                                                                                                                             Patient Demographics:    Lawrence Barnett, is a 68 y.o. male, DOB - 1951-04-03, NTZ:[id number]  Outpatient Primary MD for the patient is Lawrence Rob, FNP    LOS - 2  Admit date - 07/17/2019    CC - Weakness     Brief Narrative -  Lawrence Barnett  is a 68 y.o. male, past medical history of hypertension on HCTZ, possible alcohol abuse, patient was in good health until about 3 to 4 days ago when he started running low-grade fevers, mild diarrhea and a dry cough.  This morning when he was getting ready to go to to work he got lightheaded and fell.  He did not lose consciousness.  Presented to [facility] ER where he was diagnosed with COVID-19 pneumonia, sepsis, dehydration, potential and, AKI, elevated transaminases and he was admitted for further care.    Subjective:   Patient in bed, appears comfortable, denies any headache, no fever, no chest pain or pressure, no shortness of breath , no abdominal pain. No focal weakness.   Assessment  & Plan :     1.  Severe sepsis causing acute hypoxic respiratory failure, dehydration, hypotension and syncope due to COVID-19 pneumonitis.  Sepsis pathophysiology seems to have resolved, continue hydration, placed on IV steroids and remdesivir, he has improved and now down to room air at times, asymptomatic, diarrhea improving continue to monitor.  If needed he has consented for convalescent plasma but refuses to use Actemra.  SpO2: 100 % O2 Flow Rate (L/min): 2 L/min   COVID-19 Labs  Recent Labs    07/17/19 1813 07/18/19 0125 07/19/19 0335  DDIMER  --  6.09* 2.49*  CRP 19.5* 17.2* 5.0*    Lab Results  Component Value Date   SARSCOV2NAA POSITIVE (A) 07/16/2019      Hepatic Function Latest Ref Rng & Units 07/19/2019 07/18/2019 07/17/2019  Total Protein 6.5 - 8.1 g/dL 7.3 7.6 7.9  Albumin 3.5 - 5.0 g/dL 3.0(L) 3.2(L) 3.4(L)  AST 15 - 41 U/L 256(H) 425(H) 540(H)  ALT 0 - 44 U/L 141(H) 157(H) 171(H)  Alk Phosphatase 38 -  126 U/L 39 35(L) 37(L)  Total Bilirubin 0.3 - 1.2 mg/dL 0.7 1.2 1.0    Lab Results  Component Value Date   INR 1.1 07/18/2019   INR 1.1 07/16/2019   INR 1.1 07/16/2019       Component Value Date/Time   BNP 48.1 07/19/2019 0335    2.  Transaminitis - due to combination of COVID-19 infection, hypotension and alcohol use - asymptomatic, monitor trend, monitor synthetic function.  Periodically check INR.  Right upper quadrant ultrasound does show fatty liver for which she will follow with his PCP and quit alcohol use.  Stable synthetic function of liver INR is stable.  3.  Proteinuria and hematuria.  Likely due to acute stress and sepsis, repeat in 7 to 10 days outpatient setting.  CK levels do suggest mild rhabdomyolysis for which we will continue IV fluids for hydration.  4.  Alcohol use.  Counseled to quit.  Says he drinks 1 drink per day, will monitor on CIWA protocol.  No signs of DTs.  5.  Dehydration, hypotension, fall and AKI.  Improving with hydration.  PT OT eval and monitor.  Check CK in the morning.  6.  Hypokalemia.  Replaced and stable.  7.  Hypertension.    Poor outpatient control continue hydralazine, dropped Catapres we will continue to monitor.   8.  Rhabdomyolysis developed at home after the fall.  Continue IV fluids and monitor renal function along with CK levels.   9.  Fatty liver.  Follow with PCP and quit alcohol.    Condition - Fair  Family Communication  :  None  Code Status :  Full  Diet :   Diet Order            Diet Heart Room service appropriate? Yes; Fluid consistency: Thin  Diet effective now               Disposition Plan  :  Home   Consults  :  None  Procedures  :    CT Head - non acute  RUQ [place] - fatty Liver.  PUD Prophylaxis :    DVT Prophylaxis  :  Lovenox    Lab Results  Component Value Date   PLT 193 07/19/2019    Inpatient Medications  Scheduled Meds:  cloNIDine  0.1 mg Oral TID   enoxaparin (LOVENOX) injection  40 mg Subcutaneous W46K   folic acid  1 mg Oral Daily   hydrALAZINE  50 mg Oral Q8H   insulin aspart  0-5 Units Subcutaneous QHS   insulin aspart  0-9 Units Subcutaneous TID WC   LORazepam  0-4 mg Intravenous Q6H   Followed by   LORazepam  0-4 mg Intravenous Q12H   methylPREDNISolone (SOLU-MEDROL) injection  40 mg Intravenous Daily   multivitamin with minerals  1 tablet Oral Daily   sodium chloride flush  3 mL Intravenous Q12H   thiamine  100 mg Oral Daily   Continuous Infusions:  remdesivir 100 mg in NS 250 mL 100 mg (07/19/19 0937)   PRN Meds:.acetaminophen, albuterol, diphenhydrAMINE, loperamide, LORazepam **OR** LORazepam, [DISCONTINUED] ondansetron **OR** ondansetron (ZOFRAN) IV  Antibiotics  :    Anti-infectives (From admission, onward)   Start     Dose/Rate Route Frequency Ordered Stop   07/18/19 1000  remdesivir 100 mg in sodium chloride 0.9 % 250 mL IVPB     100 mg 500 mL/hr over 30 Minutes Intravenous Every 24 hours 07/17/19 1754 07/22/19 0959  Time Spent in minutes  30   Lala Lund M.D on 07/19/2019 at 10:07 AM  To page go to [email or web address] - password [email or web address]  [facility] -  Office  [phone]     See all Orders from today for further details    Objective:   Vitals:   07/18/19 1715 07/18/19 2000 07/19/19 0300 07/19/19 0826  BP: (!) 125/94 129/89 113/73 103/76  Pulse: 88  100 (!) 55  Resp:  20  20  Temp:  98 F (36.7 C) 98.1 F (36.7 C) 98 F (36.7 C)  TempSrc:  Oral Oral Oral  SpO2: 94%  94% 100%  Weight:      Height:        Wt Readings from Last 3 Encounters:  07/17/19 85.5 kg  07/16/19 79.4 kg  03/09/18 86.2 kg     Intake/Output Summary  (Last 24 hours) at 07/19/2019 1007 Last data filed at 07/19/2019 0741 Gross per 24 hour  Intake 2110 ml  Output 1050 ml  Net 1060 ml     Physical Exam  Awake Alert, Oriented X 3, No new F.N deficits, Normal affect Tracy.AT,PERRAL Supple Neck,No JVD, No cervical lymphadenopathy appriciated.  Symmetrical Chest wall movement, Good air movement bilaterally, CTAB RRR,No Gallops, Rubs or new Murmurs, No Parasternal Heave +ve B.Sounds, Abd Soft, No tenderness, No organomegaly appriciated, No rebound - guarding or rigidity. No Cyanosis, Clubbing or edema, No new Rash or bruise    Data Review:    CBC Recent Labs  Lab 07/16/19 1349 07/16/19 2106 07/17/19 1813 07/18/19 0125 07/19/19 0335  WBC 10.4 8.0 4.4 3.4* 6.6  HGB 15.8 14.2 15.3 15.0 14.6  HCT 45.6 41.1 46.4 46.3 45.5  PLT 198 159 180 171 193  MCV 84.3 84.2 89.1 89.2 90.1  MCH 29.2 29.1 29.4 28.9 28.9  MCHC 34.6 34.5 33.0 32.4 32.1  RDW 13.4 13.4 13.9 13.8 14.0  LYMPHSABS 0.6*  --   --  1.1 1.3  MONOABS 0.4  --   --  0.2 0.6  EOSABS 0.0  --   --  0.0 0.0  BASOSABS 0.0  --   --  0.0 0.0    Chemistries  Recent Labs  Lab 07/16/19 1349 07/16/19 2106 07/17/19 1201 07/18/19 0125 07/19/19 0335  NA 141  --  147* 147* 146*  K 2.9*  --  3.1* 4.3 4.8  CL 100  --  118* 117* 114*  CO2 23  --  16* 20* 21*  GLUCOSE 156*  --  112* 240* 222*  BUN 38*  --  27* 30* 35*  CREATININE 1.93* 1.50* 1.36* 1.40* 1.29*  CALCIUM 9.8  --  8.7* 8.8* 9.4  MG  --   --   --  2.2 2.1  AST 716*  --  540* 425* 256*  ALT 205*  --  171* 157* 141*  ALKPHOS 49  --  37* 35* 39  BILITOT 1.4*  --  1.0 1.2 0.7   ------------------------------------------------------------------------------------------------------------------ No results for input(s): CHOL, HDL, LDLCALC, TRIG, CHOLHDL, LDLDIRECT in the last 72 hours.  No results found for:  HGBA1C ------------------------------------------------------------------------------------------------------------------ No results for input(s): TSH, T4TOTAL, T3FREE, THYROIDAB in the last 72 hours.  Invalid input(s): FREET3  Cardiac Enzymes No results for input(s): CKMB, TROPONINI, MYOGLOBIN in the last 168 hours.  Invalid input(s): CK ------------------------------------------------------------------------------------------------------------------    Component Value Date/Time   BNP 48.1 07/19/2019 0335    Micro Results Recent Results (from the past 240 hour(s))  Culture, blood (Routine x  2)     Status: None (Preliminary result)   Collection Time: 07/16/19  1:50 PM   Specimen: BLOOD  Result Value Ref Range Status   Specimen Description BLOOD RIGHT ANTECUBITAL  Final   Special Requests   Final    BOTTLES DRAWN AEROBIC AND ANAEROBIC Blood Culture adequate volume   Culture   Final    NO GROWTH 2 DAYS Performed at [facility], [address] [place], [place] [address]    Report Status PENDING  Incomplete  Culture, blood (Routine x 2)     Status: None (Preliminary result)   Collection Time: 07/16/19  2:58 PM   Specimen: BLOOD  Result Value Ref Range Status   Specimen Description BLOOD BLOOD RIGHT HAND  Final   Special Requests   Final    BOTTLES DRAWN AEROBIC AND ANAEROBIC Blood Culture results may not be optimal due to an inadequate volume of blood received in culture bottles   Culture   Final    NO GROWTH 2 DAYS Performed at [facility], [address] [place],  [place] [address]    Report Status PENDING  Incomplete  SARS CORONAVIRUS 2 (TAT 6-24 HRS) Nasopharyngeal Nasopharyngeal Swab     Status: Abnormal   Collection Time: 07/16/19  3:12 PM   Specimen: Nasopharyngeal Swab  Result Value Ref Range Status   SARS Coronavirus 2 POSITIVE (A) NEGATIVE Final    Comment: RESULT CALLED TO, READ BACK BY AND VERIFIED WITH: A.MCCULLEY EN @ 2505 07/17/2019 BY  SANDY (NOTE) SARS-CoV-2 target nucleic acids are DETECTED. The SARS-CoV-2 RNA is generally detectable in upper and lower respiratory specimens during the acute phase of infection. Positive results are indicative of active infection with SARS-CoV-2. Clinical  correlation with patient history and other diagnostic information is necessary to determine patient infection status. Positive results do  not rule out bacterial infection or co-infection with other viruses. The expected result is Negative. Fact Sheet for Patients: SugarRoll.be Fact Sheet for Healthcare Providers: https://www.woods-mathews.com/ This test is not yet approved or cleared by the [place] FDA and  has been authorized for detection and/or diagnosis of SARS-CoV-2 by FDA under an Emergency Use Authorization (EUA). This EUA will remain  in effect (meaning this test can be used)  for the duration of the COVID-19 declaration under Section 564(b)(1) of the Act, 21 U.S.C. section 360bbb-3(b)(1), unless the authorization is terminated or revoked sooner. Performed at [facility], [place] [address] [place], [place] [address]   Urine culture     Status: Abnormal   Collection Time: 07/16/19  3:12 PM   Specimen: Urine, Clean Catch  Result Value Ref Range Status   Specimen Description   Final    URINE, CLEAN CATCH Performed at [facility], [address] [place], [place] [address]    Special Requests   Final    NONE Performed at [facility], [address] [place], [place] [address]    Culture (A)  Final    10,000 COLONIES/mL DIPHTHEROIDS(CORYNEBACTERIUM SPECIES) Standardized susceptibility testing for this organism is not available. Performed at [facility], [place] [address] [place], [place] [address]    Report Status 07/18/2019 FINAL  Final  MRSA PCR Screening     Status: None   Collection Time: 07/17/19  1:30 PM   Specimen: Nasal Mucosa;  Nasopharyngeal  Result Value Ref Range Status   MRSA by PCR NEGATIVE NEGATIVE Final    Comment:  The GeneXpert MRSA Assay (FDA approved for NASAL specimens only), is one component of a comprehensive MRSA colonization surveillance program. It is not intended to diagnose MRSA infection nor to guide or monitor treatment for MRSA infections. Performed at [facility], [address] [place], [place] [address]     Radiology Reports Ct Head Wo Contrast  Result Date: 07/16/2019 CLINICAL DATA:  Head injury after fall today. Uncertain loss of consciousness. EXAM: CT HEAD WITHOUT CONTRAST TECHNIQUE: Contiguous axial images were obtained from the base of the skull through the vertex without intravenous contrast. COMPARISON:  None. FINDINGS: Brain: Mild chronic ischemic white matter disease is noted. No mass effect or midline shift is noted. Ventricular size is within normal limits. There is no evidence of mass lesion, hemorrhage or acute infarction. Vascular: No hyperdense vessel or unexpected calcification. Skull: Normal. Negative for fracture or focal lesion. Sinuses/Orbits: Bilateral maxillary and left frontal sinusitis is noted. Other: None. IMPRESSION: Mild chronic ischemic white matter disease. No acute intracranial abnormality seen. Bilateral maxillary and left frontal sinusitis. Electronically Signed   By: Marijo Conception M.D.   On: 07/16/2019 14:13   US Abdomen Complete  Result Date: 07/17/2019 CLINICAL DATA:  Transaminitis. EXAM: ABDOMEN ULTRASOUND COMPLETE COMPARISON:  None. FINDINGS: Gallbladder: The gallbladder is partially contracted. No gallstones or wall thickening visualized. No sonographic Murphy sign noted by sonographer. Common bile duct: Diameter: 0.3 cm Liver: No focal lesion is identified. The liver is dense with increased echogenicity. Portal vein is patent on color Doppler imaging with normal direction of blood flow towards the liver. IVC: No abnormality  visualized. Pancreas: Not visualized due to overlying bowel gas. Spleen: Size and appearance within normal limits. Right Kidney: Length: 10.3 cm. Echogenicity within normal limits. No solid mass or hydronephrosis visualized. Simple 2.5 cm cyst noted. Left Kidney: Length: . 12.3 cm. Echogenicity within normal limits. No solid mass or hydronephrosis visualized. Two contiguous cysts versus a cyst with a single thin septation measuring 5.3 cm noted. Abdominal aorta: No aneurysm visualized. Other findings: None. IMPRESSION: No acute abnormality. Diffuse fatty infiltration of the liver. Electronically Signed   By: Inge Rise M.D.   On: 07/17/2019 11:48   Dg Chest Port 1 View  Result Date: 07/16/2019 CLINICAL DATA:  from home due to the family calling when they were unable to get the pt to the door in order to open it and let them in. EMS states that the fire department broke down the door and found the pt in the bathtub. Pt states that he was getting ready for work when he started feeling weak and fell in the tub and hit his head. Pt does not think he lost consciousness. Hx of hypertension. Former smoker EXAM: PORTABLE CHEST 1 VIEW COMPARISON:  None. FINDINGS: Cardiac silhouette normal in size.  No mediastinal or hilar masses. There is opacity in the right lobe, some milder opacity at the left lung base and minimal area of opacity in the left mid lung. This may all be due to scarring. Consider multifocal infection if there are consistent clinical findings. Remainder of the lungs is clear. No pleural effusion or pneumothorax. Skeletal structures are grossly intact. IMPRESSION: 1. Areas of lung opacity as detailed above may reflect chronic scarring. Consider multifocal pneumonia if there are consistent clinical findings. Otherwise, no evidence of acute cardiopulmonary disease. Electronically Signed   By: Lajean Manes M.D.   On: 07/16/2019 13:59

## 2019-07-20 LAB — D-DIMER, QUANTITATIVE: D-Dimer, Quant: 2.1 ug/mL-FEU — ABNORMAL HIGH (ref 0.00–0.50)

## 2019-07-20 LAB — GLUCOSE, CAPILLARY
Glucose-Capillary: 146 mg/dL — ABNORMAL HIGH (ref 70–99)
Glucose-Capillary: 139 mg/dL — ABNORMAL HIGH (ref 70–99)
Glucose-Capillary: 238 mg/dL — ABNORMAL HIGH (ref 70–99)
Glucose-Capillary: 208 mg/dL — ABNORMAL HIGH (ref 70–99)

## 2019-07-20 LAB — CBC WITH DIFFERENTIAL/PLATELET
Abs Immature Granulocytes: 0.06 10*3/uL (ref 0.00–0.07)
Basophils Absolute: 0 10*3/uL (ref 0.0–0.1)
Basophils Relative: 0 %
Eosinophils Absolute: 0 10*3/uL (ref 0.0–0.5)
Eosinophils Relative: 0 %
HCT: 43 % (ref 39.0–52.0)
Hemoglobin: 14 g/dL (ref 13.0–17.0)
Immature Granulocytes: 1 %
Lymphocytes Relative: 21 %
Lymphs Abs: 1.5 10*3/uL (ref 0.7–4.0)
MCH: 29 pg (ref 26.0–34.0)
MCHC: 32.6 g/dL (ref 30.0–36.0)
MCV: 89 fL (ref 80.0–100.0)
Monocytes Absolute: 0.6 10*3/uL (ref 0.1–1.0)
Monocytes Relative: 8 %
Neutro Abs: 4.9 10*3/uL (ref 1.7–7.7)
Neutrophils Relative %: 70 %
Platelets: 185 10*3/uL (ref 150–400)
RBC: 4.83 MIL/uL (ref 4.22–5.81)
RDW: 13.8 % (ref 11.5–15.5)
WBC: 7 10*3/uL (ref 4.0–10.5)
nRBC: 0.3 % — ABNORMAL HIGH (ref 0.0–0.2)

## 2019-07-20 LAB — COMPREHENSIVE METABOLIC PANEL
ALT: 124 U/L — ABNORMAL HIGH (ref 0–44)
AST: 174 U/L — ABNORMAL HIGH (ref 15–41)
Albumin: 3 g/dL — ABNORMAL LOW (ref 3.5–5.0)
Alkaline Phosphatase: 44 U/L (ref 38–126)
Anion gap: 10 (ref 5–15)
BUN: 34 mg/dL — ABNORMAL HIGH (ref 8–23)
CO2: 24 mmol/L (ref 22–32)
Calcium: 9 mg/dL (ref 8.9–10.3)
Chloride: 110 mmol/L (ref 98–111)
Creatinine, Ser: 1.32 mg/dL — ABNORMAL HIGH (ref 0.61–1.24)
GFR calc Af Amer: >60 mL/min (ref >60)
GFR calc non Af Amer: 54 mL/min — ABNORMAL LOW (ref >60)
Glucose, Bld: 182 mg/dL — ABNORMAL HIGH (ref 70–99)
Potassium: 4.4 mmol/L (ref 3.5–5.1)
Sodium: 144 mmol/L (ref 135–145)
Total Bilirubin: 1.2 mg/dL (ref 0.3–1.2)
Total Protein: 6.8 g/dL (ref 6.5–8.1)

## 2019-07-20 LAB — BRAIN NATRIURETIC PEPTIDE: B Natriuretic Peptide: 186.5 pg/mL — ABNORMAL HIGH (ref 0.0–100.0)

## 2019-07-20 LAB — MAGNESIUM: Magnesium: 2.1 mg/dL (ref 1.7–2.4)

## 2019-07-20 LAB — C-REACTIVE PROTEIN: CRP: 1.5 mg/dL — ABNORMAL HIGH (ref <1.0)

## 2019-07-20 LAB — CK: Total CK: 6334 U/L — ABNORMAL HIGH (ref 49–397)

## 2019-07-20 MED ORDER — DEXTROSE 5 % IV SOLN
INTRAVENOUS | Status: DC
  Administered 2019-07-20 – 2019-07-21 (×2): via INTRAVENOUS

## 2019-07-20 NOTE — Progress Notes (Signed)
PROGRESS NOTE                                                                                                                                                                                                             Patient Demographics:    Lawrence Barnett, is a 68 y.o. male, DOB - [birth year], JIZ:[id number]  Outpatient Primary MD for the patient is [name], FNP    LOS - 3  Admit date - 07/17/2019    CC - Weakness     Brief Narrative -  Lawrence Barnett  is a 68 y.o. male, past medical history of hypertension on HCTZ, possible alcohol abuse, patient was in good health until about 3 to 4 days ago when he started running low-grade fevers, mild diarrhea and a dry cough.  This morning when he was getting ready to go to to work he got lightheaded and fell.  He did not lose consciousness.  Presented to [facility] ER where he was diagnosed with COVID-19 pneumonia, sepsis, dehydration, potential and, AKI, elevated transaminases and he was admitted for further care.    Subjective:   Patient in bed, appears comfortable, denies any headache, no fever, no chest pain or pressure, no shortness of breath , no abdominal pain. No focal weakness.    Assessment  & Plan :     1.  Severe sepsis causing acute hypoxic respiratory failure, dehydration, hypotension and syncope due to COVID-19 pneumonitis.  Sepsis pathophysiology seems to have resolved, continue hydration, placed on IV steroids and remdesivir, he has improved and now down to room air at times, asymptomatic, diarrhea improving continue to monitor.  If needed he has consented for convalescent plasma but refuses to use Actemra.  SpO2: 93 % O2 Flow Rate (L/min): 2 L/min   COVID-19 Labs  Recent Labs    07/18/19 0125 07/19/19 0335 07/20/19 0440  DDIMER 6.09* 2.49* 2.10*  CRP 17.2* 5.0* 1.5*    Lab Results  Component Value Date   SARSCOV2NAA POSITIVE (A) 07/16/2019      Hepatic Function Latest Ref Rng & Units 07/20/2019 07/19/2019 07/18/2019  Total Protein 6.5 - 8.1 g/dL 6.8 7.3 7.6  Albumin 3.5 - 5.0 g/dL 3.0(L) 3.0(L) 3.2(L)  AST 15 - 41 U/L 174(H) 256(H) 425(H)  ALT 0 - 44 U/L 124(H) 141(H) 157(H)  Alk Phosphatase 38 -  126 U/L 44 39 35(L)  Total Bilirubin 0.3 - 1.2 mg/dL 1.2 0.7 1.2    Lab Results  Component Value Date   INR 1.1 07/18/2019   INR 1.1 07/16/2019   INR 1.1 07/16/2019       Component Value Date/Time   BNP 186.5 (H) 07/20/2019 0440    2.  Transaminitis - due to combination of COVID-19 infection, hypotension and alcohol use - asymptomatic, monitor trend, monitor synthetic function.  Periodically check INR.  Right upper quadrant ultrasound does show fatty liver for which she will follow with his PCP and quit alcohol use.  Stable synthetic function of liver INR is stable.  3.  Proteinuria and hematuria.  Likely due to acute stress and sepsis, repeat in 7 to 10 days outpatient setting.  CK levels do suggest mild rhabdomyolysis for which we will continue IV fluids for hydration.  4.  Alcohol use.  Counseled to quit.  Says he drinks 1 drink per day, will monitor on CIWA protocol.  No signs of DTs.  5.  Dehydration, hypotension, fall and AKI.  Improving with hydration.  PT OT eval and monitor.  Check CK in the morning.  6.  Hypokalemia.  Replaced and stable.  7.  Hypertension.    Poor outpatient control continue hydralazine, dropped Catapres we will continue to monitor.   8.  Rhabdomyolysis developed at home after the fall.  Continue IV fluids and monitor renal function along with CK levels, trend improving.   9.  Fatty liver.  Follow with PCP and quit alcohol.    Condition - Fair  Family Communication  :  None  Code Status :  Full  Diet :   Diet Order            Diet Heart Room service appropriate? Yes; Fluid consistency: Thin  Diet effective now               Disposition Plan  :  Home   Consults  :   None  Procedures  :   CT Head - non acute  RUQ [place] - fatty Liver.  PUD Prophylaxis :    DVT Prophylaxis  :  Lovenox    Lab Results  Component Value Date   PLT 185 07/20/2019    Inpatient Medications  Scheduled Meds:  cloNIDine  0.1 mg Oral BID   enoxaparin (LOVENOX) injection  40 mg Subcutaneous V89F   folic acid  1 mg Oral Daily   hydrALAZINE  50 mg Oral Q8H   insulin aspart  0-5 Units Subcutaneous QHS   insulin aspart  0-9 Units Subcutaneous TID WC   LORazepam  0-4 mg Intravenous Q12H   methylPREDNISolone (SOLU-MEDROL) injection  40 mg Intravenous Daily   multivitamin with minerals  1 tablet Oral Daily   sodium chloride flush  3 mL Intravenous Q12H   thiamine  100 mg Oral Daily   Continuous Infusions:  dextrose 100 mL/hr at 07/20/19 0320   remdesivir 100 mg in NS 250 mL 100 mg (07/20/19 0951)   PRN Meds:.acetaminophen, albuterol, diphenhydrAMINE, loperamide, LORazepam **OR** LORazepam, [DISCONTINUED] ondansetron **OR** ondansetron (ZOFRAN) IV  Antibiotics  :    Anti-infectives (From admission, onward)   Start     Dose/Rate Route Frequency Ordered Stop   07/18/19 1000  remdesivir 100 mg in sodium chloride 0.9 % 250 mL IVPB     100 mg 500 mL/hr over 30 Minutes Intravenous Every 24 hours 07/17/19 1754 07/22/19 0959  Time Spent in minutes  30   Lala Lund M.D on 07/20/2019 at 9:51 AM  To page go to [email or web address] - password [facility]  [facility] -  Office  [phone]     See all Orders from today for further details    Objective:   Vitals:   07/19/19 1340 07/19/19 1628 07/19/19 1940 07/20/19 0300  BP: 96/75 98/81 125/82 114/75  Pulse:  65 92 87  Resp:  '20 18 18  ' Temp:  98 F (36.7 C) 97.9 F (36.6 C) 98.1 F (36.7 C)  TempSrc:  Oral Oral Oral  SpO2:  100% 100% 93%  Weight:      Height:        Wt Readings from Last 3 Encounters:  07/17/19 85.5 kg  07/16/19 79.4 kg  03/09/18 86.2 kg     Intake/Output Summary  (Last 24 hours) at 07/20/2019 0951 Last data filed at 07/20/2019 0800 Gross per 24 hour  Intake 2049.93 ml  Output 1550 ml  Net 499.93 ml     Physical Exam  Awake Alert, Oriented X 3, No new F.N deficits, Normal affect [place].AT,PERRAL Supple Neck,No JVD, No cervical lymphadenopathy appriciated.  Symmetrical Chest wall movement, Good air movement bilaterally, CTAB RRR,No Gallops, Rubs or new Murmurs, No Parasternal Heave +ve B.Sounds, Abd Soft, No tenderness, No organomegaly appriciated, No rebound - guarding or rigidity. No Cyanosis, Clubbing or edema, No new Rash or bruise   Data Review:    CBC Recent Labs  Lab 07/16/19 1349 07/16/19 2106 07/17/19 1813 07/18/19 0125 07/19/19 0335 07/20/19 0440  WBC 10.4 8.0 4.4 3.4* 6.6 7.0  HGB 15.8 14.2 15.3 15.0 14.6 14.0  HCT 45.6 41.1 46.4 46.3 45.5 43.0  PLT 198 159 180 171 193 185  MCV 84.3 84.2 89.1 89.2 90.1 89.0  MCH 29.2 29.1 29.4 28.9 28.9 29.0  MCHC 34.6 34.5 33.0 32.4 32.1 32.6  RDW 13.4 13.4 13.9 13.8 14.0 13.8  LYMPHSABS 0.6*  --   --  1.1 1.3 1.5  MONOABS 0.4  --   --  0.2 0.6 0.6  EOSABS 0.0  --   --  0.0 0.0 0.0  BASOSABS 0.0  --   --  0.0 0.0 0.0    Chemistries  Recent Labs  Lab 07/16/19 1349 07/16/19 2106 07/17/19 1201 07/18/19 0125 07/19/19 0335 07/20/19 0440  NA 141  --  147* 147* 146* 144  K 2.9*  --  3.1* 4.3 4.8 4.4  CL 100  --  118* 117* 114* 110  CO2 23  --  16* 20* 21* 24  GLUCOSE 156*  --  112* 240* 222* 182*  BUN 38*  --  27* 30* 35* 34*  CREATININE 1.93* 1.50* 1.36* 1.40* 1.29* 1.32*  CALCIUM 9.8  --  8.7* 8.8* 9.4 9.0  MG  --   --   --  2.2 2.1 2.1  AST 716*  --  540* 425* 256* 174*  ALT 205*  --  171* 157* 141* 124*  ALKPHOS 49  --  37* 35* 39 44  BILITOT 1.4*  --  1.0 1.2 0.7 1.2   ------------------------------------------------------------------------------------------------------------------ No results for input(s): CHOL, HDL, LDLCALC, TRIG, CHOLHDL, LDLDIRECT in the last 72  hours.  No results found for: HGBA1C ------------------------------------------------------------------------------------------------------------------ No results for input(s): TSH, T4TOTAL, T3FREE, THYROIDAB in the last 72 hours.  Invalid input(s): FREET3  Cardiac Enzymes No results for input(s): CKMB, TROPONINI, MYOGLOBIN in the last 168 hours.  Invalid input(s): CK ------------------------------------------------------------------------------------------------------------------  Component Value Date/Time   BNP 186.5 (H) 07/20/2019 0440    Micro Results Recent Results (from the past 240 hour(s))  Culture, blood (Routine x 2)     Status: None (Preliminary result)   Collection Time: 07/16/19  1:50 PM   Specimen: BLOOD  Result Value Ref Range Status   Specimen Description BLOOD RIGHT ANTECUBITAL  Final   Special Requests   Final    BOTTLES DRAWN AEROBIC AND ANAEROBIC Blood Culture adequate volume   Culture   Final    NO GROWTH 4 DAYS Performed at [facility], [address], [place], [place] [address]    Report Status PENDING  Incomplete  Culture, blood (Routine x 2)     Status: None (Preliminary result)   Collection Time: 07/16/19  2:58 PM   Specimen: BLOOD  Result Value Ref Range Status   Specimen Description BLOOD BLOOD RIGHT HAND  Final   Special Requests   Final    BOTTLES DRAWN AEROBIC AND ANAEROBIC Blood Culture results may not be optimal due to an inadequate volume of blood received in culture bottles   Culture   Final    NO GROWTH 4 DAYS Performed at [facility], [address] [place], [place] [address]    Report Status PENDING  Incomplete  SARS CORONAVIRUS 2 (TAT 6-24 HRS) Nasopharyngeal Nasopharyngeal Swab     Status: Abnormal   Collection Time: 07/16/19  3:12 PM   Specimen: Nasopharyngeal Swab  Result Value Ref Range Status   SARS Coronavirus 2 POSITIVE (A) NEGATIVE Final    Comment: RESULT CALLED TO, READ BACK BY AND VERIFIED  WITH: A.MCCULLEY EN @ 6948 07/17/2019 BY SANDY (NOTE) SARS-CoV-2 target nucleic acids are DETECTED. The SARS-CoV-2 RNA is generally detectable in upper and lower respiratory specimens during the acute phase of infection. Positive results are indicative of active infection with SARS-CoV-2. Clinical  correlation with patient history and other diagnostic information is necessary to determine patient infection status. Positive results do  not rule out bacterial infection or co-infection with other viruses. The expected result is Negative. Fact Sheet for Patients: SugarRoll.be Fact Sheet for Healthcare Providers: https://www.woods-mathews.com/ This test is not yet approved or cleared by the [place] FDA and  has been authorized for detection and/or diagnosis of SARS-CoV-2 by FDA under an Emergency Use Authorization (EUA). This EUA will remain  in effect (meaning this test can be used)  for the duration of the COVID-19 declaration under Section 564(b)(1) of the Act, 21 U.S.C. section 360bbb-3(b)(1), unless the authorization is terminated or revoked sooner. Performed at [facility], [place] [address] [place], [place] [address]   Urine culture     Status: Abnormal   Collection Time: 07/16/19  3:12 PM   Specimen: Urine, Clean Catch  Result Value Ref Range Status   Specimen Description   Final    URINE, CLEAN CATCH Performed at [facility], [address], [place], [place] [address]    Special Requests   Final    NONE Performed at [facility], [address] [place], [place] [address]    Culture (A)  Final    10,000 COLONIES/mL DIPHTHEROIDS(CORYNEBACTERIUM SPECIES) Standardized susceptibility testing for this organism is not available. Performed at [facility], [place] [address], [place], [place] [address]    Report Status 07/18/2019 FINAL  Final  MRSA PCR Screening     Status: None   Collection Time:  07/17/19  1:30 PM   Specimen: Nasal  Mucosa; Nasopharyngeal  Result Value Ref Range Status   MRSA by PCR NEGATIVE NEGATIVE Final    Comment:        The GeneXpert MRSA Assay (FDA approved for NASAL specimens only), is one component of a comprehensive MRSA colonization surveillance program. It is not intended to diagnose MRSA infection nor to guide or monitor treatment for MRSA infections. Performed at [facility], [address], [name], [place] [address]     Radiology Reports Ct Head Wo Contrast  Result Date: 07/16/2019 CLINICAL DATA:  Head injury after fall today. Uncertain loss of consciousness. EXAM: CT HEAD WITHOUT CONTRAST TECHNIQUE: Contiguous axial images were obtained from the base of the skull through the vertex without intravenous contrast. COMPARISON:  None. FINDINGS: Brain: Mild chronic ischemic white matter disease is noted. No mass effect or midline shift is noted. Ventricular size is within normal limits. There is no evidence of mass lesion, hemorrhage or acute infarction. Vascular: No hyperdense vessel or unexpected calcification. Skull: Normal. Negative for fracture or focal lesion. Sinuses/Orbits: Bilateral maxillary and left frontal sinusitis is noted. Other: None. IMPRESSION: Mild chronic ischemic white matter disease. No acute intracranial abnormality seen. Bilateral maxillary and left frontal sinusitis. Electronically Signed   By: Marijo Conception M.D.   On: 07/16/2019 14:13   US Abdomen Complete  Result Date: 07/17/2019 CLINICAL DATA:  Transaminitis. EXAM: ABDOMEN ULTRASOUND COMPLETE COMPARISON:  None. FINDINGS: Gallbladder: The gallbladder is partially contracted. No gallstones or wall thickening visualized. No sonographic Murphy sign noted by sonographer. Common bile duct: Diameter: 0.3 cm Liver: No focal lesion is identified. The liver is dense with increased echogenicity. Portal vein is patent on color Doppler imaging with normal direction of blood flow  towards the liver. IVC: No abnormality visualized. Pancreas: Not visualized due to overlying bowel gas. Spleen: Size and appearance within normal limits. Right Kidney: Length: 10.3 cm. Echogenicity within normal limits. No solid mass or hydronephrosis visualized. Simple 2.5 cm cyst noted. Left Kidney: Length: . 12.3 cm. Echogenicity within normal limits. No solid mass or hydronephrosis visualized. Two contiguous cysts versus a cyst with a single thin septation measuring 5.3 cm noted. Abdominal aorta: No aneurysm visualized. Other findings: None. IMPRESSION: No acute abnormality. Diffuse fatty infiltration of the liver. Electronically Signed   By: Inge Rise M.D.   On: 07/17/2019 11:48   Dg Chest Port 1 View  Result Date: 07/16/2019 CLINICAL DATA:  from home due to the family calling when they were unable to get the pt to the door in order to open it and let them in. EMS states that the fire department broke down the door and found the pt in the bathtub. Pt states that he was getting ready for work when he started feeling weak and fell in the tub and hit his head. Pt does not think he lost consciousness. Hx of hypertension. Former smoker EXAM: PORTABLE CHEST 1 VIEW COMPARISON:  None. FINDINGS: Cardiac silhouette normal in size.  No mediastinal or hilar masses. There is opacity in the right lobe, some milder opacity at the left lung base and minimal area of opacity in the left mid lung. This may all be due to scarring. Consider multifocal infection if there are consistent clinical findings. Remainder of the lungs is clear. No pleural effusion or pneumothorax. Skeletal structures are grossly intact. IMPRESSION: 1. Areas of lung opacity as detailed above may reflect chronic scarring. Consider multifocal pneumonia if there are consistent clinical findings. Otherwise, no evidence of acute cardiopulmonary disease.  Electronically Signed   By: Lajean Manes M.D.   On: 07/16/2019 13:59

## 2019-07-21 LAB — D-DIMER, QUANTITATIVE: D-Dimer, Quant: 3.22 ug/mL-FEU — ABNORMAL HIGH (ref 0.00–0.50)

## 2019-07-21 LAB — CBC WITH DIFFERENTIAL/PLATELET
Abs Immature Granulocytes: 0.14 10*3/uL — ABNORMAL HIGH (ref 0.00–0.07)
Basophils Absolute: 0 10*3/uL (ref 0.0–0.1)
Basophils Relative: 0 %
Eosinophils Absolute: 0 10*3/uL (ref 0.0–0.5)
Eosinophils Relative: 0 %
HCT: 40.8 % (ref 39.0–52.0)
Hemoglobin: 13.7 g/dL (ref 13.0–17.0)
Immature Granulocytes: 2 %
Lymphocytes Relative: 23 %
Lymphs Abs: 2.1 10*3/uL (ref 0.7–4.0)
MCH: 29.3 pg (ref 26.0–34.0)
MCHC: 33.6 g/dL (ref 30.0–36.0)
MCV: 87.2 fL (ref 80.0–100.0)
Monocytes Absolute: 0.5 10*3/uL (ref 0.1–1.0)
Monocytes Relative: 6 %
Neutro Abs: 6.2 10*3/uL (ref 1.7–7.7)
Neutrophils Relative %: 69 %
Platelets: 202 10*3/uL (ref 150–400)
RBC: 4.68 MIL/uL (ref 4.22–5.81)
RDW: 13.7 % (ref 11.5–15.5)
WBC: 9 10*3/uL (ref 4.0–10.5)
nRBC: 0.6 % — ABNORMAL HIGH (ref 0.0–0.2)

## 2019-07-21 LAB — GLUCOSE, CAPILLARY
Glucose-Capillary: 168 mg/dL — ABNORMAL HIGH (ref 70–99)
Glucose-Capillary: 113 mg/dL — ABNORMAL HIGH (ref 70–99)
Glucose-Capillary: 142 mg/dL — ABNORMAL HIGH (ref 70–99)
Glucose-Capillary: 223 mg/dL — ABNORMAL HIGH (ref 70–99)
Glucose-Capillary: 177 mg/dL — ABNORMAL HIGH (ref 70–99)

## 2019-07-21 LAB — COMPREHENSIVE METABOLIC PANEL
ALT: 117 U/L — ABNORMAL HIGH (ref 0–44)
AST: 130 U/L — ABNORMAL HIGH (ref 15–41)
Albumin: 2.8 g/dL — ABNORMAL LOW (ref 3.5–5.0)
Alkaline Phosphatase: 46 U/L (ref 38–126)
Anion gap: 9 (ref 5–15)
BUN: 28 mg/dL — ABNORMAL HIGH (ref 8–23)
CO2: 22 mmol/L (ref 22–32)
Calcium: 8.7 mg/dL — ABNORMAL LOW (ref 8.9–10.3)
Chloride: 111 mmol/L (ref 98–111)
Creatinine, Ser: 1.05 mg/dL (ref 0.61–1.24)
GFR calc Af Amer: >60 mL/min (ref >60)
GFR calc non Af Amer: >60 mL/min (ref >60)
Glucose, Bld: 143 mg/dL — ABNORMAL HIGH (ref 70–99)
Potassium: 3.6 mmol/L (ref 3.5–5.1)
Sodium: 142 mmol/L (ref 135–145)
Total Bilirubin: 1.3 mg/dL — ABNORMAL HIGH (ref 0.3–1.2)
Total Protein: 6.6 g/dL (ref 6.5–8.1)

## 2019-07-21 LAB — C-REACTIVE PROTEIN: CRP: 4.2 mg/dL — ABNORMAL HIGH (ref <1.0)

## 2019-07-21 LAB — BRAIN NATRIURETIC PEPTIDE: B Natriuretic Peptide: 111.1 pg/mL — ABNORMAL HIGH (ref 0.0–100.0)

## 2019-07-21 LAB — CK: Total CK: 3567 U/L — ABNORMAL HIGH (ref 49–397)

## 2019-07-21 LAB — CULTURE, BLOOD (ROUTINE X 2)
Culture: NO GROWTH
Culture: NO GROWTH
Special Requests: ADEQUATE

## 2019-07-21 LAB — MAGNESIUM: Magnesium: 1.9 mg/dL (ref 1.7–2.4)

## 2019-07-21 MED ORDER — METHYLPREDNISOLONE SODIUM SUCC 40 MG IJ SOLR
20.0000 mg | Freq: Every day | INTRAMUSCULAR | Status: DC
  Administered 2019-07-22: 20 mg via INTRAVENOUS
  Filled 2019-07-21: qty 1

## 2019-07-21 MED ORDER — FUROSEMIDE 10 MG/ML IJ SOLN: 20.0000 mg | Freq: Once | INTRAMUSCULAR | Status: DC

## 2019-07-21 MED ORDER — POTASSIUM CHLORIDE CRYS ER 20 MEQ PO TBCR
40.0000 meq | EXTENDED_RELEASE_TABLET | Freq: Once | ORAL | Status: AC
  Administered 2019-07-21: 12:00:00 40 meq via ORAL
  Filled 2019-07-21: qty 2

## 2019-07-21 MED ORDER — FUROSEMIDE 10 MG/ML IJ SOLN
40.0000 mg | Freq: Once | INTRAMUSCULAR | Status: AC
  Administered 2019-07-21: 12:00:00 40 mg via INTRAVENOUS
  Filled 2019-07-21: qty 4

## 2019-07-21 NOTE — Progress Notes (Addendum)
Physical Therapy Treatment Patient Details Name: Lawrence Barnett MRN: [id number] DOB: 1951/08/20 Today's Date: 07/21/2019    History of Present Illness       68 y.o. male, past medical history of hypertension on HCTZ, possible alcohol abuse, patient was in good health until about 3 to 4 days ago when he started running low-grade fevers, mild diarrhea and a dry cough. When he was getting ready to go to to work he got lightheaded and fell. He did not lose consciousness. Presented to [facility] ER where he was diagnosed with COVID-19 pneumonia, sepsis, dehydration, potential and, AKI, elevated transaminases and he was admitted for   PT Comments    Pt progressing with tx, shows much improvement in independence, activity tolerance and also balance. Pt is at mod I with bed mob and transfers ambulates up to 210ft with SBA-CGA on room air. Pt has been reinforced on incentive spirometer and also flutter valve     Follow Up Recommendations  Home health PT     Equipment Recommendations  None recommended by PT    Recommendations for Other Services     Precautions / Restrictions Precautions Precautions: Fall Restrictions Weight Bearing Restrictions: No    Mobility  Bed Mobility  Transfers Overall transfer level: Needs assistance Equipment used: None Transfers: Sit to/from Stand Sit to Stand: Modified independent (Device/Increase time)  Ambulation/Gait Ambulation/Gait assistance: Supervision Gait Distance (Feet): 250 Feet Assistive device: None Gait Pattern/deviations: Step-through pattern General Gait Details: ambulated on room air   Stairs   Wheelchair Mobility  Modified Rankin (Stroke Patients Only)     Balance Overall balance assessment: Modified Independent Sitting-balance support: Feet unsupported Sitting balance-Leahy Scale: Normal Standing balance support: During functional activity Standing balance-Leahy Scale: Fair    Cognition  Arousal/Alertness: Awake/alert Behavior During Therapy: WFL for tasks assessed/performed Overall Cognitive Status: Within Functional Limits for tasks assessed    Exercises Other Exercises Other Exercises: FV x 10 reps w/ cues Other Exercises: IS x 10 w/ cues pulls 1053ml    General Comments      Pertinent Vitals/Pain Pain Assessment: No/denies pain    Home Living      Prior Function       PT Goals (current goals can now be found in the care plan section) Acute Rehab PT Goals Patient Stated Goal: going home tomorrow PT Goal Formulation: With patient Time For Goal Achievement: 07/28/19 Potential to Achieve Goals: Good Progress towards PT goals: Progressing toward goals    Frequency    Min 3X/week      PT Plan Current plan remains appropriate    Co-evaluation    AM-PAC PT "6 Clicks" Mobility   Outcome Measure  Help needed turning from your back to your side while in a flat bed without using bedrails?: None Help needed moving from lying on your back to sitting on the side of a flat bed without using bedrails?: None Help needed moving to and from a bed to a chair (including a wheelchair)?: A Little Help needed standing up from a chair using your arms (e.g., wheelchair or bedside chair)?: None Help needed to walk in hospital room?: A Little Help needed climbing 3-5 steps with a railing? : A Lot 6 Click Score: 20    End of Session Activity Tolerance: Patient tolerated treatment well Patient left: in chair;with call bell/phone within reach Nurse Communication: Mobility status PT Visit Diagnosis: Other abnormalities of gait and mobility (R26.89);Muscle weakness (generalized) (M62.81)     Time: 9622-2979 PT Time Calculation (  min) (ACUTE ONLY): 16 min  Charges:  $Gait Training: 8-22 mins                                                                                                                                   Drema Pry,  PT    Freddi Starr 07/21/2019, 4:31 PM

## 2019-07-21 NOTE — Progress Notes (Signed)
PROGRESS NOTE                                                                                                                                                                                                             Patient Demographics:    Lawrence Barnett, is a 68 y.o. male, DOB - [birth year], GEZ:[id number]  Outpatient Primary MD for the patient is [name], FNP    LOS - 4  Admit date - 07/17/2019    CC - Weakness     Brief Narrative -  Lawrence Barnett  is a 68 y.o. male, past medical history of hypertension on HCTZ, possible alcohol abuse, patient was in good health until about 3 to 4 days ago when he started running low-grade fevers, mild diarrhea and a dry cough.  This morning when he was getting ready to go to to work he got lightheaded and fell.  He did not lose consciousness.  Presented to [facility] ER where he was diagnosed with COVID-19 pneumonia, sepsis, dehydration, potential and, AKI, elevated transaminases and he was admitted for further care.    Subjective:   Patient in bed, appears comfortable, denies any headache, no fever, no chest pain or pressure, no shortness of breath , no abdominal pain. No focal weakness.   Assessment  & Plan :     1.  Severe sepsis causing acute hypoxic respiratory failure, dehydration, hypotension and syncope due to COVID-19 pneumonitis.  Sepsis pathophysiology seems to have resolved, continue hydration, placed on IV steroids and remdesivir, he has improved and now down to room air, asymptomatic, diarrhea has completely resolved.  He will finish his remdesivir course today and likely discharge tomorrow on room air.   SpO2: 94 % O2 Flow Rate (L/min): 2 L/min   COVID-19 Labs  Recent Labs    07/19/19 0335 07/20/19 0440 07/21/19 0510  DDIMER 2.49* 2.10* 3.22*  CRP 5.0* 1.5* 4.2*    Lab Results  Component Value Date   SARSCOV2NAA POSITIVE (A) 07/16/2019     Hepatic  Function Latest Ref Rng & Units 07/21/2019 07/20/2019 07/19/2019  Total Protein 6.5 - 8.1 g/dL 6.6 6.8 7.3  Albumin 3.5 - 5.0 g/dL 2.8(L) 3.0(L) 3.0(L)  AST 15 - 41 U/L 130(H) 174(H) 256(H)  ALT 0 - 44 U/L 117(H) 124(H) 141(H)  Alk Phosphatase 38 - 126 U/L  46 44 39  Total Bilirubin 0.3 - 1.2 mg/dL 1.3(H) 1.2 0.7    Lab Results  Component Value Date   INR 1.1 07/18/2019   INR 1.1 07/16/2019   INR 1.1 07/16/2019       Component Value Date/Time   BNP 111.1 (H) 07/21/2019 0510    2.  Transaminitis - due to combination of COVID-19 infection, rhabdomyolysis, hypotension and alcohol use - asymptomatic, monitor trend, monitor synthetic function.  Periodically check INR.  Right upper quadrant ultrasound does show fatty liver for which she will follow with his PCP and quit alcohol use.  Stable synthetic function of liver INR is stable.  3.  Proteinuria and hematuria.  Likely due to acute stress and sepsis, repeat in 7 to 10 days outpatient setting.  CK levels do suggest mild rhabdomyolysis for which we will continue IV fluids for hydration.  4.  Alcohol use.  Counseled to quit.  Says he drinks 1 drink per day, will monitor on CIWA protocol.  No signs of DTs.  5.  Dehydration, hypotension, fall and AKI.  Improving with hydration.  PT OT eval and monitor.     6.  Hypokalemia.  Replaced and stable.  7.  Hypertension.    Poor outpatient control continue hydralazine, dropped Catapres we will continue to monitor.   8.  Rhabdomyolysis developed at home after the fall.  Much improved CK levels clinically resolved after IV fluids.   9.  Fatty liver.  Follow with PCP and quit alcohol.    Condition - Fair  Family Communication  :  None  Code Status :  Full  Diet :   Diet Order            Diet Heart Room service appropriate? Yes; Fluid consistency: Thin  Diet effective now               Disposition Plan  :  Home   Consults  :  None  Procedures  :   CT Head - non  acute  RUQ [place] - fatty Liver.  PUD Prophylaxis :    DVT Prophylaxis  :  Lovenox    Lab Results  Component Value Date   PLT 202 07/21/2019    Inpatient Medications  Scheduled Meds:  cloNIDine  0.1 mg Oral BID   enoxaparin (LOVENOX) injection  40 mg Subcutaneous O35K   folic acid  1 mg Oral Daily   hydrALAZINE  50 mg Oral Q8H   insulin aspart  0-5 Units Subcutaneous QHS   insulin aspart  0-9 Units Subcutaneous TID WC   LORazepam  0-4 mg Intravenous Q12H   methylPREDNISolone (SOLU-MEDROL) injection  40 mg Intravenous Daily   multivitamin with minerals  1 tablet Oral Daily   sodium chloride flush  3 mL Intravenous Q12H   thiamine  100 mg Oral Daily   Continuous Infusions:  PRN Meds:.acetaminophen, albuterol, diphenhydrAMINE, loperamide, [DISCONTINUED] ondansetron **OR** ondansetron (ZOFRAN) IV  Antibiotics  :    Anti-infectives (From admission, onward)   Start     Dose/Rate Route Frequency Ordered Stop   07/18/19 1000  remdesivir 100 mg in sodium chloride 0.9 % 250 mL IVPB     100 mg 500 mL/hr over 30 Minutes Intravenous Every 24 hours 07/17/19 1754 07/21/19 0953       Time Spent in minutes  [place] M.D on 07/21/2019 at 11:06 AM  To page go to [email or web address] - password [email or web address]  [facility] -  Office  [phone]     See all Orders from today for further details    Objective:   Vitals:   07/20/19 1659 07/20/19 2000 07/21/19 0500 07/21/19 0810  BP: 94/79 (!) 125/92 109/80 105/73  Pulse: 72 95 100 98  Resp: '18 18 20 16  ' Temp:  97.6 F (36.4 C) 98.1 F (36.7 C) 98.6 F (37 C)  TempSrc:  Oral Oral Oral  SpO2:  93% 93% 94%  Weight:      Height:        Wt Readings from Last 3 Encounters:  07/17/19 85.5 kg  07/16/19 79.4 kg  03/09/18 86.2 kg     Intake/Output Summary (Last 24 hours) at 07/21/2019 1106 Last data filed at 07/21/2019 0831 Gross per 24 hour  Intake 1978.46 ml  Output 1475 ml  Net 503.46 ml     Physical  Exam  Awake Alert, Oriented X 3, No new F.N deficits, Normal affect [place].AT,PERRAL Supple Neck,No JVD, No cervical lymphadenopathy appriciated.  Symmetrical Chest wall movement, Good air movement bilaterally, CTAB RRR,No Gallops, Rubs or new Murmurs, No Parasternal Heave +ve B.Sounds, Abd Soft, No tenderness, No organomegaly appriciated, No rebound - guarding or rigidity. No Cyanosis, Clubbing or edema, No new Rash or bruise    Data Review:    CBC Recent Labs  Lab 07/16/19 1349  07/17/19 1813 07/18/19 0125 07/19/19 0335 07/20/19 0440 07/21/19 0510  WBC 10.4   < > 4.4 3.4* 6.6 7.0 9.0  HGB 15.8   < > 15.3 15.0 14.6 14.0 13.7  HCT 45.6   < > 46.4 46.3 45.5 43.0 40.8  PLT 198   < > 180 171 193 185 202  MCV 84.3   < > 89.1 89.2 90.1 89.0 87.2  MCH 29.2   < > 29.4 28.9 28.9 29.0 29.3  MCHC 34.6   < > 33.0 32.4 32.1 32.6 33.6  RDW 13.4   < > 13.9 13.8 14.0 13.8 13.7  LYMPHSABS 0.6*  --   --  1.1 1.3 1.5 2.1  MONOABS 0.4  --   --  0.2 0.6 0.6 0.5  EOSABS 0.0  --   --  0.0 0.0 0.0 0.0  BASOSABS 0.0  --   --  0.0 0.0 0.0 0.0   < > = values in this interval not displayed.    Chemistries  Recent Labs  Lab 07/17/19 1201 07/18/19 0125 07/19/19 0335 07/20/19 0440 07/21/19 0510  NA 147* 147* 146* 144 142  K 3.1* 4.3 4.8 4.4 3.6  CL 118* 117* 114* 110 111  CO2 16* 20* 21* 24 22  GLUCOSE 112* 240* 222* 182* 143*  BUN 27* 30* 35* 34* 28*  CREATININE 1.36* 1.40* 1.29* 1.32* 1.05  CALCIUM 8.7* 8.8* 9.4 9.0 8.7*  MG  --  2.2 2.1 2.1 1.9  AST 540* 425* 256* 174* 130*  ALT 171* 157* 141* 124* 117*  ALKPHOS 37* 35* 39 44 46  BILITOT 1.0 1.2 0.7 1.2 1.3*   ------------------------------------------------------------------------------------------------------------------ No results for input(s): CHOL, HDL, LDLCALC, TRIG, CHOLHDL, LDLDIRECT in the last 72 hours.  No results found for:  HGBA1C ------------------------------------------------------------------------------------------------------------------ No results for input(s): TSH, T4TOTAL, T3FREE, THYROIDAB in the last 72 hours.  Invalid input(s): FREET3  Cardiac Enzymes No results for input(s): CKMB, TROPONINI, MYOGLOBIN in the last 168 hours.  Invalid input(s): CK ------------------------------------------------------------------------------------------------------------------    Component Value Date/Time   BNP 111.1 (H) 07/21/2019 0510    Micro Results Recent Results (from the past 240 hour(s))  Culture, blood (Routine x 2)     Status: None   Collection Time: 07/16/19  1:50 PM   Specimen: BLOOD  Result Value Ref Range Status   Specimen Description BLOOD RIGHT ANTECUBITAL  Final   Special Requests   Final    BOTTLES DRAWN AEROBIC AND ANAEROBIC Blood Culture adequate volume   Culture   Final    NO GROWTH 5 DAYS Performed at [facility], [address] [place], [place] [address]    Report Status 07/21/2019 FINAL  Final  Culture, blood (Routine x 2)     Status: None   Collection Time: 07/16/19  2:58 PM   Specimen: BLOOD  Result Value Ref Range Status   Specimen Description BLOOD BLOOD RIGHT HAND  Final   Special Requests   Final    BOTTLES DRAWN AEROBIC AND ANAEROBIC Blood Culture results may not be optimal due to an inadequate volume of blood received in culture bottles   Culture   Final    NO GROWTH 5 DAYS Performed at [facility], [address], [place], [place] [address]    Report Status 07/21/2019 FINAL  Final  SARS CORONAVIRUS 2 (TAT 6-24 HRS) Nasopharyngeal Nasopharyngeal Swab     Status: Abnormal   Collection Time: 07/16/19  3:12 PM   Specimen: Nasopharyngeal Swab  Result Value Ref Range Status   SARS Coronavirus 2 POSITIVE (A) NEGATIVE Final    Comment: RESULT CALLED TO, READ BACK BY AND VERIFIED WITH: A.MCCULLEY EN @ [id number] 07/17/2019 BY SANDY (NOTE) SARS-CoV-2  target nucleic acids are DETECTED. The SARS-CoV-2 RNA is generally detectable in upper and lower respiratory specimens during the acute phase of infection. Positive results are indicative of active infection with SARS-CoV-2. Clinical  correlation with patient history and other diagnostic information is necessary to determine patient infection status. Positive results do  not rule out bacterial infection or co-infection with other viruses. The expected result is Negative. Fact Sheet for Patients: SugarRoll.be Fact Sheet for Healthcare Providers: https://www.woods-mathews.com/ This test is not yet approved or cleared by the [place] FDA and  has been authorized for detection and/or diagnosis of SARS-CoV-2 by FDA under an Emergency Use Authorization (EUA). This EUA will remain  in effect (meaning this test can be used)  for the duration of the COVID-19 declaration under Section 564(b)(1) of the Act, 21 U.S.C. section 360bbb-3(b)(1), unless the authorization is terminated or revoked sooner. Performed at [facility], [place] [address], [place], [place] [address]   Urine culture     Status: Abnormal   Collection Time: 07/16/19  3:12 PM   Specimen: Urine, Clean Catch  Result Value Ref Range Status   Specimen Description   Final    URINE, CLEAN CATCH Performed at [facility], [address] [place], [place] [address]    Special Requests   Final    NONE Performed at [facility], [address] [place], [place] [address]    Culture (A)  Final    10,000 COLONIES/mL DIPHTHEROIDS(CORYNEBACTERIUM SPECIES) Standardized susceptibility testing for this organism is not available. Performed at [facility], [place] [address] [place], [place] [address]    Report Status 07/18/2019 FINAL  Final  MRSA PCR Screening     Status: None   Collection Time: 07/17/19  1:30 PM   Specimen: Nasal Mucosa; Nasopharyngeal  Result  Value Ref Range Status   MRSA by PCR NEGATIVE NEGATIVE Final    Comment:  The GeneXpert MRSA Assay (FDA approved for NASAL specimens only), is one component of a comprehensive MRSA colonization surveillance program. It is not intended to diagnose MRSA infection nor to guide or monitor treatment for MRSA infections. Performed at [facility], [address], [place], [place] [address]     Radiology Reports Ct Head Wo Contrast  Result Date: 07/16/2019 CLINICAL DATA:  Head injury after fall today. Uncertain loss of consciousness. EXAM: CT HEAD WITHOUT CONTRAST TECHNIQUE: Contiguous axial images were obtained from the base of the skull through the vertex without intravenous contrast. COMPARISON:  None. FINDINGS: Brain: Mild chronic ischemic white matter disease is noted. No mass effect or midline shift is noted. Ventricular size is within normal limits. There is no evidence of mass lesion, hemorrhage or acute infarction. Vascular: No hyperdense vessel or unexpected calcification. Skull: Normal. Negative for fracture or focal lesion. Sinuses/Orbits: Bilateral maxillary and left frontal sinusitis is noted. Other: None. IMPRESSION: Mild chronic ischemic white matter disease. No acute intracranial abnormality seen. Bilateral maxillary and left frontal sinusitis. Electronically Signed   By: Marijo Conception M.D.   On: 07/16/2019 14:13   US Abdomen Complete  Result Date: 07/17/2019 CLINICAL DATA:  Transaminitis. EXAM: ABDOMEN ULTRASOUND COMPLETE COMPARISON:  None. FINDINGS: Gallbladder: The gallbladder is partially contracted. No gallstones or wall thickening visualized. No sonographic Murphy sign noted by sonographer. Common bile duct: Diameter: 0.3 cm Liver: No focal lesion is identified. The liver is dense with increased echogenicity. Portal vein is patent on color Doppler imaging with normal direction of blood flow towards the liver. IVC: No abnormality visualized. Pancreas: Not  visualized due to overlying bowel gas. Spleen: Size and appearance within normal limits. Right Kidney: Length: 10.3 cm. Echogenicity within normal limits. No solid mass or hydronephrosis visualized. Simple 2.5 cm cyst noted. Left Kidney: Length: . 12.3 cm. Echogenicity within normal limits. No solid mass or hydronephrosis visualized. Two contiguous cysts versus a cyst with a single thin septation measuring 5.3 cm noted. Abdominal aorta: No aneurysm visualized. Other findings: None. IMPRESSION: No acute abnormality. Diffuse fatty infiltration of the liver. Electronically Signed   By: Inge Rise M.D.   On: 07/17/2019 11:48   Dg Chest Port 1 View  Result Date: 07/16/2019 CLINICAL DATA:  from home due to the family calling when they were unable to get the pt to the door in order to open it and let them in. EMS states that the fire department broke down the door and found the pt in the bathtub. Pt states that he was getting ready for work when he started feeling weak and fell in the tub and hit his head. Pt does not think he lost consciousness. Hx of hypertension. Former smoker EXAM: PORTABLE CHEST 1 VIEW COMPARISON:  None. FINDINGS: Cardiac silhouette normal in size.  No mediastinal or hilar masses. There is opacity in the right lobe, some milder opacity at the left lung base and minimal area of opacity in the left mid lung. This may all be due to scarring. Consider multifocal infection if there are consistent clinical findings. Remainder of the lungs is clear. No pleural effusion or pneumothorax. Skeletal structures are grossly intact. IMPRESSION: 1. Areas of lung opacity as detailed above may reflect chronic scarring. Consider multifocal pneumonia if there are consistent clinical findings. Otherwise, no evidence of acute cardiopulmonary disease. Electronically Signed   By: Lajean Manes M.D.   On: 07/16/2019 13:59

## 2019-07-22 DIAGNOSIS — R7401 Elevation of levels of liver transaminase levels: Secondary | ICD-10-CM

## 2019-07-22 DIAGNOSIS — R319 Hematuria, unspecified: Secondary | ICD-10-CM

## 2019-07-22 DIAGNOSIS — R6520 Severe sepsis without septic shock: Secondary | ICD-10-CM

## 2019-07-22 DIAGNOSIS — J9601 Acute respiratory failure with hypoxia: Secondary | ICD-10-CM

## 2019-07-22 DIAGNOSIS — A419 Sepsis, unspecified organism: Secondary | ICD-10-CM

## 2019-07-22 DIAGNOSIS — R809 Proteinuria, unspecified: Secondary | ICD-10-CM

## 2019-07-22 DIAGNOSIS — E876 Hypokalemia: Secondary | ICD-10-CM

## 2019-07-22 LAB — GLUCOSE, CAPILLARY
Glucose-Capillary: 111 mg/dL — ABNORMAL HIGH (ref 70–99)
Glucose-Capillary: 181 mg/dL — ABNORMAL HIGH (ref 70–99)
Glucose-Capillary: 206 mg/dL — ABNORMAL HIGH (ref 70–99)
Glucose-Capillary: 188 mg/dL — ABNORMAL HIGH (ref 70–99)

## 2019-07-22 MED ORDER — METHYLPREDNISOLONE SODIUM SUCC 40 MG IJ SOLR
10.0000 mg | Freq: Every day | INTRAMUSCULAR | Status: DC
  Administered 2019-07-23: 11:00:00 10 mg via INTRAVENOUS
  Filled 2019-07-22: qty 1

## 2019-07-22 MED ORDER — SODIUM CHLORIDE 0.9 % IV SOLN
INTRAVENOUS | Status: DC
  Administered 2019-07-22 – 2019-07-23 (×2): via INTRAVENOUS

## 2019-07-22 NOTE — TOC Initial Note (Signed)
Transition of Care [facility]) - Initial/Assessment Note    Patient Details  Name: Lawrence Barnett MRN: [id number] Date of Birth: 20-Dec-1950  Transition of Care [facility]) CM/SW Contact:    Ninfa Meeker, RN Phone Number: 07/22/2019, 12:34 PM  Clinical Narrative:  Patient is a 68 y.o. male with past medical history of  Hypertension, was in good health until 3 to 4 days ago when he started running a low-grade fever, diarrhea and a dry cough.  On the day of admission he got up and felt lightheaded did not lose consciousness went to [facility] ER was diagnosed with COVID-19, sepsis dehydration and acute renal failure and was transferred to [facility] for further treatment. Case manager spoke with patient via telephone to discuss needs at discharge. Choice for Home Health agency offered, patient has no preference, Referral called to [place], Liaison with [facility] (formerly [place] [facility]). Patient says he lives alone, was independent prior to hospital admission, plans to return home.       Expected Discharge Plan: [place] Barriers to Discharge: Continued Medical Work up   Patient Goals and CMS Choice Patient states their goals for this hospitalization and ongoing recovery are:: get better CMS Medicare.gov Compare Post Acute Care list provided to:: Patient(verbally offered choice)    Expected Discharge Plan and Services Expected Discharge Plan: [place]   Discharge Planning Services: CM Consult   Living arrangements for the past 2 months: [employer] Arranged: PT, OT [place]: [place] ([place]) Date [place]: 07/22/19 Time [place]: 1232 Representative spoke with at [place]: [place]  Prior Living Arrangements/Services Living arrangements for the past 2 months: [place] with:: Self Patient language and need for interpreter  reviewed:: Yes Do you feel safe going back to the place where you live?: Yes      Need for Family Participation in Patient Care: No (Comment) Care giver support system in place?: Yes (comment)(has family)   Criminal Activity/Legal Involvement Pertinent to Current Situation/Hospitalization: No - Comment as needed  Activities of Daily Living Home Assistive Devices/Equipment: None ADL Screening (condition at time of admission) Patient's cognitive ability adequate to safely complete daily activities?: Yes Is the patient deaf or have difficulty hearing?: No Does the patient have difficulty seeing, even when wearing glasses/contacts?: No Does the patient have difficulty concentrating, remembering, or making decisions?: No Patient able to express need for assistance with ADLs?: Yes Does the patient have difficulty dressing or bathing?: No Independently performs ADLs?: Yes (appropriate for developmental age) Does the patient have difficulty walking or climbing stairs?: No Weakness of Legs: None Weakness of Arms/Hands: None  Permission Sought/Granted Permission sought to share information with : Case Manager       Permission granted to share info w AGENCY: [facility]        Emotional Assessment   Attitude/Demeanor/Rapport: Gracious   Orientation: : Oriented to Self, Oriented to Place, Oriented to  Time, Oriented to Situation Alcohol / Substance Use: Not Applicable Psych Involvement: No (comment)  Admission diagnosis:  [place] Patient Active Problem List   Diagnosis Date Noted  . Acute respiratory failure with hypoxia ([facility]) 07/22/2019  . Proteinuria 07/22/2019  . Hematuria 07/22/2019  . COVID-19 virus infection 07/17/2019  . Metabolic acidosis  07/17/2019  . Severe sepsis (H[facility]) 07/16/2019  . Lobar pneumonia (HCC) 07/16/2019  . Transaminitis 07/16/2019  . Alcohol abuse 07/16/2019  . ATN (acute tubular necrosis) (HCC) 07/16/2019  . Hypokalemia 07/16/2019  . Syncope and  collapse 07/16/2019   PCP:  Garnet Koyanagi, FNP Pharmacy:  No Pharmacies Listed    Social Determinants of Health (SDOH) Interventions    Readmission Risk Interventions No flowsheet data found.

## 2019-07-22 NOTE — Progress Notes (Signed)
Physical Therapy Treatment Patient Details Name: Lawrence Barnett MRN: [id number] DOB: [birth year] Today's Date: 07/22/2019    History of Present Illness 68 y.o. male, past medical history of hypertension on HCTZ, possible alcohol abuse, patient was in good health until about 3 to 4 days ago when he started running low-grade fevers, mild diarrhea and a dry cough. When he was getting ready to go to to work he got lightheaded and fell.  He did not lose consciousness.  Presented to [facility] ER where he was diagnosed with COVID-19 pneumonia, sepsis, dehydration, potential and, AKI, elevated transaminases and he was admitted for further care.     PT Comments    The patient reports up ad lib in room. Does not desire to use Rw, did push IV pole. Patient hopes DC soon. Most likely does not need RW.    Follow Up Recommendations  Home health PT     Equipment Recommendations  Rolling walker with 5" wheels(may not need)    Recommendations for Other Services       Precautions / Restrictions Precautions Precautions: None    Mobility  Bed Mobility Overal bed mobility: Independent                Transfers Overall transfer level: Independent                  Ambulation/Gait Ambulation/Gait assistance: Supervision Gait Distance (Feet): 125 Feet Assistive device: IV Pole Gait Pattern/deviations: Step-through pattern Gait velocity: decr   General Gait Details: patient reports ad lib in room. ambuulated  with IV pole.   Stairs             Wheelchair Mobility    Modified Rankin (Stroke Patients Only)       Balance           Standing balance support: No upper extremity supported;During functional activity Standing balance-Leahy Scale: Fair                              Cognition Arousal/Alertness: Awake/alert                                            Exercises      General Comments        Pertinent Vitals/Pain Pain  Assessment: No/denies pain    Home Living                      Prior Function            PT Goals (current goals can now be found in the care plan section) Progress towards PT goals: Progressing toward goals    Frequency    Min 3X/week      PT Plan Current plan remains appropriate    Co-evaluation              AM-PAC PT "6 Clicks" Mobility   Outcome Measure  Help needed turning from your back to your side while in a flat bed without using bedrails?: None Help needed moving from lying on your back to sitting on the side of a flat bed without using bedrails?: None Help needed moving to and from a bed to a chair (including a wheelchair)?: None Help needed standing up from a chair using your arms (e.g., wheelchair or bedside chair)?: None Help  needed to walk in hospital room?: None Help needed climbing 3-5 steps with a railing? : A Little 6 Click Score: 23    End of Session   Activity Tolerance: Patient tolerated treatment well Patient left: in chair Nurse Communication: Mobility status PT Visit Diagnosis: Difficulty in walking, not elsewhere classified (R26.2)     Time: 1530-1605 PT Time Calculation (min) (ACUTE ONLY): 35 min  Charges:  $Gait Training: 23-37 mins                     Blanchard Kelch PT Acute Rehabilitation Services Pager [phone] Office [phone]    Rada Hay 07/22/2019, 5:42 PM

## 2019-07-22 NOTE — Progress Notes (Signed)
[facility] PROGRESS NOTE    Progress Note  Lawrence Barnett  ZHG:[id number] DOB: 20-Jul-1951 DOA: 07/17/2019 PCP: Lawrence Koyanagi, FNP     Brief Narrative:   Lawrence Barnett is an 68 y.o. male past medical history of essential hypertension on hydrochlorothiazide, possible alcohol abuse was in good health until 3 to 4 days ago when he started running a low-grade fever, diarrhea and a dry cough.  On the day of admission he got up and felt lightheaded did not lose consciousness went to [facility] ER was diagnosed with COVID-19, sepsis dehydration and acute renal failure and was transferred to [facility].  Assessment/Plan:   Severe sepsis due to COVID-19: He was started on fluid resuscitation and his blood pressure has improved as well as endorgan damage. Blood pressure is fairly controlled we will continue to monitor closely he is not tachycardic.  Acute respiratory failure with hypoxia due to COVID-19 virus infection: He is currently satting greater 93% on room air. Has completed his course of IV remdesivir and steroids. Inflammatory markers are improving.  Transaminitis: Multifactorial in the setting of COVID-19, rhabdomyolysis and hypotension. Right upper quadrant ultrasound showed no stone but did show fatty liver. Liver functions are stable.  Proteinuria and hematuria: Repeat in 7 to 10 days as an outpatient.  Alcohol abuse: He was counseled, he was monitored with CIWA protocol no signs of DTs.  Dehydration: Resolved with IV fluid hydration.  Hypokalemia: Repleted orally..  Essential hypertension: Currently on clonidine and hydralazine.  Rhabdomyolysis: After fall at home nontraumatic, levels are improved but still elevated we will continue IV fluids recheck a CK in the morning.  Fatty liver: Follow-up with PCP as an outpatient.   DVT prophylaxis: lovenox Family Communication:none Disposition Plan/Barrier to D/C: home in am Code Status:      Code Status Orders  (From admission, onward)         Start     Ordered   07/17/19 1745  Full code  Continuous     07/17/19 1745        Code Status History    Date Active Date Inactive Code Status Order ID Comments User Context   07/16/2019 2104 07/17/2019 1718 Full Code [id number]  Lawrence North, MD ED   Advance Care Planning Activity        IV Access:    Peripheral IV   Procedures and diagnostic studies:   No results found.   Medical Consultants:    None.  Anti-Infectives:   None  Subjective:    Lawrence Barnett he relates he feels much better than yesterday.  Objective:    Vitals:   07/21/19 1655 07/21/19 2012 07/22/19 0434 07/22/19 0727  BP: 116/80 103/83 104/75 102/71  Pulse: 88 92 89 98  Resp: 18 19 15 16   Temp: 98.7 F (37.1 C) 99.2 F (37.3 C) 98.5 F (36.9 C) 98.7 F (37.1 C)  TempSrc: Oral Oral Oral Oral  SpO2: 99% 96% 95% 97%  Weight:      Height:       SpO2: 97 % O2 Flow Rate (L/min): 2 L/min   Intake/Output Summary (Last 24 hours) at 07/22/2019 0823 Last data filed at 07/22/2019 0438 Gross per 24 hour  Intake 700 ml  Output 1875 ml  Net -1175 ml   Filed Weights   07/17/19 1951  Weight: 85.5 kg    Exam: General exam: In no acute distress. Respiratory system: Good air movement and crackles at bases. Cardiovascular system: S1 &  S2 heard, RRR. No JVD. Gastrointestinal system: Abdomen is nondistended, soft and nontender.  Central nervous system: Alert and oriented. No focal neurological deficits. Extremities: No pedal edema. Skin: No rashes, lesions or ulcers Psychiatry: Judgement and insight appear normal. Mood & affect appropriate.   Data Reviewed:    Labs: Basic Metabolic Panel: Recent Labs  Lab 07/17/19 1201 07/18/19 0125 07/19/19 0335 07/20/19 0440 07/21/19 0510  NA 147* 147* 146* 144 142  K 3.1* 4.3 4.8 4.4 3.6  CL 118* 117* 114* 110 111  CO2 16* 20* 21* 24 22  GLUCOSE 112* 240* 222* 182* 143*   BUN 27* 30* 35* 34* 28*  CREATININE 1.36* 1.40* 1.29* 1.32* 1.05  CALCIUM 8.7* 8.8* 9.4 9.0 8.7*  MG  --  2.2 2.1 2.1 1.9   GFR Estimated Creatinine Clearance: 71.9 mL/min (by C-G formula based on SCr of 1.05 mg/dL). Liver Function Tests: Recent Labs  Lab 07/17/19 1201 07/18/19 0125 07/19/19 0335 07/20/19 0440 07/21/19 0510  AST 540* 425* 256* 174* 130*  ALT 171* 157* 141* 124* 117*  ALKPHOS 37* 35* 39 44 46  BILITOT 1.0 1.2 0.7 1.2 1.3*  PROT 7.9 7.6 7.3 6.8 6.6  ALBUMIN 3.4* 3.2* 3.0* 3.0* 2.8*   No results for input(s): LIPASE, AMYLASE in the last 168 hours. Recent Labs  Lab 07/16/19 1458  AMMONIA 25   Coagulation profile Recent Labs  Lab 07/16/19 1349 07/16/19 2106 07/18/19 0125  INR 1.1 1.1 1.1   COVID-19 Labs  Recent Labs    07/20/19 0440 07/21/19 0510  DDIMER 2.10* 3.22*  CRP 1.5* 4.2*    Lab Results  Component Value Date   SARSCOV2NAA POSITIVE (A) 07/16/2019    CBC: Recent Labs  Lab 07/16/19 1349  07/17/19 1813 07/18/19 0125 07/19/19 0335 07/20/19 0440 07/21/19 0510  WBC 10.4   < > 4.4 3.4* 6.6 7.0 9.0  NEUTROABS 9.4*  --   --  2.1 4.6 4.9 6.2  HGB 15.8   < > 15.3 15.0 14.6 14.0 13.7  HCT 45.6   < > 46.4 46.3 45.5 43.0 40.8  MCV 84.3   < > 89.1 89.2 90.1 89.0 87.2  PLT 198   < > 180 171 193 185 202   < > = values in this interval not displayed.   Cardiac Enzymes: Recent Labs  Lab 07/18/19 0125 07/19/19 0335 07/20/19 0440 07/21/19 0510  CKTOTAL 1,375* 11,367* 6,334* 3,567*   BNP (last 3 results) No results for input(s): PROBNP in the last 8760 hours. CBG: Recent Labs  Lab 07/20/19 2217 07/21/19 0747 07/21/19 1138 07/21/19 1554 07/21/19 2151  GLUCAP 208* 113* 142* 223* 177*   D-Dimer: Recent Labs    07/20/19 0440 07/21/19 0510  DDIMER 2.10* 3.22*   Hgb A1c: No results for input(s): HGBA1C in the last 72 hours. Lipid Profile: No results for input(s): CHOL, HDL, LDLCALC, TRIG, CHOLHDL, LDLDIRECT in the last 72  hours. Thyroid function studies: No results for input(s): TSH, T4TOTAL, T3FREE, THYROIDAB in the last 72 hours.  Invalid input(s): FREET3 Anemia work up: No results for input(s): VITAMINB12, FOLATE, FERRITIN, TIBC, IRON, RETICCTPCT in the last 72 hours. Sepsis Labs: Recent Labs  Lab 07/16/19 1349 07/16/19 1458 07/16/19 1902 07/16/19 2103  07/18/19 0125 07/19/19 0335 07/20/19 0440 07/21/19 0510  PROCALCITON 21.06  --   --   --   --   --   --   --   --   WBC 10.4  --   --   --    < >  3.4* 6.6 7.0 9.0  LATICACIDVEN 3.8* 3.9* 2.4* 2.2*  --   --   --   --   --    < > = values in this interval not displayed.   Microbiology Recent Results (from the past 240 hour(s))  Culture, blood (Routine x 2)     Status: None   Collection Time: 07/16/19  1:50 PM   Specimen: BLOOD  Result Value Ref Range Status   Specimen Description BLOOD RIGHT ANTECUBITAL  Final   Special Requests   Final    BOTTLES DRAWN AEROBIC AND ANAEROBIC Blood Culture adequate volume   Culture   Final    NO GROWTH 5 DAYS Performed at [facility], [address] [place], [place] [address]    Report Status 07/21/2019 FINAL  Final  Culture, blood (Routine x 2)     Status: None   Collection Time: 07/16/19  2:58 PM   Specimen: BLOOD  Result Value Ref Range Status   Specimen Description BLOOD BLOOD RIGHT HAND  Final   Special Requests   Final    BOTTLES DRAWN AEROBIC AND ANAEROBIC Blood Culture results may not be optimal due to an inadequate volume of blood received in culture bottles   Culture   Final    NO GROWTH 5 DAYS Performed at [facility], [address], [place], [place] [address]    Report Status 07/21/2019 FINAL  Final  SARS CORONAVIRUS 2 (TAT 6-24 HRS) Nasopharyngeal Nasopharyngeal Swab     Status: Abnormal   Collection Time: 07/16/19  3:12 PM   Specimen: Nasopharyngeal Swab  Result Value Ref Range Status   SARS Coronavirus 2 POSITIVE (A) NEGATIVE Final    Comment: RESULT CALLED  TO, READ BACK BY AND VERIFIED WITH: A.MCCULLEY EN @ 1210 07/17/2019 BY SANDY (NOTE) SARS-CoV-2 target nucleic acids are DETECTED. The SARS-CoV-2 RNA is generally detectable in upper and lower respiratory specimens during the acute phase of infection. Positive results are indicative of active infection with SARS-CoV-2. Clinical  correlation with patient history and other diagnostic information is necessary to determine patient infection status. Positive results do  not rule out bacterial infection or co-infection with other viruses. The expected result is Negative. Fact Sheet for Patients: HairSlick.no Fact Sheet for Healthcare Providers: quierodirigir.com This test is not yet approved or cleared by the [place] FDA and  has been authorized for detection and/or diagnosis of SARS-CoV-2 by FDA under an Emergency Use Authorization (EUA). This EUA will remain  in effect (meaning this test can be used)  for the duration of the COVID-19 declaration under Section 564(b)(1) of the Act, 21 U.S.C. section 360bbb-3(b)(1), unless the authorization is terminated or revoked sooner. Performed at [facility], [address] [place], [place] [address]   Urine culture     Status: Abnormal   Collection Time: 07/16/19  3:12 PM   Specimen: Urine, Clean Catch  Result Value Ref Range Status   Specimen Description   Final    URINE, CLEAN CATCH Performed at [facility], [address], [place], [place] [address]    Special Requests   Final    NONE Performed at [facility], [address] [place], [place] [address]    Culture (A)  Final    10,000 COLONIES/mL DIPHTHEROIDS(CORYNEBACTERIUM SPECIES) Standardized susceptibility testing for this organism is not available. Performed at [facility], [address], [place], [place] [address]    Report Status 07/18/2019 FINAL  Final  MRSA PCR Screening     Status:  None   Collection Time: 07/17/19  1:30 PM   Specimen: Nasal Mucosa; Nasopharyngeal  Result Value Ref Range Status   MRSA by PCR NEGATIVE NEGATIVE Final    Comment:        The GeneXpert MRSA Assay (FDA approved for NASAL specimens only), is one component of a comprehensive MRSA colonization surveillance program. It is not intended to diagnose MRSA infection nor to guide or monitor treatment for MRSA infections. Performed at [facility], [address] [place], [place] [address]      Medications:   . cloNIDine  0.1 mg Oral BID  . enoxaparin (LOVENOX) injection  40 mg Subcutaneous Q24H  . folic acid  1 mg Oral Daily  . hydrALAZINE  50 mg Oral Q8H  . insulin aspart  0-5 Units Subcutaneous QHS  . insulin aspart  0-9 Units Subcutaneous TID WC  . methylPREDNISolone (SOLU-MEDROL) injection  20 mg Intravenous Daily  . multivitamin with minerals  1 tablet Oral Daily  . sodium chloride flush  3 mL Intravenous Q12H  . thiamine  100 mg Oral Daily   Continuous Infusions:    LOS: 5 days   Charlynne Cousins  [facility]  07/22/2019, 8:23 AM

## 2019-07-22 NOTE — Plan of Care (Signed)
Pt slept well during the night. No complaints of pain verbalized. Alert and oriented. Vitals stable on RA. Denies any respiratory distress. Independent with ADLs. IV SL.  Planned for discharge today. No other issues, will monitor.   Problem: Health Behavior/Discharge Planning: Goal: Ability to manage health-related needs will improve Outcome: Progressing   Problem: Education: Goal: Knowledge of risk factors and measures for prevention of condition will improve Outcome: Progressing

## 2019-07-23 DIAGNOSIS — J1289 Other viral pneumonia: Secondary | ICD-10-CM

## 2019-07-23 DIAGNOSIS — U071 COVID-19: Secondary | ICD-10-CM

## 2019-07-23 DIAGNOSIS — J1282 Pneumonia due to coronavirus disease 2019: Secondary | ICD-10-CM

## 2019-07-23 LAB — CK: Total CK: 1115 U/L — ABNORMAL HIGH (ref 49–397)

## 2019-07-23 LAB — GLUCOSE, CAPILLARY
Glucose-Capillary: 84 mg/dL (ref 70–99)
Glucose-Capillary: 126 mg/dL — ABNORMAL HIGH (ref 70–99)

## 2019-07-23 MED ORDER — HYDRALAZINE HCL 50 MG PO TABS: 50.0000 mg | ORAL_TABLET | Freq: Three times a day (TID) | ORAL | 3 refills | Status: DC

## 2019-07-23 MED ORDER — CLONIDINE HCL 0.1 MG PO TABS: 0.1000 mg | ORAL_TABLET | Freq: Two times a day (BID) | ORAL | 11 refills | Status: DC

## 2019-07-23 NOTE — Progress Notes (Signed)
Occupational Therapy Treatment Patient Details Name: Lawrence Barnett MRN: [id number] DOB: [birth year] Today's Date: 07/23/2019    History of present illness 68 y.o. male, past medical history of hypertension on HCTZ, possible alcohol abuse, patient was in good health until about 3 to 4 days ago when he started running low-grade fevers, mild diarrhea and a dry cough. When he was getting ready to go to to work he got lightheaded and fell.  He did not lose consciousness.  Presented to [facility] ER where he was diagnosed with COVID-19 pneumonia, sepsis, dehydration, potential and, AKI, elevated transaminases and he was admitted for further care.    OT comments  Educated on energy conservation, activity modification and strategies to reduce risk of falls. Anticipate DC home. No further OT needs.   Follow Up Recommendations  No OT follow up;Supervision - Intermittent    Equipment Recommendations  3 in 1 bedside commode    Recommendations for Other Services      Precautions / Restrictions Precautions Precautions: None Restrictions Weight Bearing Restrictions: No       Mobility Bed Mobility                  Transfers Overall transfer level: Modified independent                    Balance Overall balance assessment: Mild deficits observed, not formally tested                                         ADL either performed or assessed with clinical judgement   ADL                                         General ADL Comments: REviewed energy conservation strategies and activity modification. Recommend pt use 3in1 as shower seat. Pt verbalized understanding. discussed possibility of pt using a "Call Alert" syste given he lives alone and expressed fear of falling and no one finding him. Educated on strategies to reduce risk of falls     Vision       Perception     Praxis      Cognition Arousal/Alertness: Awake/alert Behavior  During Therapy: WFL for tasks assessed/performed Overall Cognitive Status: Within Functional Limits for tasks assessed                                          Exercises Other Exercises Other Exercises: independent with fultter valve and incentive spirometer   Shoulder Instructions       General Comments      Pertinent Vitals/ Pain       Pain Assessment: No/denies pain  Home Living                                          Prior Functioning/Environment              Frequency           Progress Toward Goals  OT Goals(current goals can now be found in the care plan section)  Progress towards OT goals: Goals met/education  completed, patient discharged from OT  Acute Rehab OT Goals Patient Stated Goal: to get better and go home OT Goal Formulation: With patient Time For Goal Achievement: 08/01/19 Potential to Achieve Goals: Good ADL Goals Pt Will Perform Lower Body Bathing: with modified independence;sit to/from stand Pt Will Perform Lower Body Dressing: with modified independence;sit to/from stand Pt Will Transfer to Toilet: with modified independence;ambulating Pt Will Perform Tub/Shower Transfer: with modified independence;ambulating;Tub transfer Pt/caregiver will Perform Home Exercise Program: Increased strength;Both right and left upper extremity;With written HEP provided;Independently;With theraband Additional ADL Goal #1: Pt will independently verbalie 3 energy conservation strategies for IADL/ADL tasks  Plan Discharge plan remains appropriate    Co-evaluation                 AM-PAC OT "6 Clicks" Daily Activity     Outcome Measure   Help from another person eating meals?: None Help from another person taking care of personal grooming?: None Help from another person toileting, which includes using toliet, bedpan, or urinal?: None Help from another person bathing (including washing, rinsing, drying)?: None Help  from another person to put on and taking off regular upper body clothing?: None Help from another person to put on and taking off regular lower body clothing?: None 6 Click Score: 24    End of Session Equipment Utilized During Treatment: Rolling walker  OT Visit Diagnosis: Unsteadiness on feet (R26.81);Muscle weakness (generalized) (M62.81)   Activity Tolerance Patient tolerated treatment well   Patient Left in chair;with call bell/phone within reach   Nurse Communication Mobility status;Other (comment)(need for 3in1)        Time: 1130-1148 OT Time Calculation (min): 18 min  Charges: OT General Charges $OT Visit: 1 Visit OT Treatments $Self Care/Home Management : 8-22 mins  Maurie Boettcher, OT/L   Acute OT Clinical Specialist [place] Pager [phone] Office [phone]    [facility] 07/23/2019, 1:54 PM

## 2019-07-23 NOTE — TOC Transition Note (Signed)
Transition of Care [facility]) - CM/SW Discharge Note   Patient Details  Name: Lawrence Barnett MRN: [id number] Date of Birth: 01/23/1951  [facility] CM/SW Contact:  Ninfa Meeker, RN Phone Number: 07/23/2019, 12:08 PM   Clinical Narrative:  Patient will discharge home today, CM notified that MD says no HH needed. Case manager did order 3in1 for home use. No further needs identified.     Final next level of care: Home/Self Care Barriers to Discharge: No Barriers Identified   Patient Goals and CMS Choice Patient states their goals for this hospitalization and ongoing recovery are:: get better CMS Medicare.gov Compare Post Acute Care list provided to:: Patient(verbally offered choice)    Discharge Placement                       Discharge Plan and Services   Discharge Planning Services: CM Consult            DME Arranged: 3-N-1 DME Agency: [place] Date DME Agency Contacted: 07/23/19 Time DME Agency Contacted: 1208   [place]: PT, OT [place] Agency: [place] ([place]) Date [place]: 07/22/19 Time [place]: 1232 Representative spoke with at [place]: [place] Determinants of Health ([place]) Interventions     Readmission Risk Interventions No flowsheet data found.

## 2019-07-23 NOTE — Discharge Summary (Signed)
Physician Discharge Summary  ANDREI MCCOOK RUE:[id number] DOB: December 27, 1950 DOA: 07/17/2019  PCP: Garnet Koyanagi, FNP  Admit date: 07/17/2019 Discharge date: 07/23/2019  Admitted From: home Disposition:  Home  Recommendations for Outpatient Follow-up:  1. Follow up with PCP in 1-2 weeks 2. Please obtain a UA to see if his hematuria and proteinuria have improved, BMP/CBC in one week   Home Health:No Equipment/Devices:None  Discharge Condition:Stable CODE STATUS:Full Diet recommendation: Heart Healthy   Brief/Interim Summary: 68 y.o. male past medical history of essential hypertension on hydrochlorothiazide, possible alcohol abuse was in good health until 3 to 4 days ago when he started running a low-grade fever, diarrhea and a dry cough.  On the day of admission he got up and felt lightheaded did not lose consciousness went to [facility] ER was diagnosed with COVID-19, sepsis dehydration and acute renal failure and was transferred to [facility].  Discharge Diagnoses:  Active Problems:   Severe sepsis (HCC)   Transaminitis   Alcohol abuse   Hypokalemia   COVID-19 virus infection   Acute respiratory failure with hypoxia (HCC)   Proteinuria   Hematuria   Pneumonia due to COVID-19 virus  Severe sepsis due to COVID-19 viral pneumonia: He was fluid resuscitated and his blood pressure improved along with endorgan damage. His blood pressure started to improve so he was started on oral antihypertensive medication which you continue to take at home.  Acute respiratory failure with hypoxia due to COVID-19 virus infection: Initially he was placed on oxygen to keep the saturation greater 95% he was started empirically on IV remdesivir and steroids. He was weaned off oxygen to room air and he was doing fine he completed his course in-house.  Transaminitis: Multifactorial in the setting of COVID-19, rhabdomyolysis and hypotension. Right upper quadrant ultrasound showed no  stones but it did show fatty liver. His liver enzymes started slowly to improve with conservative management.  Proteinuria and hematuria: We will need to follow-up with his PCP as an outpatient check a UA.  Alcohol abuse: He was counseled monitor with protocols no signs of DTs he was started on admission on thiamine and folate we were stopped at the time of discharge.  Dehydration: Resolved with IV fluid hydration.  Essential hypertension: He was started on clonidine and hydralazine which she will continue as an outpatient.  Rhabdomyolysis: Nontraumatic acute due to COVID-19 did resolve with IV fluid hydration.  Fatty liver: Follow-up with the PCP as an outpatient.  Discharge Instructions  Discharge Instructions    Diet - low sodium heart healthy   Complete by: As directed    Increase activity slowly   Complete by: As directed      Allergies as of 07/23/2019      Reactions   Penicillins Shortness Of Breath   Patient reports reaction occurred as a teenager and he experienced shortness of breath not requiring hospitalization.    Amlodipine Swelling   Lisinopril Swelling   Metoprolol Swelling   Tylenol [acetaminophen]       Medication List    STOP taking these medications   methylPREDNISolone sodium succinate 40 mg/mL injection Commonly known as: SOLU-MEDROL     TAKE these medications   cloNIDine 0.1 MG tablet Commonly known as: CATAPRES Take 1 tablet (0.1 mg total) by mouth 2 (two) times daily.   hydrALAZINE 50 MG tablet Commonly known as: APRESOLINE Take 1 tablet (50 mg total) by mouth every 8 (eight) hours.      Follow-up Information  [place] Follow up.   Why: A representative from [facility] will contact you to arrange start dare and time for therapy. Contact information: [address] [place] [address] [phone]          Allergies  Allergen Reactions  . Penicillins Shortness Of Breath     Patient reports reaction occurred as a teenager and he experienced shortness of breath not requiring hospitalization.   . Amlodipine Swelling  . Lisinopril Swelling  . Metoprolol Swelling  . Tylenol [Acetaminophen]     Consultations:  None   Procedures/Studies: Ct Head Wo Contrast  Result Date: 07/16/2019 CLINICAL DATA:  Head injury after fall today. Uncertain loss of consciousness. EXAM: CT HEAD WITHOUT CONTRAST TECHNIQUE: Contiguous axial images were obtained from the base of the skull through the vertex without intravenous contrast. COMPARISON:  None. FINDINGS: Brain: Mild chronic ischemic white matter disease is noted. No mass effect or midline shift is noted. Ventricular size is within normal limits. There is no evidence of mass lesion, hemorrhage or acute infarction. Vascular: No hyperdense vessel or unexpected calcification. Skull: Normal. Negative for fracture or focal lesion. Sinuses/Orbits: Bilateral maxillary and left frontal sinusitis is noted. Other: None. IMPRESSION: Mild chronic ischemic white matter disease. No acute intracranial abnormality seen. Bilateral maxillary and left frontal sinusitis. Electronically Signed   By: Lupita Raider M.D.   On: 07/16/2019 14:13   US Abdomen Complete  Result Date: 07/17/2019 CLINICAL DATA:  Transaminitis. EXAM: ABDOMEN ULTRASOUND COMPLETE COMPARISON:  None. FINDINGS: Gallbladder: The gallbladder is partially contracted. No gallstones or wall thickening visualized. No sonographic Murphy sign noted by sonographer. Common bile duct: Diameter: 0.3 cm Liver: No focal lesion is identified. The liver is dense with increased echogenicity. Portal vein is patent on color Doppler imaging with normal direction of blood flow towards the liver. IVC: No abnormality visualized. Pancreas: Not visualized due to overlying bowel gas. Spleen: Size and appearance within normal limits. Right Kidney: Length: 10.3 cm. Echogenicity within normal limits. No solid mass  or hydronephrosis visualized. Simple 2.5 cm cyst noted. Left Kidney: Length: . 12.3 cm. Echogenicity within normal limits. No solid mass or hydronephrosis visualized. Two contiguous cysts versus a cyst with a single thin septation measuring 5.3 cm noted. Abdominal aorta: No aneurysm visualized. Other findings: None. IMPRESSION: No acute abnormality. Diffuse fatty infiltration of the liver. Electronically Signed   By: Drusilla Kanner M.D.   On: 07/17/2019 11:48   Dg Chest Port 1 View  Result Date: 07/16/2019 CLINICAL DATA:  from home due to the family calling when they were unable to get the pt to the door in order to open it and let them in. EMS states that the fire department broke down the door and found the pt in the bathtub. Pt states that he was getting ready for work when he started feeling weak and fell in the tub and hit his head. Pt does not think he lost consciousness. Hx of hypertension. Former smoker EXAM: PORTABLE CHEST 1 VIEW COMPARISON:  None. FINDINGS: Cardiac silhouette normal in size.  No mediastinal or hilar masses. There is opacity in the right lobe, some milder opacity at the left lung base and minimal area of opacity in the left mid lung. This may all be due to scarring. Consider multifocal infection if there are consistent clinical findings. Remainder of the lungs is clear. No pleural effusion or pneumothorax. Skeletal structures are grossly intact. IMPRESSION: 1. Areas of lung opacity as  detailed above may reflect chronic scarring. Consider multifocal pneumonia if there are consistent clinical findings. Otherwise, no evidence of acute cardiopulmonary disease. Electronically Signed   By: Amie Portlandavid  Ormond M.D.   On: 07/16/2019 13:59   (Echo, Carotid, EGD, Colonoscopy, ERCP)    Subjective: No complaints feels great.  Discharge Exam: Vitals:   07/22/19 2042 07/23/19 0526  BP: 112/68 125/82  Pulse: 94 87  Resp: 17 16  Temp: 98.3 F (36.8 C) 98.9 F (37.2 C)  SpO2: 97% 100%    Vitals:   07/22/19 0727 07/22/19 1655 07/22/19 2042 07/23/19 0526  BP: 102/71 (!) 121/91 112/68 125/82  Pulse: 98 99 94 87  Resp: 16 16 17 16   Temp: 98.7 F (37.1 C) 97.7 F (36.5 C) 98.3 F (36.8 C) 98.9 F (37.2 C)  TempSrc: Oral Oral Oral Oral  SpO2: 97% 98% 97% 100%  Weight:      Height:        General: Pt is alert, awake, not in acute distress Cardiovascular: RRR, S1/S2 +, no rubs, no gallops Respiratory: CTA bilaterally, no wheezing, no rhonchi Abdominal: Soft, NT, ND, bowel sounds + Extremities: no edema, no cyanosis    The results of significant diagnostics from this hospitalization (including imaging, microbiology, ancillary and laboratory) are listed below for reference.     Microbiology: Recent Results (from the past 240 hour(s))  Culture, blood (Routine x 2)     Status: None   Collection Time: 07/16/19  1:50 PM   Specimen: BLOOD  Result Value Ref Range Status   Specimen Description BLOOD RIGHT ANTECUBITAL  Final   Special Requests   Final    BOTTLES DRAWN AEROBIC AND ANAEROBIC Blood Culture adequate volume   Culture   Final    NO GROWTH 5 DAYS Performed at [facility], [address] [place], [place] [address]    Report Status 07/21/2019 FINAL  Final  Culture, blood (Routine x 2)     Status: None   Collection Time: 07/16/19  2:58 PM   Specimen: BLOOD  Result Value Ref Range Status   Specimen Description BLOOD BLOOD RIGHT HAND  Final   Special Requests   Final    BOTTLES DRAWN AEROBIC AND ANAEROBIC Blood Culture results may not be optimal due to an inadequate volume of blood received in culture bottles   Culture   Final    NO GROWTH 5 DAYS Performed at [facility], [address] [place], [place] [address]    Report Status 07/21/2019 FINAL  Final  SARS CORONAVIRUS 2 (TAT 6-24 HRS) Nasopharyngeal Nasopharyngeal Swab     Status: Abnormal   Collection Time: 07/16/19  3:12 PM   Specimen: Nasopharyngeal Swab  Result Value Ref  Range Status   SARS Coronavirus 2 POSITIVE (A) NEGATIVE Final    Comment: RESULT CALLED TO, READ BACK BY AND VERIFIED WITH: A.MCCULLEY EN @ 1210 07/17/2019 BY SANDY (NOTE) SARS-CoV-2 target nucleic acids are DETECTED. The SARS-CoV-2 RNA is generally detectable in upper and lower respiratory specimens during the acute phase of infection. Positive results are indicative of active infection with SARS-CoV-2. Clinical  correlation with patient history and other diagnostic information is necessary to determine patient infection status. Positive results do  not rule out bacterial infection or co-infection with other viruses. The expected result is Negative. Fact Sheet for Patients: HairSlick.nohttps://www.fda.gov/media/138098/download Fact Sheet for Healthcare Providers: quierodirigir.comhttps://www.fda.gov/media/138095/download This test is not yet approved or cleared by the [place] FDA and  has been authorized for detection  and/or diagnosis of SARS-CoV-2 by FDA under an Emergency Use Authorization (EUA). This EUA will remain  in effect (meaning this test can be used)  for the duration of the COVID-19 declaration under Section 564(b)(1) of the Act, 21 U.S.C. section 360bbb-3(b)(1), unless the authorization is terminated or revoked sooner. Performed at [facility], [address], [place], [place] [address]   Urine culture     Status: Abnormal   Collection Time: 07/16/19  3:12 PM   Specimen: Urine, Clean Catch  Result Value Ref Range Status   Specimen Description   Final    URINE, CLEAN CATCH Performed at [facility], [address], [place], [place] [address]    Special Requests   Final    NONE Performed at [facility], [address] [place], [place] [address]    Culture (A)  Final    10,000 COLONIES/mL DIPHTHEROIDS(CORYNEBACTERIUM SPECIES) Standardized susceptibility testing for this organism is not available. Performed at [facility], [address],  [place], [place] [address]    Report Status 07/18/2019 FINAL  Final  MRSA PCR Screening     Status: None   Collection Time: 07/17/19  1:30 PM   Specimen: Nasal Mucosa; Nasopharyngeal  Result Value Ref Range Status   MRSA by PCR NEGATIVE NEGATIVE Final    Comment:        The GeneXpert MRSA Assay (FDA approved for NASAL specimens only), is one component of a comprehensive MRSA colonization surveillance program. It is not intended to diagnose MRSA infection nor to guide or monitor treatment for MRSA infections. Performed at [facility], [address] [place], [place] [address]      Labs: BNP (last 3 results) Recent Labs    07/19/19 0335 07/20/19 0440 07/21/19 0510  BNP 48.1 186.5* 111.1*   Basic Metabolic Panel: Recent Labs  Lab 07/17/19 1201 07/18/19 0125 07/19/19 0335 07/20/19 0440 07/21/19 0510  NA 147* 147* 146* 144 142  K 3.1* 4.3 4.8 4.4 3.6  CL 118* 117* 114* 110 111  CO2 16* 20* 21* 24 22  GLUCOSE 112* 240* 222* 182* 143*  BUN 27* 30* 35* 34* 28*  CREATININE 1.36* 1.40* 1.29* 1.32* 1.05  CALCIUM 8.7* 8.8* 9.4 9.0 8.7*  MG  --  2.2 2.1 2.1 1.9   Liver Function Tests: Recent Labs  Lab 07/17/19 1201 07/18/19 0125 07/19/19 0335 07/20/19 0440 07/21/19 0510  AST 540* 425* 256* 174* 130*  ALT 171* 157* 141* 124* 117*  ALKPHOS 37* 35* 39 44 46  BILITOT 1.0 1.2 0.7 1.2 1.3*  PROT 7.9 7.6 7.3 6.8 6.6  ALBUMIN 3.4* 3.2* 3.0* 3.0* 2.8*   No results for input(s): LIPASE, AMYLASE in the last 168 hours. Recent Labs  Lab 07/16/19 1458  AMMONIA 25   CBC: Recent Labs  Lab 07/16/19 1349  07/17/19 1813 07/18/19 0125 07/19/19 0335 07/20/19 0440 07/21/19 0510  WBC 10.4   < > 4.4 3.4* 6.6 7.0 9.0  NEUTROABS 9.4*  --   --  2.1 4.6 4.9 6.2  HGB 15.8   < > 15.3 15.0 14.6 14.0 13.7  HCT 45.6   < > 46.4 46.3 45.5 43.0 40.8  MCV 84.3   < > 89.1 89.2 90.1 89.0 87.2  PLT 198   < > 180 171 193 185 202   < > = values in this interval not displayed.    Cardiac Enzymes: Recent Labs  Lab 07/18/19 0125 07/19/19 0335 07/20/19 0440 07/21/19  0510 07/23/19 0153  CKTOTAL 1,375* 11,367* 6,334* 3,567* 1,115*   BNP: Invalid input(s): POCBNP CBG: Recent Labs  Lab 07/22/19 0812 07/22/19 1301 07/22/19 1557 07/22/19 2012 07/23/19 0735  GLUCAP 111* 181* 206* 188* 84   D-Dimer Recent Labs    07/21/19 0510  DDIMER 3.22*   Hgb A1c No results for input(s): HGBA1C in the last 72 hours. Lipid Profile No results for input(s): CHOL, HDL, LDLCALC, TRIG, CHOLHDL, LDLDIRECT in the last 72 hours. Thyroid function studies No results for input(s): TSH, T4TOTAL, T3FREE, THYROIDAB in the last 72 hours.  Invalid input(s): FREET3 Anemia work up No results for input(s): VITAMINB12, FOLATE, FERRITIN, TIBC, IRON, RETICCTPCT in the last 72 hours. Urinalysis    Component Value Date/Time   COLORURINE YELLOW (A) 07/16/2019 1512   APPEARANCEUR CLEAR (A) 07/16/2019 1512   LABSPEC 1.011 07/16/2019 1512   PHURINE 6.0 07/16/2019 1512   GLUCOSEU NEGATIVE 07/16/2019 1512   HGBUR LARGE (A) 07/16/2019 1512   BILIRUBINUR NEGATIVE 07/16/2019 1512   KETONESUR NEGATIVE 07/16/2019 1512   PROTEINUR 100 (A) 07/16/2019 1512   NITRITE NEGATIVE 07/16/2019 1512   LEUKOCYTESUR NEGATIVE 07/16/2019 1512   Sepsis Labs Invalid input(s): PROCALCITONIN,  WBC,  LACTICIDVEN Microbiology Recent Results (from the past 240 hour(s))  Culture, blood (Routine x 2)     Status: None   Collection Time: 07/16/19  1:50 PM   Specimen: BLOOD  Result Value Ref Range Status   Specimen Description BLOOD RIGHT ANTECUBITAL  Final   Special Requests   Final    BOTTLES DRAWN AEROBIC AND ANAEROBIC Blood Culture adequate volume   Culture   Final    NO GROWTH 5 DAYS Performed at [facility], [address] [place], [place] [address]    Report Status 07/21/2019 FINAL  Final  Culture, blood (Routine x 2)     Status: None   Collection Time: 07/16/19  2:58 PM   Specimen:  BLOOD  Result Value Ref Range Status   Specimen Description BLOOD BLOOD RIGHT HAND  Final   Special Requests   Final    BOTTLES DRAWN AEROBIC AND ANAEROBIC Blood Culture results may not be optimal due to an inadequate volume of blood received in culture bottles   Culture   Final    NO GROWTH 5 DAYS Performed at [facility], [address] [place], [place] [address]    Report Status 07/21/2019 FINAL  Final  SARS CORONAVIRUS 2 (TAT 6-24 HRS) Nasopharyngeal Nasopharyngeal Swab     Status: Abnormal   Collection Time: 07/16/19  3:12 PM   Specimen: Nasopharyngeal Swab  Result Value Ref Range Status   SARS Coronavirus 2 POSITIVE (A) NEGATIVE Final    Comment: RESULT CALLED TO, READ BACK BY AND VERIFIED WITH: A.MCCULLEY EN @ 1210 07/17/2019 BY SANDY (NOTE) SARS-CoV-2 target nucleic acids are DETECTED. The SARS-CoV-2 RNA is generally detectable in upper and lower respiratory specimens during the acute phase of infection. Positive results are indicative of active infection with SARS-CoV-2. Clinical  correlation with patient history and other diagnostic information is necessary to determine patient infection status. Positive results do  not rule out bacterial infection or co-infection with other viruses. The expected result is Negative. Fact Sheet for Patients: HairSlick.no Fact Sheet for Healthcare Providers: quierodirigir.com This test is not yet approved or cleared by the [place] FDA and  has been authorized for detection and/or diagnosis of SARS-CoV-2 by FDA under an Emergency Use Authorization (EUA). This EUA will remain  in effect (  meaning this test can be used)  for the duration of the COVID-19 declaration under Section 564(b)(1) of the Act, 21 U.S.C. section 360bbb-3(b)(1), unless the authorization is terminated or revoked sooner. Performed at [facility], [place] [address] [place], [place] [address]    Urine culture     Status: Abnormal   Collection Time: 07/16/19  3:12 PM   Specimen: Urine, Clean Catch  Result Value Ref Range Status   Specimen Description   Final    URINE, CLEAN CATCH Performed at [facility], [address], [place], [place] [address]    Special Requests   Final    NONE Performed at [facility], [address] [place], [place] [address]    Culture (A)  Final    10,000 COLONIES/mL DIPHTHEROIDS(CORYNEBACTERIUM SPECIES) Standardized susceptibility testing for this organism is not available. Performed at [facility], [place] [address], [place], [place] [address]    Report Status 07/18/2019 FINAL  Final  MRSA PCR Screening     Status: None   Collection Time: 07/17/19  1:30 PM   Specimen: Nasal Mucosa; Nasopharyngeal  Result Value Ref Range Status   MRSA by PCR NEGATIVE NEGATIVE Final    Comment:        The GeneXpert MRSA Assay (FDA approved for NASAL specimens only), is one component of a comprehensive MRSA colonization surveillance program. It is not intended to diagnose MRSA infection nor to guide or monitor treatment for MRSA infections. Performed at [facility], [address] [place], [place] [address]      Time coordinating discharge: Over 40 minutes  SIGNED:   Charlynne Cousins, MD  [facility] 07/23/2019, 9:33 AM Pager   If 7PM-7AM, please contact night-coverage [email or web address] Password [email or web address]

## 2019-07-23 NOTE — Progress Notes (Signed)
OT informed RN that patient will need 3-in-1 commode for home use.  Ricki Miller, RN , case manager made aware and states she will take care of this.

## 2021-06-04 IMAGING — DX DG CHEST 1V PORT
1 series · 1 of 1 positions shown · non-contrast
Comparison: None.

CLINICAL DATA: from home due to the family calling when they were
unable to get the pt to the door in order to open it and let them
in. EMS states that the fire department broke down the door and
found the pt in the bathtub. Pt states that he was getting ready for
work when he started feeling weak and fell in the tub and hit his
head. Pt does not think he lost consciousness. Hx of hypertension.
Former smoker

EXAM:
PORTABLE CHEST 1 VIEW

[chest ap]
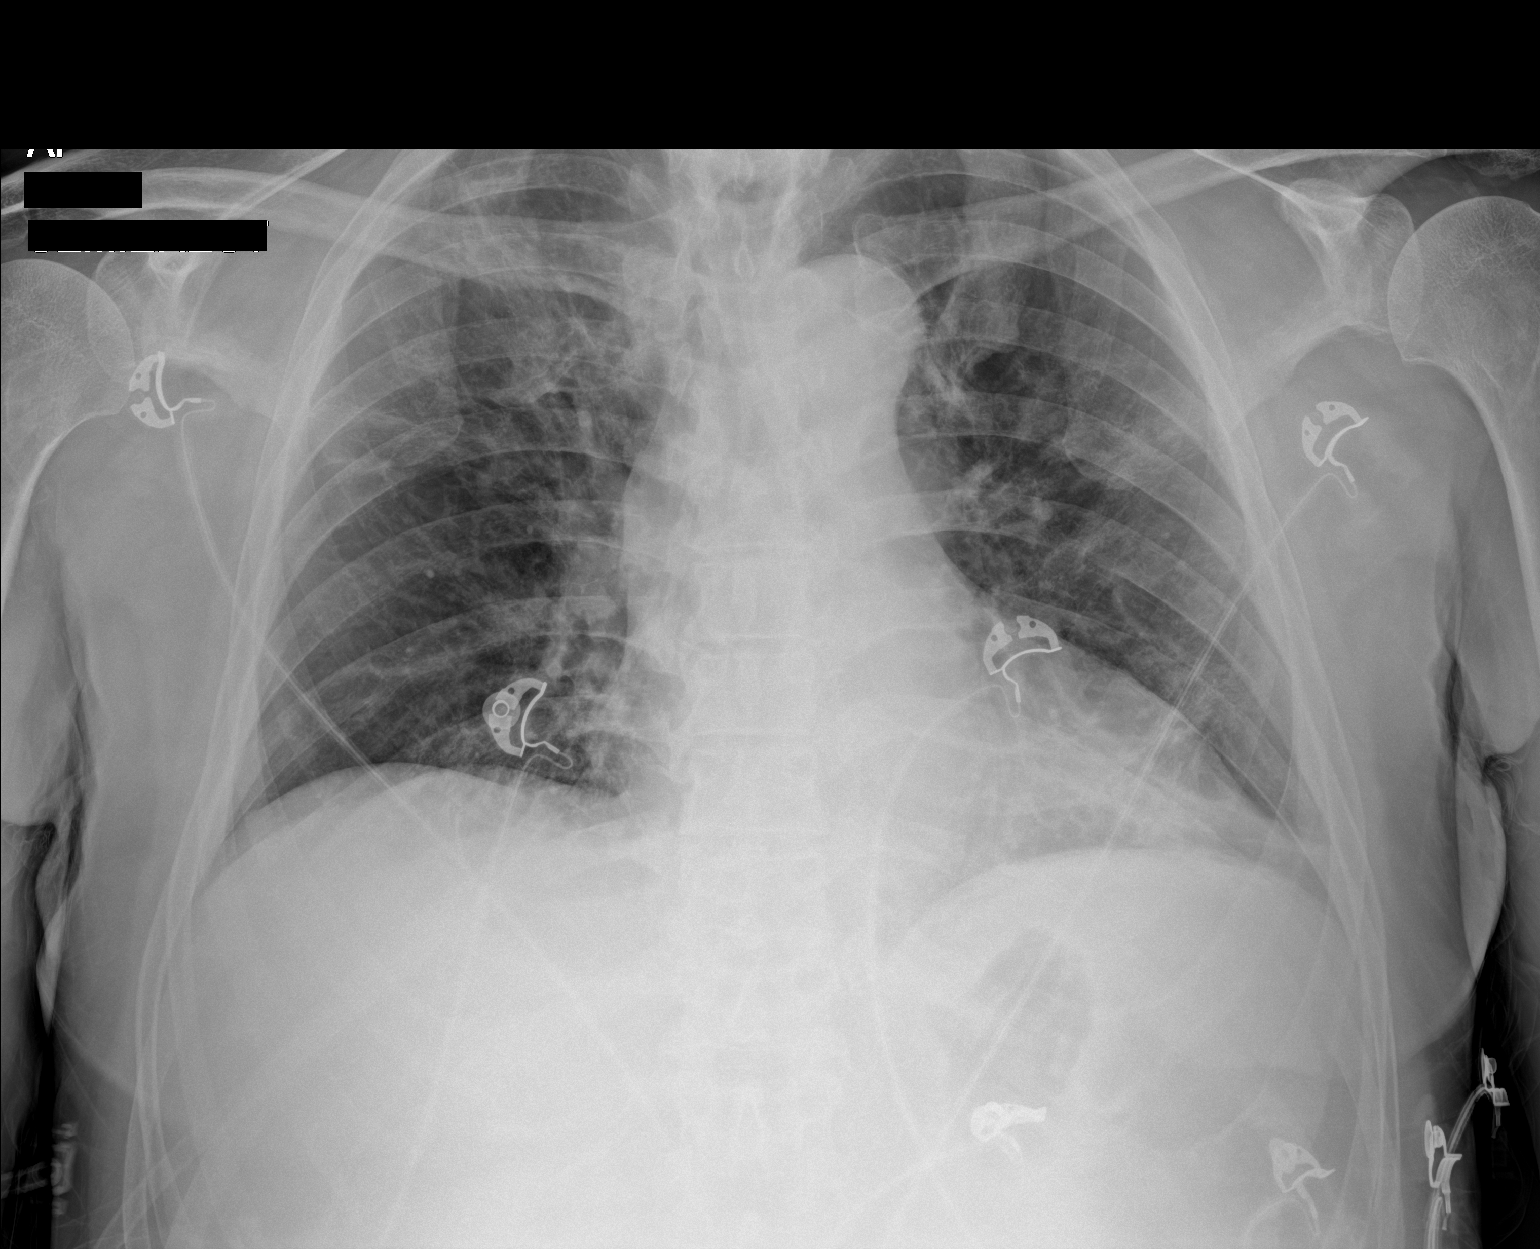

[1 of 1 positions shown; findings below may reference images not displayed]

FINDINGS: Cardiac silhouette normal in size.  No mediastinal or hilar masses.

There is opacity in the right lobe, some milder opacity at the left
lung base and minimal area of opacity in the left mid lung. This may
all be due to scarring. Consider multifocal infection if there are
consistent clinical findings. Remainder of the lungs is clear. No
pleural effusion or pneumothorax.

Skeletal structures are grossly intact.
IMPRESSION: 1. Areas of lung opacity as detailed above may reflect chronic
scarring. Consider multifocal pneumonia if there are consistent
clinical findings. Otherwise, no evidence of acute cardiopulmonary
disease.

## 2021-06-05 IMAGING — US US ABDOMEN COMPLETE
1 series · 14 of 25 positions shown · non-contrast
Comparison: None.

CLINICAL DATA: Transaminitis.

EXAM:
ABDOMEN ULTRASOUND COMPLETE

[Series 1: us abdomen complete · 0.20mm/px · 14 of 93 slices shown]
[im 1/93]
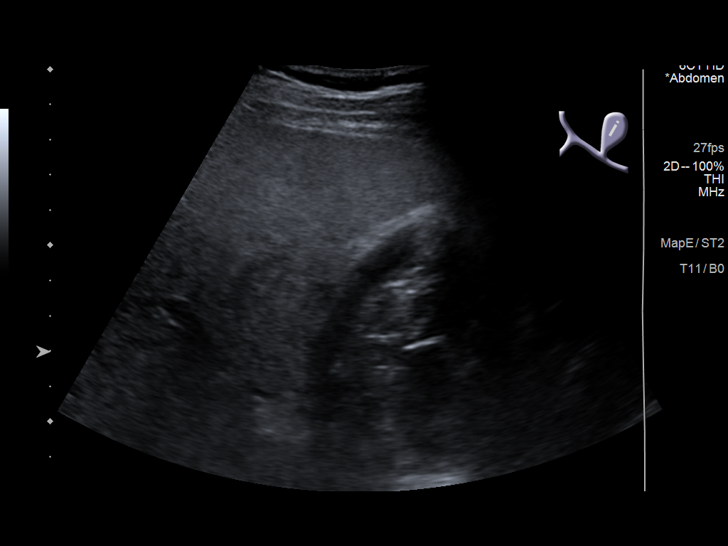
[im 8/93]
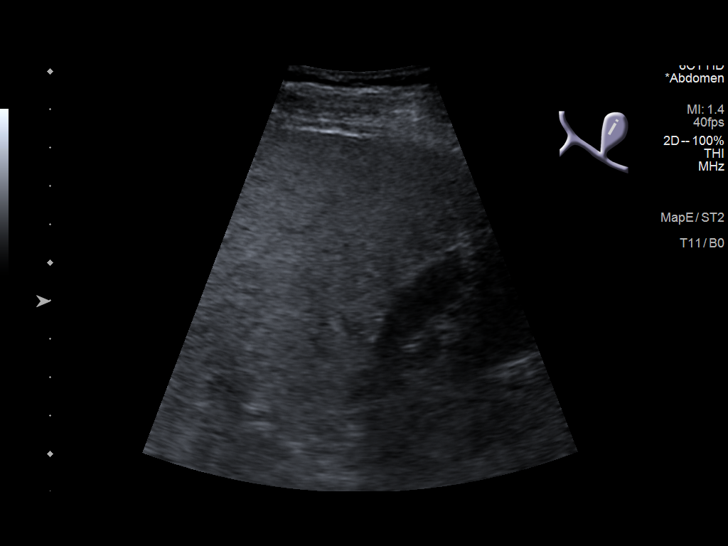
[im 16/93]
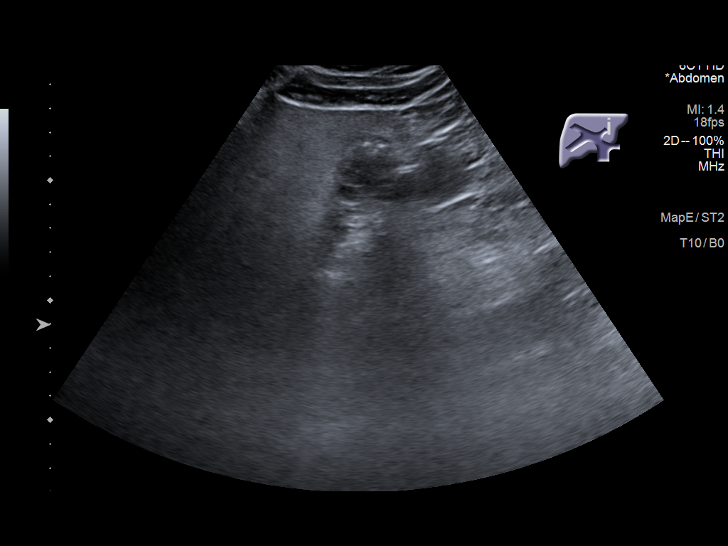
[im 24/93]
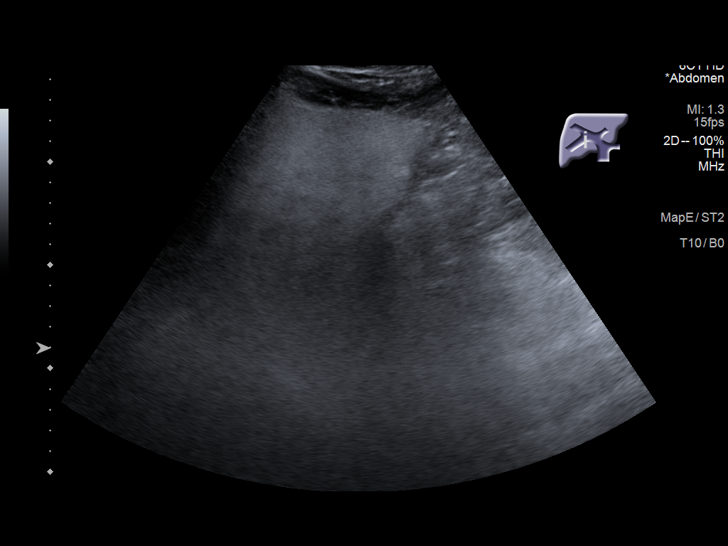
[im 31/93]
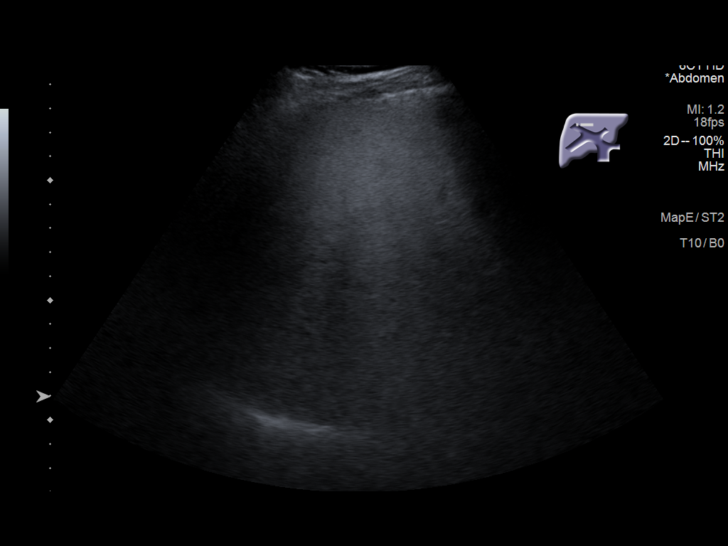
[im 35/93]
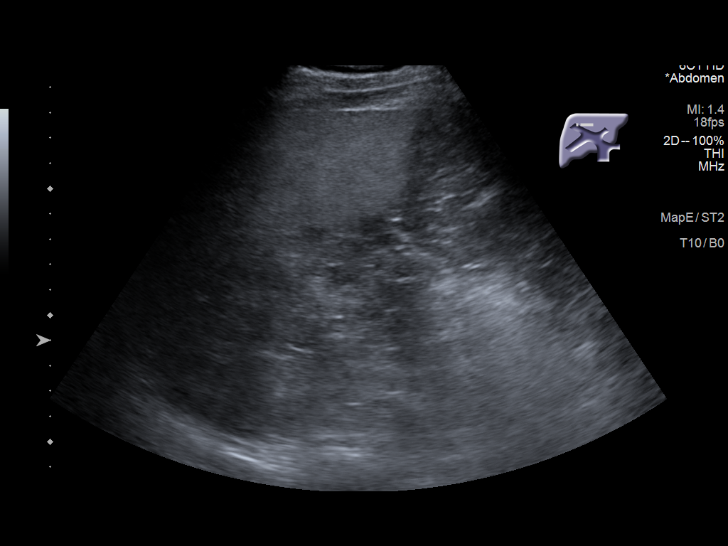
[im 43/93]
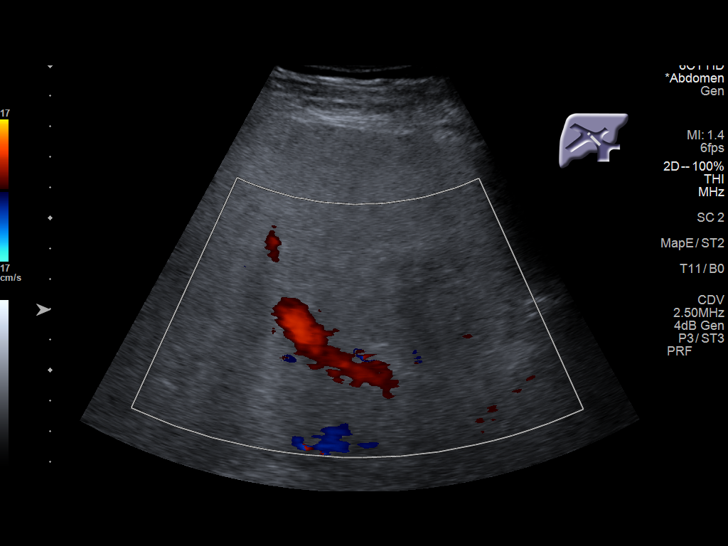
[im 50/93]
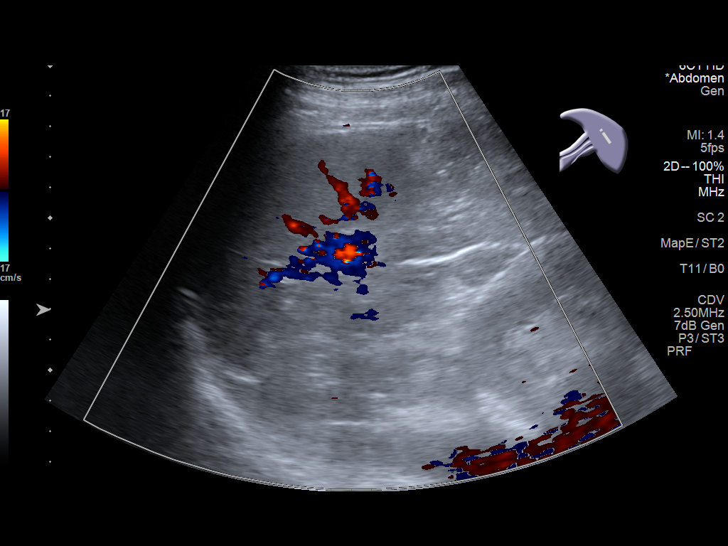
[im 58/93]
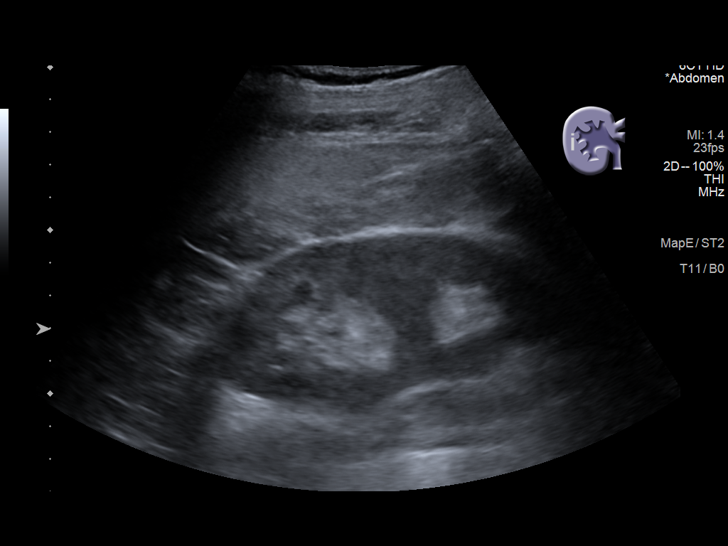
[im 62/93]
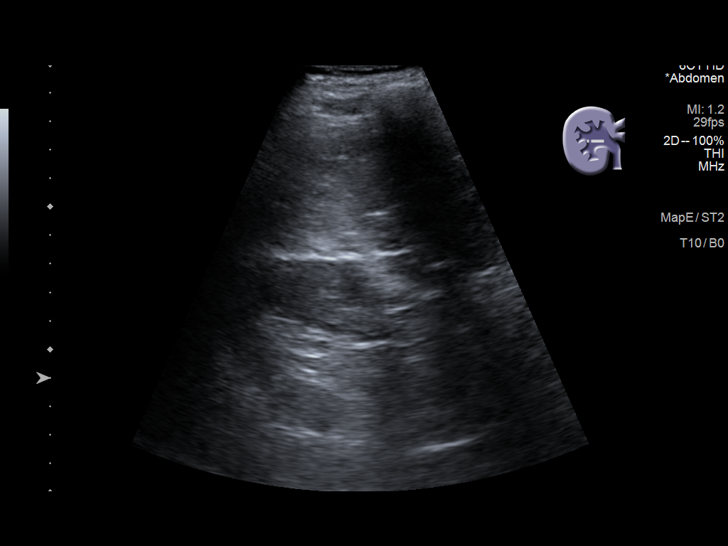
[im 70/93]
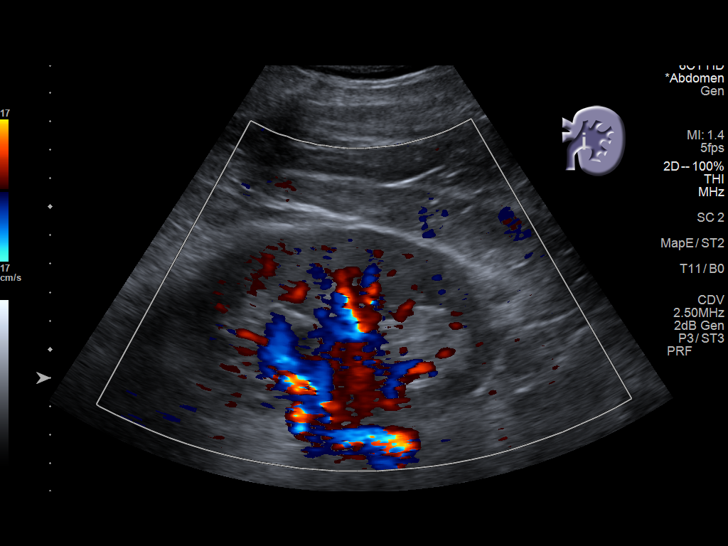
[im 77/93]
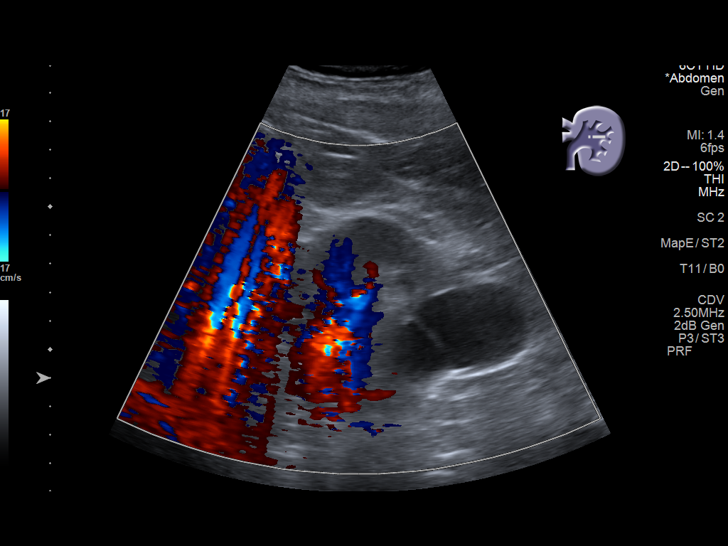
[im 85/93]
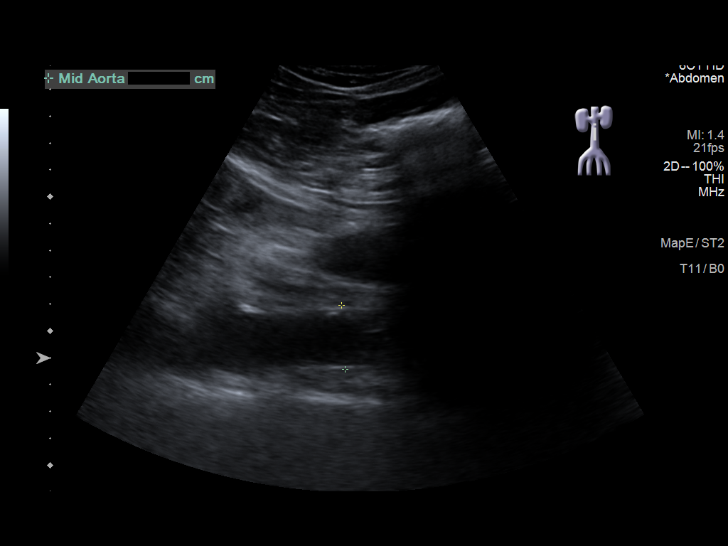
[im 93/93]
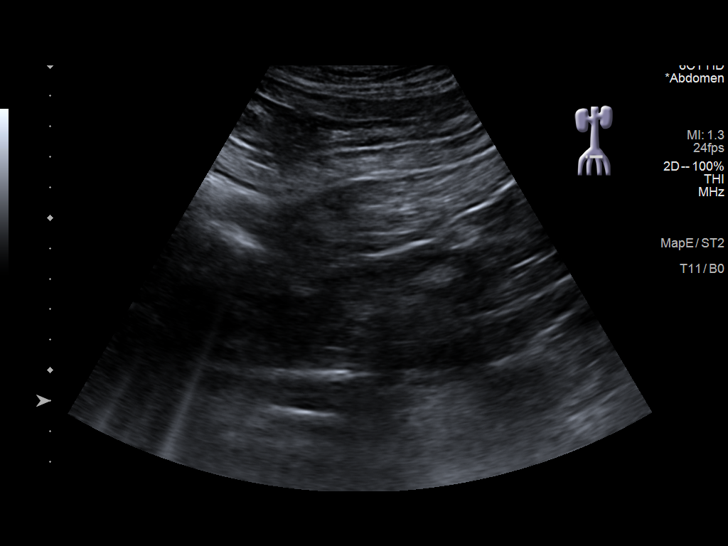

[14 of 25 positions shown; findings below may reference images not displayed]

FINDINGS: Gallbladder: The gallbladder is partially contracted. No gallstones
or wall thickening visualized. No sonographic Murphy sign noted by
sonographer.

Common bile duct: Diameter: 0.3 cm

Liver: No focal lesion is identified. The liver is dense with
increased echogenicity. Portal vein is patent on color Doppler
imaging with normal direction of blood flow towards the liver.

IVC: No abnormality visualized.

Pancreas: Not visualized due to overlying bowel gas.

Spleen: Size and appearance within normal limits.

Right Kidney: Length: 10.3 cm. Echogenicity within normal limits. No
solid mass or hydronephrosis visualized. Simple 2.5 cm cyst noted.

Left Kidney: Length: . 12.3 cm. Echogenicity within normal limits.
No solid mass or hydronephrosis visualized. Two contiguous cysts
versus a cyst with a single thin septation measuring 5.3 cm noted.

Abdominal aorta: No aneurysm visualized.

Other findings: None.
IMPRESSION: No acute abnormality.

Diffuse fatty infiltration of the liver.

## 2022-01-18 NOTE — Progress Notes (Signed)
 Chief Complaint:   Chief Complaint  Patient presents with  . Hypertension    Has not taken BP in 6 months // works out 45 mins in morning before work.     Subjective:   Lawrence Barnett is a 71 y.o. male established patient in today for: HPI: Patient presents to clinic today for hypertension.  He has been taking his blood pressure meds daily regularly.  Reports leg swelling when exposed to certain grasses and this happened last week at [employer]-similar to episodes in the past when he was younger. Patient exercises daily for at least 45 min with weightlifting.  Drinks wine daily 2 glasses.   Review of systems: No new headaches, vision changes, shortness of breath, chest pain, abdominal pain, nausea, vomiting.   Patient Active Problem List  Diagnosis  . Hypertension  . Asymptomatic proteinuria  . 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 Allergy to influenza vaccine  . ATN (acute tubular necrosis) (CMS-HCC)  . Hx of hematuria  . Hx of hypokalemia  . Transaminitis  . History of pneumonia  . Alcohol abuse, daily use    Outpatient Medications Prior to Visit  Medication Sig Dispense Refill  . famotidine  (PEPCID ) 40 MG tablet Take by mouth    . hydroCHLOROthiazide (HYDRODIURIL) 25 MG tablet Take 1 tablet (25 mg total) by mouth 2 (two) times daily 180 tablet 3  . triamcinolone (NASACORT AQ) 55 mcg nasal spray Place 2 sprays into both nostrils once daily    . naproxen sodium (ALEVE) 220 MG tablet Take 1 tablet (220 mg total) by mouth 2 (two) times daily with meals (Patient not taking: Reported on 01/18/2022)     No facility-administered medications prior to visit.    ROS see HPI  Objective:   Vitals:   01/18/22 1533  BP: (!) 160/94  Pulse: 101  Weight: 93.9 kg (207 lb)  Height: 177.8 cm (5' 10)  PainSc: 0-No pain   Body mass index is 29.7 kg/m. Home Vitals:    General: Well-appearing, well-nourished, in no apparent distress Pulmonary: Clear to auscultation bilaterally, no rales,  rhonchi or wheezes Cardio: Regular rate and rhythm, S1, S2, no murmurs, rubs or gallops Abdomen: Soft, non-tender, nondistended, normoactive bowel sounds present Extremities: No clubbing, cyanosis; 2+ edema, dorsalis pedis pulses 2+ bilaterally Psych: Normal mood and affect for the encounter   Assessment/Plan:   Diagnoses and all orders for this visit:  Essential hypertension -     Lipid Panel W/Reflex Direct Low Density Lipoprotein (LDL) Cholesterol -     Comprehensive Metabolic Panel (CMP) -     Complete Blood Count (CBC) with Differential  Screening for prostate cancer -     Prostate Specific Antigen (PSA), Screen  Screening for diabetes mellitus  Overweight   1.  Discussed with patient importance of normalizing blood pressure control.  Patient reports home blood pressures are close to normal.  Decrease wine consumption to no more than two glasses per day as this can increase blood pressure-discussed this with patient.  Therefore, check home blood pressures daily at least one hour after taking home blood pressure medications.  Send home blood pressure readings to me via MyChart message in approximately one week. 2.  Screening labs as ordered. 3.  Leg swelling bilaterally occurs after exposure to certain grasses per patient-it has been improving over the last week.  Will continue to monitor. Discussed with patient allergy medications/referral to Allergist but he does not want to start that/do this at this time.  4.  Patient declines colorectal cancer and lung cancer screening today.    Return to clinic for reevaluation or proceed to the ER for any new, worsening or concerning symptoms and as needed.   F/u with me in 4 months.  No follow-ups on file.  Future Appointments    Date/Time Provider Department Center Visit Type   07/23/2022 8:00 AM (Arrive by 7:45 AM) [facility]       There are no Patient  Instructions on file for this visit.  MESHIA EVERRETT LASH, MD

## 2022-03-22 ENCOUNTER — Ambulatory Visit: Payer: Self-pay | Admitting: Urology

## 2023-04-23 NOTE — Progress Notes (Signed)
 [facility] [employer] was  able to reach the patient by phone call.  The details of interventions are as follows:  Preventive Care-     04/23/2023    2:01 PM  Gap Closure  Age Group: Adult  Adult Yearly Physical Interventions Patient declined scheduling - Noted in Specialty Comments  Comments: Okayed removal from call list  Adult Yearly Physical Declination Other (Comments)  Comments: states [facility] are no longer with [facility], hasnt been sen in two years (was seen 2023)    Lawrence Barnett  For more information on [facility] services, click here.

## 2024-07-14 ENCOUNTER — Emergency Department (HOSPITAL_COMMUNITY)

## 2024-07-14 ENCOUNTER — Inpatient Hospital Stay (HOSPITAL_COMMUNITY)
Admission: EM | Admit: 2024-07-14 | Discharge: 2024-09-03 | DRG: 064 | Disposition: E | Attending: Internal Medicine | Admitting: Internal Medicine

## 2024-07-14 DIAGNOSIS — E0591 Thyrotoxicosis, unspecified with thyrotoxic crisis or storm: Secondary | ICD-10-CM | POA: Diagnosis not present

## 2024-07-14 DIAGNOSIS — B9561 Methicillin susceptible Staphylococcus aureus infection as the cause of diseases classified elsewhere: Secondary | ICD-10-CM | POA: Diagnosis present

## 2024-07-14 DIAGNOSIS — G9341 Metabolic encephalopathy: Secondary | ICD-10-CM | POA: Diagnosis not present

## 2024-07-14 DIAGNOSIS — I82612 Acute embolism and thrombosis of superficial veins of left upper extremity: Secondary | ICD-10-CM | POA: Diagnosis present

## 2024-07-14 DIAGNOSIS — I639 Cerebral infarction, unspecified: Secondary | ICD-10-CM | POA: Diagnosis not present

## 2024-07-14 DIAGNOSIS — Z87891 Personal history of nicotine dependence: Secondary | ICD-10-CM

## 2024-07-14 DIAGNOSIS — R21 Rash and other nonspecific skin eruption: Secondary | ICD-10-CM | POA: Diagnosis not present

## 2024-07-14 DIAGNOSIS — M1990 Unspecified osteoarthritis, unspecified site: Secondary | ICD-10-CM | POA: Diagnosis present

## 2024-07-14 DIAGNOSIS — N179 Acute kidney failure, unspecified: Secondary | ICD-10-CM | POA: Diagnosis not present

## 2024-07-14 DIAGNOSIS — R29717 NIHSS score 17: Secondary | ICD-10-CM

## 2024-07-14 DIAGNOSIS — I493 Ventricular premature depolarization: Secondary | ICD-10-CM | POA: Diagnosis present

## 2024-07-14 DIAGNOSIS — R4701 Aphasia: Secondary | ICD-10-CM | POA: Diagnosis present

## 2024-07-14 DIAGNOSIS — G9349 Other encephalopathy: Secondary | ICD-10-CM | POA: Diagnosis present

## 2024-07-14 DIAGNOSIS — I161 Hypertensive emergency: Secondary | ICD-10-CM | POA: Diagnosis not present

## 2024-07-14 DIAGNOSIS — I619 Nontraumatic intracerebral hemorrhage, unspecified: Secondary | ICD-10-CM | POA: Diagnosis present

## 2024-07-14 DIAGNOSIS — Z515 Encounter for palliative care: Secondary | ICD-10-CM | POA: Diagnosis not present

## 2024-07-14 DIAGNOSIS — I618 Other nontraumatic intracerebral hemorrhage: Secondary | ICD-10-CM | POA: Diagnosis present

## 2024-07-14 DIAGNOSIS — Z9911 Dependence on respirator [ventilator] status: Secondary | ICD-10-CM

## 2024-07-14 DIAGNOSIS — E039 Hypothyroidism, unspecified: Secondary | ICD-10-CM | POA: Diagnosis present

## 2024-07-14 DIAGNOSIS — I4891 Unspecified atrial fibrillation: Secondary | ICD-10-CM | POA: Diagnosis not present

## 2024-07-14 DIAGNOSIS — A419 Sepsis, unspecified organism: Secondary | ICD-10-CM | POA: Diagnosis not present

## 2024-07-14 DIAGNOSIS — I610 Nontraumatic intracerebral hemorrhage in hemisphere, subcortical: Secondary | ICD-10-CM | POA: Diagnosis not present

## 2024-07-14 DIAGNOSIS — I82431 Acute embolism and thrombosis of right popliteal vein: Secondary | ICD-10-CM | POA: Diagnosis not present

## 2024-07-14 DIAGNOSIS — I615 Nontraumatic intracerebral hemorrhage, intraventricular: Secondary | ICD-10-CM | POA: Diagnosis not present

## 2024-07-14 DIAGNOSIS — J9601 Acute respiratory failure with hypoxia: Secondary | ICD-10-CM | POA: Diagnosis not present

## 2024-07-14 DIAGNOSIS — I82413 Acute embolism and thrombosis of femoral vein, bilateral: Secondary | ICD-10-CM | POA: Diagnosis not present

## 2024-07-14 DIAGNOSIS — R578 Other shock: Secondary | ICD-10-CM | POA: Diagnosis present

## 2024-07-14 DIAGNOSIS — E162 Hypoglycemia, unspecified: Secondary | ICD-10-CM | POA: Diagnosis not present

## 2024-07-14 DIAGNOSIS — G8194 Hemiplegia, unspecified affecting left nondominant side: Secondary | ICD-10-CM | POA: Diagnosis present

## 2024-07-14 DIAGNOSIS — J690 Pneumonitis due to inhalation of food and vomit: Secondary | ICD-10-CM | POA: Diagnosis not present

## 2024-07-14 DIAGNOSIS — I82A11 Acute embolism and thrombosis of right axillary vein: Secondary | ICD-10-CM | POA: Diagnosis not present

## 2024-07-14 DIAGNOSIS — Z6820 Body mass index (BMI) 20.0-20.9, adult: Secondary | ICD-10-CM

## 2024-07-14 DIAGNOSIS — R471 Dysarthria and anarthria: Secondary | ICD-10-CM | POA: Diagnosis present

## 2024-07-14 DIAGNOSIS — E874 Mixed disorder of acid-base balance: Secondary | ICD-10-CM | POA: Diagnosis present

## 2024-07-14 DIAGNOSIS — G936 Cerebral edema: Secondary | ICD-10-CM | POA: Diagnosis present

## 2024-07-14 DIAGNOSIS — R29810 Facial weakness: Secondary | ICD-10-CM | POA: Diagnosis present

## 2024-07-14 DIAGNOSIS — E870 Hyperosmolality and hypernatremia: Secondary | ICD-10-CM | POA: Diagnosis not present

## 2024-07-14 DIAGNOSIS — R6521 Severe sepsis with septic shock: Secondary | ICD-10-CM | POA: Diagnosis not present

## 2024-07-14 DIAGNOSIS — I10 Essential (primary) hypertension: Secondary | ICD-10-CM | POA: Diagnosis present

## 2024-07-14 DIAGNOSIS — H510 Palsy (spasm) of conjugate gaze: Secondary | ICD-10-CM | POA: Diagnosis present

## 2024-07-14 DIAGNOSIS — J320 Chronic maxillary sinusitis: Secondary | ICD-10-CM | POA: Diagnosis present

## 2024-07-14 DIAGNOSIS — E43 Unspecified severe protein-calorie malnutrition: Secondary | ICD-10-CM | POA: Diagnosis not present

## 2024-07-14 DIAGNOSIS — R54 Age-related physical debility: Secondary | ICD-10-CM | POA: Diagnosis present

## 2024-07-14 DIAGNOSIS — L89156 Pressure-induced deep tissue damage of sacral region: Secondary | ICD-10-CM | POA: Diagnosis not present

## 2024-07-14 DIAGNOSIS — R1310 Dysphagia, unspecified: Secondary | ICD-10-CM | POA: Diagnosis present

## 2024-07-14 DIAGNOSIS — R4182 Altered mental status, unspecified: Secondary | ICD-10-CM | POA: Diagnosis not present

## 2024-07-14 DIAGNOSIS — E875 Hyperkalemia: Secondary | ICD-10-CM | POA: Diagnosis not present

## 2024-07-14 DIAGNOSIS — I82623 Acute embolism and thrombosis of deep veins of upper extremity, bilateral: Secondary | ICD-10-CM | POA: Diagnosis not present

## 2024-07-14 DIAGNOSIS — Z880 Allergy status to penicillin: Secondary | ICD-10-CM

## 2024-07-14 DIAGNOSIS — E876 Hypokalemia: Secondary | ICD-10-CM | POA: Diagnosis present

## 2024-07-14 DIAGNOSIS — Z888 Allergy status to other drugs, medicaments and biological substances status: Secondary | ICD-10-CM

## 2024-07-14 DIAGNOSIS — R739 Hyperglycemia, unspecified: Secondary | ICD-10-CM | POA: Diagnosis not present

## 2024-07-14 DIAGNOSIS — I82B12 Acute embolism and thrombosis of left subclavian vein: Secondary | ICD-10-CM | POA: Diagnosis not present

## 2024-07-14 DIAGNOSIS — E7220 Disorder of urea cycle metabolism, unspecified: Secondary | ICD-10-CM | POA: Diagnosis present

## 2024-07-14 DIAGNOSIS — Z66 Do not resuscitate: Secondary | ICD-10-CM | POA: Diagnosis not present

## 2024-07-14 DIAGNOSIS — R531 Weakness: Secondary | ICD-10-CM

## 2024-07-14 DIAGNOSIS — Z886 Allergy status to analgesic agent status: Secondary | ICD-10-CM

## 2024-07-14 DIAGNOSIS — G919 Hydrocephalus, unspecified: Secondary | ICD-10-CM | POA: Diagnosis present

## 2024-07-14 LAB — DIFFERENTIAL
Abs Immature Granulocytes: 0.01 K/uL (ref 0.00–0.07)
Basophils Absolute: 0 K/uL (ref 0.0–0.1)
Basophils Relative: 1 %
Eosinophils Absolute: 0.1 K/uL (ref 0.0–0.5)
Eosinophils Relative: 2 %
Immature Granulocytes: 0 %
Lymphocytes Relative: 34 %
Lymphs Abs: 2.1 K/uL (ref 0.7–4.0)
Monocytes Absolute: 0.4 K/uL (ref 0.1–1.0)
Monocytes Relative: 7 %
Neutro Abs: 3.5 K/uL (ref 1.7–7.7)
Neutrophils Relative %: 56 %

## 2024-07-14 LAB — COMPREHENSIVE METABOLIC PANEL WITH GFR
ALT: 18 U/L (ref 0–44)
AST: 29 U/L (ref 15–41)
Albumin: 3.6 g/dL (ref 3.5–5.0)
Alkaline Phosphatase: 140 U/L — ABNORMAL HIGH (ref 38–126)
Anion gap: 14 (ref 5–15)
BUN: 10 mg/dL (ref 8–23)
CO2: 22 mmol/L (ref 22–32)
Calcium: 9.4 mg/dL (ref 8.9–10.3)
Chloride: 105 mmol/L (ref 98–111)
Creatinine, Ser: 0.84 mg/dL (ref 0.61–1.24)
GFR, Estimated: >60 mL/min (ref >60)
Glucose, Bld: 105 mg/dL — ABNORMAL HIGH (ref 70–99)
Potassium: 3.5 mmol/L (ref 3.5–5.1)
Sodium: 141 mmol/L (ref 135–145)
Total Bilirubin: 0.8 mg/dL (ref 0.0–1.2)
Total Protein: 8.2 g/dL — ABNORMAL HIGH (ref 6.5–8.1)

## 2024-07-14 LAB — I-STAT CHEM 8, ED
BUN: 11 mg/dL (ref 8–23)
Calcium, Ion: 1.16 mmol/L (ref 1.15–1.40)
Chloride: 107 mmol/L (ref 98–111)
Creatinine, Ser: 0.8 mg/dL (ref 0.61–1.24)
Glucose, Bld: 103 mg/dL — ABNORMAL HIGH (ref 70–99)
HCT: 47 % (ref 39.0–52.0)
Hemoglobin: 16 g/dL (ref 13.0–17.0)
Potassium: 3.3 mmol/L — ABNORMAL LOW (ref 3.5–5.1)
Sodium: 143 mmol/L (ref 135–145)
TCO2: 25 mmol/L (ref 22–32)

## 2024-07-14 LAB — PROTIME-INR
INR: 1 (ref 0.8–1.2)
Prothrombin Time: 14 s (ref 11.4–15.2)

## 2024-07-14 LAB — CBC
HCT: 46.7 % (ref 39.0–52.0)
Hemoglobin: 15.3 g/dL (ref 13.0–17.0)
MCH: 26.7 pg (ref 26.0–34.0)
MCHC: 32.8 g/dL (ref 30.0–36.0)
MCV: 81.6 fL (ref 80.0–100.0)
Platelets: 200 K/uL (ref 150–400)
RBC: 5.72 MIL/uL (ref 4.22–5.81)
RDW: 15.1 % (ref 11.5–15.5)
WBC: 6.2 K/uL (ref 4.0–10.5)
nRBC: 0 % (ref 0.0–0.2)

## 2024-07-14 LAB — ETHANOL: Alcohol, Ethyl (B): <15 mg/dL (ref <15)

## 2024-07-14 LAB — CBG MONITORING, ED: Glucose-Capillary: 112 mg/dL — ABNORMAL HIGH (ref 70–99)

## 2024-07-14 LAB — APTT: aPTT: 33 s (ref 24–36)

## 2024-07-14 MED ORDER — IOHEXOL 350 MG/ML SOLN
65.0000 mL | Freq: Once | INTRAVENOUS | Status: AC | PRN
  Administered 2024-07-14: 65 mL via INTRAVENOUS

## 2024-07-14 MED ORDER — LABETALOL HCL 5 MG/ML IV SOLN
20.0000 mg | Freq: Once | INTRAVENOUS | Status: AC
  Administered 2024-07-14: 20 mg via INTRAVENOUS

## 2024-07-14 MED ORDER — STROKE: EARLY STAGES OF RECOVERY BOOK
Freq: Once | Status: AC
  Administered 2024-07-15: 1
  Filled 2024-07-14: qty 1

## 2024-07-14 MED ORDER — SENNOSIDES-DOCUSATE SODIUM 8.6-50 MG PO TABS
1.0000 | ORAL_TABLET | Freq: Two times a day (BID) | ORAL | Status: DC
  Administered 2024-07-16 – 2024-07-18 (×4): 1 via ORAL
  Filled 2024-07-14 (×4): qty 1

## 2024-07-14 MED ORDER — CLEVIDIPINE BUTYRATE 0.5 MG/ML IV EMUL
0.0000 mg/h | INTRAVENOUS | Status: DC
  Administered 2024-07-14 – 2024-07-15 (×3): 2 mg/h via INTRAVENOUS
  Administered 2024-07-15: 8 mg/h via INTRAVENOUS
  Filled 2024-07-14 (×4): qty 50
  Filled 2024-07-14: qty 100

## 2024-07-14 MED ORDER — PANTOPRAZOLE SODIUM 40 MG IV SOLR
40.0000 mg | Freq: Every day | INTRAVENOUS | Status: DC
  Administered 2024-07-14 – 2024-07-16 (×3): 40 mg via INTRAVENOUS
  Filled 2024-07-14 (×3): qty 10

## 2024-07-14 NOTE — ED Notes (Signed)
 Patient in CT

## 2024-07-14 NOTE — ED Provider Notes (Signed)
 [place] EMERGENCY DEPARTMENT AT [facility] Provider Note   CSN: [id number] Arrival date & time: 07/14/24  1900  An emergency department physician performed an initial assessment on this suspected stroke patient at 1905.  Patient presents with: Code Stroke   Lawrence Barnett is a 73 y.o. male, who was last seen well at 5 PM, presented to ED with left-sided weakness and right-sided gaze deviation as a code stroke.  Patient reports he went grocery shopping and felt okay earlier today and then slumped out over at home in front of his family around 5:00.  He comes [facility].  They reported the systolic blood pressure of 200.  His reported glucose of 96.   HPI     Prior to Admission medications   Medication Sig Start Date End Date Taking? Authorizing Provider  cloNIDine  (CATAPRES ) 0.1 MG tablet Take 1 tablet (0.1 mg total) by mouth 2 (two) times daily. 07/23/19   Odell Celinda Balo, MD  hydrALAZINE  (APRESOLINE ) 50 MG tablet Take 1 tablet (50 mg total) by mouth every 8 (eight) hours. 07/23/19   Odell Celinda Balo, MD    Allergies: Penicillins, Amlodipine, Lisinopril, Metoprolol, and Tylenol  [acetaminophen ]    Review of Systems  Updated Vital Signs BP (!) 161/110   Pulse 95   Temp 97.6 F (36.4 C) (Oral)   Resp (!) 24   Ht 6' (1.829 m)   Wt 67.4 kg   SpO2 99%   BMI 20.15 kg/m   Physical Exam Constitutional:      General: He is not in acute distress. HENT:     Head: Normocephalic and atraumatic.  Eyes:     Conjunctiva/sclera: Conjunctivae normal.     Pupils: Pupils are equal, round, and reactive to light.  Cardiovascular:     Rate and Rhythm: Normal rate and regular rhythm.  Pulmonary:     Effort: Pulmonary effort is normal. No respiratory distress.  Abdominal:     General: There is no distension.     Tenderness: There is no abdominal tenderness.  Skin:    General: Skin is warm and dry.  Neurological:     Mental Status: He is alert.      Comments: Left-sided hemiparesis, right-sided gaze deviation  Psychiatric:        Mood and Affect: Mood normal.        Behavior: Behavior normal.     (all labs ordered are listed, but only abnormal results are displayed) Labs Reviewed  COMPREHENSIVE METABOLIC PANEL WITH GFR - Abnormal; Notable for the following components:      Result Value   Glucose, Bld 105 (*)    Total Protein 8.2 (*)    Alkaline Phosphatase 140 (*)    All other components within normal limits  I-STAT CHEM 8, ED - Abnormal; Notable for the following components:   Potassium 3.3 (*)    Glucose, Bld 103 (*)    All other components within normal limits  CBG MONITORING, ED - Abnormal; Notable for the following components:   Glucose-Capillary 112 (*)    All other components within normal limits  PROTIME-INR  APTT  CBC  DIFFERENTIAL  ETHANOL    EKG: EKG Interpretation Date/Time:  Tuesday July 14 2024 19:28:34 EST Ventricular Rate:  91 PR Interval:  218 QRS Duration:  126 QT Interval:  422 QTC Calculation: 520 R Axis:   268  Text Interpretation: Sinus rhythm Prolonged PR interval Nonspecific IVCD with LAD Confirmed by Cottie Cough [phone]) on  07/14/2024 7:29:31 PM  Radiology: CT ANGIO HEAD NECK W WO CM (CODE STROKE) Result Date: 07/14/2024 CLINICAL DATA:  Initial evaluation for neuro deficit, stroke. EXAM: CT ANGIOGRAPHY HEAD AND NECK WITH AND WITHOUT CONTRAST TECHNIQUE: Multidetector CT imaging of the head and neck was performed using the standard protocol during bolus administration of intravenous contrast. Multiplanar CT image reconstructions and MIPs were obtained to evaluate the vascular anatomy. Carotid stenosis measurements (when applicable) are obtained utilizing NASCET criteria, using the distal internal carotid diameter as the denominator. RADIATION DOSE REDUCTION: This exam was performed according to the departmental dose-optimization program which includes automated exposure control,  adjustment of the mA and/or kV according to patient size and/or use of iterative reconstruction technique. CONTRAST:  65mL OMNIPAQUE IOHEXOL 350 MG/ML SOLN COMPARISON:  CT from earlier the same day. FINDINGS: CTA NECK FINDINGS Aortic arch: Visualized aortic arch within normal limits for caliber with standard branch pattern. Mild aortic atherosclerosis. No stenosis about the origin the great vessels. Right carotid system: No evidence of dissection, stenosis (50% or greater), or occlusion. Left carotid system: No evidence of dissection, stenosis (50% or greater), or occlusion. Vertebral arteries: Vertebral arteries are patent without significant stenosis or dissection. Left vertebral artery dominant. Skeleton: No worrisome osseous lesions. Moderate to advanced cervical spondylosis. Poor dentition. Other neck: No other acute finding. Bilateral parotid sialolithiasis. 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 1.9 cm left thyroid nodule. Upper chest: Emphysema. Review of the MIP images confirms the above findings CTA HEAD FINDINGS Anterior circulation: Both internal carotid arteries widely patent to the termini without stenosis. A1 segments widely patent. Normal anterior communicating artery complex. Both anterior cerebral arteries widely patent to their distal aspects without stenosis. No M1 stenosis or occlusion. Normal MCA bifurcations. Distal MCA branches well perfused and symmetric. Posterior circulation: Both V4 segments patent without stenosis. Both PICA patent. Basilar patent without stenosis. Superior cerebellar and posterior cerebral arteries patent bilaterally. Venous sinuses: Patent allowing for timing the contrast bolus. Anatomic variants: None significant. No aneurysm. No vascular abnormality seen underlying the acute right thalamic hemorrhage. No spot sign. Review of the MIP images confirms the above findings IMPRESSION: 1. Negative CTA of the head and neck. No large vessel occlusion or  other emergent finding. No hemodynamically significant or correctable stenosis. 2. No vascular abnormality seen underlying the acute right thalamic hemorrhage. 3. 1.9 cm left thyroid nodule. Further evaluation with dedicated thyroid ultrasound recommended. Aortic Atherosclerosis (ICD10-I70.0) and Emphysema (ICD10-J43.9). Electronically Signed   By: Morene Hoard M.D.   On: 07/14/2024 19:50   CT HEAD CODE STROKE WO CONTRAST Result Date: 07/14/2024 CLINICAL DATA:  Code stroke. Initial evaluation for acute neuro deficit, stroke suspected. EXAM: CT HEAD WITHOUT CONTRAST TECHNIQUE: Contiguous axial images were obtained from the base of the skull through the vertex without intravenous contrast. RADIATION DOSE REDUCTION: This exam was performed according to the departmental dose-optimization program which includes automated exposure control, adjustment of the mA and/or kV according to patient size and/or use of iterative reconstruction technique. COMPARISON:  Comparison made with prior CT from 07/16/2019 FINDINGS: Brain: Acute intraparenchymal hemorrhage centered at the right thalamus measures 3.2 x 2.3 x 2.0 cm (estimated volume 7 mL). Mild surrounding edema without significant regional mass effect. Intraventricular extension with blood in both lateral ventricles. No hydrocephalus. No other acute intracranial hemorrhage. No other acute large vessel territory infarct. No mass lesion or extra-axial fluid collection. Underlying atrophy with chronic small vessel ischemic disease noted. Vascular: No abnormal hyperdense vessel. Scattered vascular  calcifications noted within the carotid siphons. Skull: Scalp soft tissues within normal limits.  Calvarium intact. Sinuses/Orbits: Right gaze preference noted. Chronic frontoethmoidal and maxillary sinusitis, worse on the left. No mastoid effusion. Other: None. ASPECTS [facility] Stroke Program Early CT Score) Acute ICH, does not apply. IMPRESSION: 1. 3.2 x 2.3 x 2.0 cm  (estimated volume 7 mL) acute intraparenchymal hemorrhage centered at the right thalamus. Mild surrounding edema without significant regional mass effect. 2. Intraventricular extension with blood in both lateral ventricles. No hydrocephalus. 3. Underlying atrophy with chronic small vessel ischemic disease. These results were communicated to Dr. Vanessa at 7:17 pm on 07/14/2024 by text page via the [facility] messaging system. Electronically Signed   By: Morene Hoard M.D.   On: 07/14/2024 19:19     Procedures   Medications Ordered in the ED   stroke: early stages of recovery book (has no administration in time range)  senna-docusate (Senokot-S) tablet 1 tablet (has no administration in time range)  pantoprazole (PROTONIX) injection 40 mg (has no administration in time range)  labetalol (NORMODYNE) injection 20 mg (20 mg Intravenous Given by Other 07/14/24 1910)    And  clevidipine (CLEVIPREX) infusion 0.5 mg/mL (6 mg/hr Intravenous Rate/Dose Change 07/14/24 1950)  iohexol (OMNIPAQUE) 350 MG/ML injection 65 mL (65 mLs Intravenous Contrast Given 07/14/24 1923)                                    Medical Decision Making Amount and/or Complexity of Data Reviewed Labs: ordered. Radiology: ordered.  Risk Decision regarding hospitalization.   Patient is here with acute onset of left-sided weakness and gaze deviation at 5 PM today.  Differential would include CVA versus metabolic derangement versus seizure or other epileptic activity versus other.  Patient arrives as a code stroke activation.  He was taken emergently to CT scan with the neurology service.  I personally viewed interpreted the patient's imaging and workup, notable for hemorrhagic lesion, intraparenchymal.  CTA with no high-grade occlusion  Per the neurologist, patient will be admitted to the neuro ICU, Cleviprex ordered by neurologist for blood pressure control per intracranial bleed parameters.  Patient's airway is patent at  this time and he is not requiring intubation.  EKG per my interpretation does not show acute ischemic finding  Risk factors for stroke include high blood pressure and advanced age.  Supplemental history is provided by EMS.  This condition carries a high risk of morbidity and mortality     Final diagnoses:  Cerebrovascular accident (CVA), unspecified mechanism (HCC)  Brain bleed (HCC)  Hypertension, unspecified type  Left-sided weakness    ED Discharge Orders     None          Orella Cushman, Donnice PARAS, MD 07/14/24 KENITH

## 2024-07-14 NOTE — H&P (Signed)
 NEUROLOGY H&P NOTE   Date of service: July 14, 2024 Patient Name: DEMARYIUS IMRAN MRN:  [id number] DOB:  08-30-1951 Chief Complaint: L sided weakness, R gaze  History of Present Illness  ICHAEL PULLARA is a 73 y.o. male with hx of HTN who at baseline is able to do all ADLs. 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  He lives with his nephew who later found him on the floor and noted that he was not able to move his left side.  He uses a lot of Advil  at baseline for arthritis and pain.  EMS was called and patient was brought in as a code stroke.  He was significantly hypertensive to over 200 systolic in the field.  CT head demonstrates a 7 mL right basal ganglia ICH with intraventricular extension.  Last known well: 1700 Modified rankin score: 0-Completely asymptomatic and back to baseline post- stroke ICH Score: 1 tNKASE: Not offered due to ICH Thrombectomy: not offered due to ICH NIHSS components Score: Comment  1a Level of Conscious 0[x]  1[]  2[]  3[]      1b LOC Questions 0[x]  1[]  2[]       1c LOC Commands 0[x]  1[]  2[]       2 Best Gaze 0[]  1[]  2[x]       3 Visual 0[]  1[]  2[x]  3[]      4 Facial Palsy 0[]  1[]  2[x]  3[]      5a Motor Arm - left 0[]  1[]  2[]  3[]  4[x]  UN[]    5b Motor Arm - Right 0[x]  1[]  2[]  3[]  4[]  UN[]    6a Motor Leg - Left 0[]  1[]  2[]  3[x]  4[]  UN[]    6b Motor Leg - Right 0[x]  1[]  2[]  3[]  4[]  UN[]    7 Limb Ataxia 0[x]  1[]  2[]  UN[]      8 Sensory 0[]  1[]  2[x]  UN[]      9 Best Language 0[x]  1[]  2[]  3[]      10 Dysarthria 0[]  1[x]  2[]  UN[]      11 Extinct. and Inattention 0[]  1[x]  2[]       TOTAL: 17      ROS  Comprehensive ROS performed and pertinent positives documented in the HPI.  Past History   Past Medical History:  Diagnosis Date   Hypertension    No past surgical history on file. No family history on file. Social History   Socioeconomic History   Marital status: Single    Spouse name: Not on file   Number of  children: Not on file   Years of education: Not on file   Highest education level: Not on file  Occupational History   Not on file  Tobacco Use   Smoking status: Former   Smokeless tobacco: Never  Vaping Use   Vaping status: Never Used  Substance and Sexual Activity   Alcohol use: Yes    Alcohol/week: 1.0 standard drink of alcohol    Types: 1 Shots of liquor per week    Comment: once day    Drug use: Never   Sexual activity: Not on file  Other Topics Concern   Not on file  Social History Narrative   Not on file   Social Drivers of Health   Financial Resource Strain: Not on file  Food Insecurity: Not on file  Transportation Needs: Not on file  Physical Activity: Not on file  Stress: Not on file  Social Connections: Not on file   Allergies  Allergen Reactions   Penicillins Shortness Of Breath  Patient reports reaction occurred as a teenager and he experienced shortness of breath not requiring hospitalization.    Amlodipine Swelling   Lisinopril Swelling   Metoprolol Swelling   Tylenol  [Acetaminophen ]     Medications  (Not in a hospital admission)    Vitals   Vitals:   07/14/24 1950 07/14/24 1955 07/14/24 2000 07/14/24 2005  BP: (!) 150/94 (!) 152/98 (!) 155/93 (!) 150/92  Pulse: 95 95 96 96  Resp: (!) 22 19 (!) 26 16  Temp:      TempSrc:      SpO2: 97% 98% 100% 98%  Weight:      Height:         Body mass index is 20.15 kg/m.  Physical Exam   General: Laying comfortably in bed; in no acute distress.  HENT: Normal oropharynx and mucosa. Normal external appearance of ears and nose.  Neck: Supple, no pain or tenderness  CV: No JVD. No peripheral edema.  Pulmonary: Symmetric Chest rise. Normal respiratory effort.  Abdomen: Soft to touch, non-tender.  Ext: No cyanosis, edema, or deformity  Skin: No rash. Normal palpation of skin.   Musculoskeletal: Normal digits and nails by inspection. No clubbing.   Neurologic Examination  Mental  status/Cognition: Appears frail, alert, oriented to self, place, month and year, good attention.  Speech/language: Dysarthric speech, fluent, comprehension intact, object naming intact, repetition intact.  Cranial nerves:   CN II Pupils equal and reactive to light, left hemianopsia versus left Hemi visual field neglect   CN III,IV,VI Right gaze deviation, does not cross midline.  No nystagmus.   CN V Decreased on the left   CN VII Left facial droop   CN VIII normal hearing to speech   CN IX & X normal palatal elevation, no uvular deviation    CN XI 5/5 head turn and 5/5 shoulder shrug intact on the right   CN XII midline tongue protrusion    Motor:  Muscle bulk: Poor, tone flaccid in left upper extremity No movement in left upper extremity.  Does not respond to proximal pinch in left upper extremity. 5 out of 5 to handgrip in RUE. 5/5 hip flexion in RLE. 2/5 in L hip  Sensation:  Light touch Significantly decreased in left upper extremity and left lower extremity to light touch.   Pin prick    Temperature    Vibration   Proprioception    Coordination/Complex Motor:  - Finger to Nose intact in right upper extremity - Gait: Gait deferred for patient safety. Labs   CBC:  Recent Labs  Lab 07/14/24 1904 07/14/24 1908  WBC 6.2  --   NEUTROABS 3.5  --   HGB 15.3 16.0  HCT 46.7 47.0  MCV 81.6  --   PLT 200  --     Basic Metabolic Panel:  Lab Results  Component Value Date   NA 143 07/14/2024   K 3.3 (L) 07/14/2024   CO2 22 07/14/2024   GLUCOSE 103 (H) 07/14/2024   BUN 11 07/14/2024   CREATININE 0.80 07/14/2024   CALCIUM 9.4 07/14/2024   GFRNONAA >60 07/14/2024   GFRAA >60 07/21/2019   Lipid Panel: No results found for: LDLCALC HgbA1c: No results found for: HGBA1C Urine Drug Screen: No results found for: LABOPIA, COCAINSCRNUR, LABBENZ, AMPHETMU, THCU, LABBARB  Alcohol Level     Component Value Date/Time   [facility] <15 07/14/2024 1904   INR  Lab  Results  Component Value Date   INR 1.0 07/14/2024  APTT  Lab Results  Component Value Date   APTT 33 07/14/2024     CT Head without contrast(Personally reviewed): R thalamus ICH with IVH.  CT angio Head and Neck with contrast(Personally reviewed): No spot sign, no AVM  MRI Brain(Personally reviewed): Pending  Impression   JAMARRIUS SALAY is a 73 y.o. male with hx of HTN who at baseline is able to do all ADLs. 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  He lives with his nephew who later found him on the floor and noted that he was not able to move his left side.  He was found to have right thalamic ICH with intraventricular extension with an ICH score of 1.  In addition, also found to be in hypertensive emergency.  Primary Diagnosis:  Nontraumatic intracerebral hemorrhage in hemisphere, subcortical  Secondary Diagnosis: Hypertension Emergency (SBP > 180 or DBP > 120 & end organ damage)  Recommendations  R thalamic ICH with IVH: - Admit to ICU - Stability scan in 6 hours or STAT with any neurological decline - Frequent neuro checks; q87min for 1 hour, then q1hour - No antiplatelets or anticoagulants due to ICH - SCD for DVT prophylaxis, pharmacological DVT ppx at 24 hours if ICH is stable - Blood pressure control with goal systolic 130 - 150, cleverplex and labetalol PRN - Stroke labs, HgbA1c, fasting lipid panel - MRI brain with and without contrast when stabilized to evaluate for underlying mass - CTA head and neck - Risk factor modification - Echocardiogram - PT consult, OT consult, Speech consult. - Stroke team to follow  Hypertensive emergency: Goal SBP as above. Will use labetalol and Cleviprex for SBP above goal.  ______________________________________________________________________  Allergies discussed with patient's nephew and niece at the bedside and updated.  CODE STATUS verified with nephew Mr. [name] Laos who  also tells me that he is the healthcare power of attorney.  Patient is full code.  This patient is critically ill and at significant risk of neurological worsening, death and care requires constant monitoring of vital signs, hemodynamics,respiratory and cardiac monitoring, neurological assessment, discussion with family, other specialists and medical decision making of high complexity. I spent 60 minutes of neurocritical care time  in the care of  this patient. This was time spent independent of any time provided by nurse practitioner or PA.  Wilbert Schouten [facility] 07/14/2024  8:39 PM   Signed,  Lesette Frary, MD [facility]

## 2024-07-14 NOTE — ED Triage Notes (Signed)
 Patient presents to the ER via EMS from home with stroke like symptoms. Patient was last known normal today at 1700. Patient reports sudden onset of incoordination and left sided deficits. Patient denies pain.

## 2024-07-14 NOTE — Code Documentation (Signed)
 Stroke Response Nurse Documentation Code Documentation  Lawrence Barnett is a 73 y.o. male arriving to [facility]  via [facility] on 11/11 with past medical hx of HTN. On No antithrombotic. Code stroke was activated by EMS.   Patient from work where he was LKW at 1715 and now complaining of sudden onset of left sided weakness.   Stroke team at the bedside on patient arrival. Labs drawn and patient cleared for CT by Dr. Cottie. Patient to CT with team. NIHSS 14, see documentation for details and code stroke times. Patient with right gaze preference , left facial droop, left arm weakness, left leg weakness, left decreased sensation, and dysarthria  on exam. The following imaging was completed:  CT Head and CTA. Patient is not a candidate for IV Thrombolytic due to ICH. Patient is not a candidate for IR due to ICH.   Care Plan: Neuro checks q1 hr.    Bedside handoff with ED RN Dedra.    Griselda Alm ORN  Rapid Response RN

## 2024-07-15 ENCOUNTER — Inpatient Hospital Stay (HOSPITAL_COMMUNITY)

## 2024-07-15 DIAGNOSIS — I69391 Dysphagia following cerebral infarction: Secondary | ICD-10-CM

## 2024-07-15 DIAGNOSIS — R29712 NIHSS score 12: Secondary | ICD-10-CM | POA: Diagnosis not present

## 2024-07-15 DIAGNOSIS — I619 Nontraumatic intracerebral hemorrhage, unspecified: Secondary | ICD-10-CM

## 2024-07-15 DIAGNOSIS — I10 Essential (primary) hypertension: Secondary | ICD-10-CM | POA: Diagnosis not present

## 2024-07-15 DIAGNOSIS — I615 Nontraumatic intracerebral hemorrhage, intraventricular: Secondary | ICD-10-CM | POA: Diagnosis not present

## 2024-07-15 DIAGNOSIS — I610 Nontraumatic intracerebral hemorrhage in hemisphere, subcortical: Secondary | ICD-10-CM | POA: Diagnosis not present

## 2024-07-15 LAB — ECHOCARDIOGRAM COMPLETE
Area-P 1/2: 6.32 cm2
Height: 72 in
S' Lateral: 3 cm
Weight: 2239.87 [oz_av]

## 2024-07-15 LAB — RAPID URINE DRUG SCREEN, HOSP PERFORMED
Amphetamines: NOT DETECTED
Barbiturates: NOT DETECTED
Benzodiazepines: NOT DETECTED
Cocaine: NOT DETECTED
Opiates: NOT DETECTED
Tetrahydrocannabinol: NOT DETECTED

## 2024-07-15 LAB — MRSA NEXT GEN BY PCR, NASAL: MRSA by PCR Next Gen: NOT DETECTED

## 2024-07-15 MED ORDER — CHLORHEXIDINE GLUCONATE CLOTH 2 % EX PADS
6.0000 | MEDICATED_PAD | Freq: Every day | CUTANEOUS | Status: DC
  Administered 2024-07-15: 6 via TOPICAL

## 2024-07-15 MED ORDER — HYDRALAZINE HCL 20 MG/ML IJ SOLN
10.0000 mg | INTRAMUSCULAR | Status: DC | PRN
  Administered 2024-07-16 – 2024-07-28 (×6): 10 mg via INTRAVENOUS
  Filled 2024-07-15 (×6): qty 1

## 2024-07-15 MED ORDER — LABETALOL HCL 5 MG/ML IV SOLN
10.0000 mg | INTRAVENOUS | Status: DC | PRN
  Administered 2024-07-15 – 2024-07-17 (×5): 10 mg via INTRAVENOUS
  Filled 2024-07-15 (×5): qty 4

## 2024-07-15 MED ORDER — SODIUM CHLORIDE 0.9 % IV SOLN: INTRAVENOUS | Status: DC

## 2024-07-15 MED ORDER — ORAL CARE MOUTH RINSE: 15.0000 mL | OROMUCOSAL | Status: DC | PRN

## 2024-07-15 NOTE — Progress Notes (Signed)
 Chaplain responded to spiritual care consult in regards to durable POA. I consulted with the RN, and explained we are unable to complete those. HCPOA and Living Will only.   RN expressed understanding and assured me he would reach out as any further needs arise.

## 2024-07-15 NOTE — TOC CM/SW Note (Signed)
 Transition of Care [facility]) - Inpatient Brief Assessment   Patient Details  Name: Lawrence Barnett MRN: [id number] Date of Birth: [birth year]  Transition of Care [facility]) CM/SW Contact:    Blain Hunsucker M, RN Phone Number: 07/15/2024, 5:06 PM   Clinical Narrative: Lawrence Barnett is a 73 y.o. male with hx of HTN who at baseline is able to do all ADLs. He got off work on 11/11 and went to [employer] and last spoke with his family at 1700.  He lives with his nephew who later found him on the floor and noted that he was not able to move his left side. 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   Inpatient Care Management will continue to follow as patient progresses.      Transition of Care Asessment: Insurance and Status: Insurance coverage has been reviewed Patient has primary care physician: Yes Lawrence Barnett) Home environment has been reviewed: Lives with nephew Prior level of function:: Independent, working Forensic Psychologist: No current home services Social Drivers of Health Review: SDOH reviewed needs interventions Readmission risk has been reviewed: Yes Transition of care needs: transition of care needs identified, TOC will continue to follow   Mliss MICAEL Fass, RN, BSN  Trauma/Neuro ICU Case Manager [phone]

## 2024-07-15 NOTE — Evaluation (Signed)
 Clinical/Bedside Swallow Evaluation Patient Details  Name: Lawrence Barnett MRN: [id number] Date of Birth: April 08, 1951  Today's Date: 07/15/2024 Time: SLP Start Time (ACUTE ONLY): 0944 SLP Stop Time (ACUTE ONLY): 0950 SLP Time Calculation (min) (ACUTE ONLY): 6 min  Past Medical History:  Past Medical History:  Diagnosis Date   Hypertension    Past Surgical History: No past surgical history on file. HPI:  Lawrence Barnett is a 73 y.o. male who presented with left sided weakness.  Pt found to have RBG/thal ICH. CT 11/12: 1. Slight interval increase in size of right thalamic hemorrhage,  now measuring 3.6 x 2.7 x 2.2 cm (estimated volume 10.5 mL,  previously 7 mL). Mild surrounding edema, slightly increased.  2. Intraventricular extension with blood in both lateral ventricles,  also slightly increased. Stable ventricular size and morphology  without hydrocephalus.  Pt with pmhx HTN.    Assessment / Plan / Recommendation  Clinical Impression  Pt presents with clinical indicators of pharygneal dysphagia in setting of knew RBG/thalamic hemorrhage.  SLP provided oral care prior to administration of PO trials. Pt with throat clearing consistently after small amounts of water by teaspoon.  There was immediate wet cough with sip of water from cup.  Pt exhibited good oral clearance of puree with no s/s of aspiration.  Recommend further evaluation of pharyngeal swallow function via MBS planned for later this date.  Pt may have small sips of water by spoon, in moderation, after good oral care, when fully awake/alert, with upright positioning and 1:1 assistance.     Recommend pt remain NPO pending instrumental assessment.  Priority oral meds cough be given crushed with puree if necessary.  Prefer non oral route, or holding medications until instrumental assessment is completed.  SLP Visit Diagnosis: Dysphagia, oropharyngeal phase (R13.12)    Aspiration Risk  Moderate aspiration risk    Diet  Recommendation NPO    Medication Administration: Via alternative means (priority oral meds could be given crushed with puree if necessary)    Other  Recommendations Oral Care Recommendations: Oral care prior to ice chip/H20;Oral care QID     Assistance Recommended at Discharge    Functional Status Assessment Patient has had a recent decline in their functional status and demonstrates the ability to make significant improvements in function in a reasonable and predictable amount of time.  Frequency and Duration  (TBD)          Prognosis Prognosis for improved oropharyngeal function: Good      Swallow Study   General Date of Onset: 07/14/24 HPI: Lawrence Barnett is a 73 y.o. male who presented with left sided weakness.  Pt found to have RBG/thal ICH. CT 11/12: 1. Slight interval increase in size of right thalamic hemorrhage,  now measuring 3.6 x 2.7 x 2.2 cm (estimated volume 10.5 mL,  previously 7 mL). Mild surrounding edema, slightly increased.  2. Intraventricular extension with blood in both lateral ventricles,  also slightly increased. Stable ventricular size and morphology  without hydrocephalus.  Pt with pmhx HTN. Type of Study: Bedside Swallow Evaluation Previous Swallow Assessment: None Diet Prior to this Study: NPO History of Recent Intubation: No Behavior/Cognition: Alert;Cooperative;Distractible;Requires cueing Oral Cavity Assessment: Within Functional Limits Oral Care Completed by SLP: Yes Oral Cavity - Dentition: Edentulous Self-Feeding Abilities: Needs assist Patient Positioning: Upright in bed Baseline Vocal Quality: Low vocal intensity Volitional Cough: Strong Volitional Swallow: Unable to elicit (2/2 xerostomia per pt report)    Oral/Motor/Sensory Function Overall Oral Motor/Sensory Function:  Moderate impairment Facial ROM: Reduced left Facial Symmetry: Abnormal symmetry left Lingual ROM: Reduced right Lingual Symmetry: Abnormal symmetry left Lingual Strength:  Within Functional Limits Velum: Within Functional Limits Mandible: Within Functional Limits   Ice Chips Ice chips: Not tested   Thin Liquid Thin Liquid: Impaired Presentation: Spoon;Cup Pharyngeal  Phase Impairments: Throat Clearing - Immediate;Cough - Immediate;Suspected delayed Swallow    Nectar Thick Nectar Thick Liquid: Not tested   Honey Thick Honey Thick Liquid: Not tested   Puree Puree: Within functional limits Presentation: Spoon   Solid     Solid: Not tested      Anette FORBES Grippe, MA, CCC-SLP Acute Rehabilitation Services Office: [phone] 07/15/2024,10:17 AM

## 2024-07-15 NOTE — Progress Notes (Signed)
 Echocardiogram 2D Echocardiogram has been performed.  Lawrence Barnett 07/15/2024, 10:34 AM

## 2024-07-15 NOTE — Progress Notes (Addendum)
 STROKE TEAM PROGRESS NOTE    SIGNIFICANT HOSPITAL EVENTS 11/11: Patient admitted with right basal ganglia ICH with IVH  INTERIM HISTORY/SUBJECTIVE  Patient continues to require Cleviprex to maintain blood pressure within parameters.  He did not pass his swallow study and will get a cortrak today.  OBJECTIVE  CBC    Component Value Date/Time   WBC 6.2 07/14/2024 1904   RBC 5.72 07/14/2024 1904   HGB 16.0 07/14/2024 1908   HGB 16.2 10/07/2012 1618   HCT 47.0 07/14/2024 1908   HCT 47.8 10/07/2012 1618   PLT 200 07/14/2024 1904   PLT 306 10/07/2012 1618   MCV 81.6 07/14/2024 1904   MCV 92 10/07/2012 1618   MCH 26.7 07/14/2024 1904   MCHC 32.8 07/14/2024 1904   RDW 15.1 07/14/2024 1904   RDW 13.9 10/07/2012 1618   LYMPHSABS 2.1 07/14/2024 1904   MONOABS 0.4 07/14/2024 1904   EOSABS 0.1 07/14/2024 1904   BASOSABS 0.0 07/14/2024 1904    BMET    Component Value Date/Time   NA 143 07/14/2024 1908   NA 139 10/08/2012 0431   K 3.3 (L) 07/14/2024 1908   K 4.0 10/08/2012 0431   CL 107 07/14/2024 1908   CL 107 10/08/2012 0431   CO2 22 07/14/2024 1904   CO2 25 10/08/2012 0431   GLUCOSE 103 (H) 07/14/2024 1908   GLUCOSE 133 (H) 10/08/2012 0431   BUN 11 07/14/2024 1908   BUN 10 10/08/2012 0431   CREATININE 0.80 07/14/2024 1908   CREATININE 0.83 10/08/2012 0431   CALCIUM 9.4 07/14/2024 1904   CALCIUM 9.4 10/08/2012 0431   GFRNONAA >60 07/14/2024 1904   GFRNONAA >60 10/08/2012 0431    IMAGING past 24 hours CT HEAD POST STROKE FOLLOWUP/TIMED/STAT READ Result Date: 07/15/2024 CLINICAL DATA:  Follow-up examination for stroke. EXAM: CT HEAD WITHOUT CONTRAST TECHNIQUE: Contiguous axial images were obtained from the base of the skull through the vertex without intravenous contrast. RADIATION DOSE REDUCTION: This exam was performed according to the departmental dose-optimization program which includes automated exposure control, adjustment of the mA and/or kV according to patient  size and/or use of iterative reconstruction technique. COMPARISON:  Comparison made with CTs from 07/14/2024. FINDINGS: Brain: Previously identified intraparenchymal hemorrhage centered at the right thalamus again seen. Hemorrhage is slightly increased in size now measuring 3.6 x 2.7 x 2.2 cm (estimated volume 10.5 mL, previously 7 mL). Mild surrounding edema, slightly increased. Intraventricular extension with blood in both lateral ventricles, also slightly increased. Stable ventricular size and morphology without hydrocephalus. No other acute intracranial hemorrhage. No other acute large vessel territory infarct. No mass lesion or midline shift. No extra-axial fluid collection. Underlying atrophy with chronic microvascular ischemic disease noted. Vascular: No abnormal hyperdense vessel. Skull: Scalp soft tissues and calvarium demonstrate no new finding. Sinuses/Orbits: Globes orbital soft tissues demonstrate no acute finding. 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 Mastoid air cells remain largely clear. Other: None. IMPRESSION: 1. Slight interval increase in size of right thalamic hemorrhage, now measuring 3.6 x 2.7 x 2.2 cm (estimated volume 10.5 mL, previously 7 mL). Mild surrounding edema, slightly increased. 2. Intraventricular extension with blood in both lateral ventricles, also slightly increased. Stable ventricular size and morphology without hydrocephalus. 3. Otherwise no other new acute intracranial abnormality. Electronically Signed   By: Morene Hoard M.D.   On: 07/15/2024 03:07   CT ANGIO HEAD NECK W WO CM (CODE STROKE) Result Date: 07/14/2024 CLINICAL DATA:  Initial evaluation for neuro deficit, stroke.  EXAM: CT ANGIOGRAPHY HEAD AND NECK WITH AND WITHOUT CONTRAST TECHNIQUE: Multidetector CT imaging of the head and neck was performed using the standard protocol during bolus administration of intravenous contrast. Multiplanar CT image reconstructions and MIPs were  obtained to evaluate the vascular anatomy. Carotid stenosis measurements (when applicable) are obtained utilizing NASCET criteria, using the distal internal carotid diameter as the denominator. RADIATION DOSE REDUCTION: This exam was performed according to the departmental dose-optimization program which includes automated exposure control, adjustment of the mA and/or kV according to patient size and/or use of iterative reconstruction technique. CONTRAST:  65mL OMNIPAQUE IOHEXOL 350 MG/ML SOLN COMPARISON:  CT from earlier the same day. FINDINGS: CTA NECK FINDINGS Aortic arch: Visualized aortic arch within normal limits for caliber with standard branch pattern. Mild aortic atherosclerosis. No stenosis about the origin the great vessels. Right carotid system: No evidence of dissection, stenosis (50% or greater), or occlusion. Left carotid system: No evidence of dissection, stenosis (50% or greater), or occlusion. Vertebral arteries: Vertebral arteries are patent without significant stenosis or dissection. Left vertebral artery dominant. Skeleton: No worrisome osseous lesions. Moderate to advanced cervical spondylosis. Poor dentition. Other neck: No other acute finding. Bilateral parotid sialolithiasis. 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 1.9 cm left thyroid nodule. Upper chest: Emphysema. Review of the MIP images confirms the above findings CTA HEAD FINDINGS Anterior circulation: Both internal carotid arteries widely patent to the termini without stenosis. A1 segments widely patent. Normal anterior communicating artery complex. Both anterior cerebral arteries widely patent to their distal aspects without stenosis. No M1 stenosis or occlusion. Normal MCA bifurcations. Distal MCA branches well perfused and symmetric. Posterior circulation: Both V4 segments patent without stenosis. Both PICA patent. Basilar patent without stenosis. Superior cerebellar and posterior cerebral arteries patent  bilaterally. Venous sinuses: Patent allowing for timing the contrast bolus. Anatomic variants: None significant. No aneurysm. No vascular abnormality seen underlying the acute right thalamic hemorrhage. No spot sign. Review of the MIP images confirms the above findings IMPRESSION: 1. Negative CTA of the head and neck. No large vessel occlusion or other emergent finding. No hemodynamically significant or correctable stenosis. 2. No vascular abnormality seen underlying the acute right thalamic hemorrhage. 3. 1.9 cm left thyroid nodule. Further evaluation with dedicated thyroid ultrasound recommended. Aortic Atherosclerosis (ICD10-I70.0) and Emphysema (ICD10-J43.9). Electronically Signed   By: Morene Hoard M.D.   On: 07/14/2024 19:50   CT HEAD CODE STROKE WO CONTRAST Result Date: 07/14/2024 CLINICAL DATA:  Code stroke. Initial evaluation for acute neuro deficit, stroke suspected. EXAM: CT HEAD WITHOUT CONTRAST TECHNIQUE: Contiguous axial images were obtained from the base of the skull through the vertex without intravenous contrast. RADIATION DOSE REDUCTION: This exam was performed according to the departmental dose-optimization program which includes automated exposure control, adjustment of the mA and/or kV according to patient size and/or use of iterative reconstruction technique. COMPARISON:  Comparison made with prior CT from 07/16/2019 FINDINGS: Brain: Acute intraparenchymal hemorrhage centered at the right thalamus measures 3.2 x 2.3 x 2.0 cm (estimated volume 7 mL). Mild surrounding edema without significant regional mass effect. Intraventricular extension with blood in both lateral ventricles. No hydrocephalus. No other acute intracranial hemorrhage. No other acute large vessel territory infarct. No mass lesion or extra-axial fluid collection. Underlying atrophy with chronic small vessel ischemic disease noted. Vascular: No abnormal hyperdense vessel. Scattered vascular calcifications noted within  the carotid siphons. Skull: Scalp soft tissues within normal limits.  Calvarium intact. Sinuses/Orbits: Right gaze preference noted. Chronic frontoethmoidal and maxillary  sinusitis, worse on the left. No mastoid effusion. Other: None. ASPECTS [facility] Stroke Program Early CT Score) Acute ICH, does not apply. IMPRESSION: 1. 3.2 x 2.3 x 2.0 cm (estimated volume 7 mL) acute intraparenchymal hemorrhage centered at the right thalamus. Mild surrounding edema without significant regional mass effect. 2. Intraventricular extension with blood in both lateral ventricles. No hydrocephalus. 3. Underlying atrophy with chronic small vessel ischemic disease. These results were communicated to Dr. Vanessa at 7:17 pm on 07/14/2024 by text page via the [facility] messaging system. Electronically Signed   By: Morene Hoard M.D.   On: 07/14/2024 19:19    Vitals:   07/15/24 1030 07/15/24 1045 07/15/24 1100 07/15/24 1115  BP: (!) 147/96 (!) 144/99 (!) 150/100 (!) 147/102  Pulse: (!) 106 (!) 103 (!) 103 (!) 109  Resp: (!) 25 (!) 25 (!) 22 16  Temp:      TempSrc:      SpO2: 100% 100% 99% 98%  Weight:      Height:         PHYSICAL EXAM General:  Alert, well-nourished, well-developed elderly patient in no acute distress Psych:  Mood and affect appropriate for situation CV: Regular rate and rhythm on monitor Respiratory:  Regular, unlabored respirations on room air GI: Abdomen soft and nontender   NEURO:  Mental Status: AA&Ox3, patient is not able to give much history Speech/Language: speech is with significant dysarthria with naming, repetition, and comprehension intact.  Cranial Nerves:  II: PERRL. Visual fields full.  III, IV, VI: Right gaze preference, able to cross midline V: Sensation is intact to light touch and symmetrical to face.  VII: Left facial droop VIII: hearing intact to voice. IX, X: Voice is dysarthric XII: tongue is midline without fasciculations. Motor: Able to follow commands with  right upper and lower extremities with antigravity strength, no movement of left upper or lower extremities with positive Babinski reflex noted in left lower extremity Tone: is normal and bulk is normal Sensation-sensation diminished on the left Coordination: FTN intact on the right, unable to perform on the left Gait- deferred  Most Recent NIH  1a Level of Conscious.: 0 1b LOC Questions: 0 1c LOC Commands: 0 2 Best Gaze: 1 3 Visual: 0 4 Facial Palsy: 1 5a Motor Arm - left: 4 5b Motor Arm - Right: 0 6a Motor Leg - Left: 4 6b Motor Leg - Right: 0 7 Limb Ataxia: 0 8 Sensory: 0 9 Best Language: 0 10 Dysarthria: 2 11 Extinct. and Inatten.: 0 TOTAL: 12   ASSESSMENT/PLAN  Lawrence Barnett is a 73 y.o. male with history of hypertension admitted for left-sided hemiplegia.  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  He did not pass his swallow study and will have cortrak inserted NIH on Admission 17  ICH:  right ganglia/thalamus ICH with IVH, etiology: Likely hypertensive Code Stroke CT head 7 mm acute IPH centered at right thalamus with IVH, no hydrocephalus CTA head & neck no LVO or hemodynamically significant stenosis, no vascular abnormality underlying right thalamic hemorrhage  Follow-up head CT slight interval increase in size of right thalamic hemorrhage, slightly increased IVH MRI pending 2D Echo EF 55-60% LDL pending HgbA1c pending UDS neg VTE prophylaxis - SCDs No antithrombotic prior to admission, now on No antithrombotic secondary to IPH Therapy recommendations:  Pending Disposition: Pending  Hypertension Home meds: None BP high on presentation  Now stable On low dose Cleviprex Blood Pressure Goal: SBP between 130-150  for 24 hours and then less than 160  Long-term BP goal normotensive  Lipid management Home meds: None LDL pending, goal < 70 Add statin at discharge if LDL above goal  Dysphagia Patient has post-stroke  dysphagia, SLP consulted  n.p.o. did not pass swallow Will need cortrak for TF On IVF @ 48  Other Stroke Risk Factors Advanced age  Other Active Problems   Hospital day # 1  Patient seen by NP with MD, MD to edit note as needed. Cortney E Everitt Clint Kill , MSN, AGACNP-BC [facility] See Amion for schedule and pager information 07/15/2024 12:32 PM  ATTENDING NOTE: I reviewed above note and agree with the assessment and plan. Pt was seen and examined.   RN is at the bedside. Pt awake, alert, eyes open, orientated to age, place, time. No aphasia, paucity of speech, mild dysarthric, but following all simple commands. Able to name 3/3 and repeat in dysarthric voice. R gaze palsy preference and L gaze incomplete, tracking bilaterally with voice, visual field full, PERRL. L facial droop. Tongue midline. LUE flaccid. LLE flaccid. RUE and RLE 5/5. Sensation diminished on the left, R FTN intact grossly, gait not tested.   For detailed assessment and plan, please refer to above as I have made changes wherever appropriate.   Ary Cummins, MD PhD Stroke Neurology 07/15/2024 4:56 PM  This patient is critically ill due to ICH and IVH, hypertensive emergency and at significant risk of neurological worsening, death form hematoma expansion, obstructive hydrocephalus, cerebral edema, brain herniation. This patient's care requires constant monitoring of vital signs, hemodynamics, respiratory and cardiac monitoring, review of multiple databases, neurological assessment, discussion with family, other specialists and medical decision making of high complexity. I spent 40 minutes of neurocritical care time in the care of this patient.      To contact Stroke Continuity provider, please refer to [email or web address]. After hours, contact General Neurology

## 2024-07-15 NOTE — Progress Notes (Signed)
 Modified Barium Swallow Study  Patient Details  Name: Lawrence Barnett MRN: [id number] Date of Birth: 1950/09/28  Today's Date: 07/15/2024  Modified Barium Swallow completed.  Full report located under Chart Review in the Imaging Section.  History of Present Illness 73 yo male with history of HTN who presents to ED 11/11 with L sided weakness. 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 MRI pending.   Clinical Impression Pt exhibits a primary oral dysphagia with mild pharyngeal deficits. He holds boluses within his oral cavity for prolonged periods of time and initiates a swallow only when it reaches his pyriform sinuses. Despite complete laryngeal elevation, this mistiming allows penetration during the swallow with thin liquids that eventually progresses below the vocal folds (PAS 7). Although oral holding occurs with all consistencies, the bolus is better controlled as it enters the pharynx with nectar thick liquids, preventing frank penetration and aspiration. Trace superficial penetrates are expelled with a volitional throat clear (PAS 3). Oral transit became increasingly disorganized with solids, which may also be affected by missing dentition. Purees and solids resulted in increased residue along the BOT, valleculae, and posterior pharyngeal wall, with minimally improved clearance using a nectar thick liquid wash. He was unable to swallow the 13 mm barium tablet with nectar thick liquids, necessitating expectoration. Question his ability to meet nutritional needs given the extent of oral holding observed. Can offer nectar thick liquids from floor stock with full supervision to monitor for oral clearance. Otherwise, recommend he remain NPO with consideration of short-term enteral nutrition.  Factors that may increase risk of adverse event in presence of aspiration Noe & Lianne 2021): Reduced cognitive function;Limited mobility;Frail or deconditioned  Swallow Evaluation  Recommendations Recommendations: NPO (x nectar thick liquids from floorstock) Medication Administration: Via alternative means Supervision: Full assist for feeding;Full supervision/cueing for swallowing strategies Swallowing strategies  : Check for pocketing or oral holding Postural changes: Position pt fully upright for meals;Stay upright 30-60 min after meals Oral care recommendations: Oral care QID (4x/day);Oral care before ice chips/water;Staff/trained caregiver to provide oral care Caregiver Recommendations: Avoid jello, ice cream, thin soups, popsicles;Remove water pitcher;Have oral suction available     Damien Blumenthal, M.A., CCC-SLP Speech Language Pathology, Acute Rehabilitation Services  Secure Chat preferred [phone]  07/15/2024,3:47 PM

## 2024-07-16 ENCOUNTER — Inpatient Hospital Stay (HOSPITAL_COMMUNITY)

## 2024-07-16 DIAGNOSIS — I10 Essential (primary) hypertension: Secondary | ICD-10-CM | POA: Diagnosis not present

## 2024-07-16 DIAGNOSIS — I615 Nontraumatic intracerebral hemorrhage, intraventricular: Secondary | ICD-10-CM | POA: Diagnosis not present

## 2024-07-16 DIAGNOSIS — I610 Nontraumatic intracerebral hemorrhage in hemisphere, subcortical: Secondary | ICD-10-CM | POA: Diagnosis not present

## 2024-07-16 DIAGNOSIS — R29712 NIHSS score 12: Secondary | ICD-10-CM | POA: Diagnosis not present

## 2024-07-16 LAB — BASIC METABOLIC PANEL WITH GFR
Anion gap: 10 (ref 5–15)
BUN: 12 mg/dL (ref 8–23)
CO2: 24 mmol/L (ref 22–32)
Calcium: 9.9 mg/dL (ref 8.9–10.3)
Chloride: 108 mmol/L (ref 98–111)
Creatinine, Ser: 1.03 mg/dL (ref 0.61–1.24)
GFR, Estimated: >60 mL/min (ref >60)
Glucose, Bld: 97 mg/dL (ref 70–99)
Potassium: 4.3 mmol/L (ref 3.5–5.1)
Sodium: 142 mmol/L (ref 135–145)

## 2024-07-16 LAB — LIPID PANEL
Cholesterol: 158 mg/dL (ref 0–200)
HDL: 71 mg/dL (ref >40)
LDL Cholesterol: 73 mg/dL (ref 0–99)
Total CHOL/HDL Ratio: 2.2 ratio
Triglycerides: 69 mg/dL (ref <150)
VLDL: 14 mg/dL (ref 0–40)

## 2024-07-16 LAB — CBC
HCT: 50.7 % (ref 39.0–52.0)
Hemoglobin: 16.8 g/dL (ref 13.0–17.0)
MCH: 27.5 pg (ref 26.0–34.0)
MCHC: 33.1 g/dL (ref 30.0–36.0)
MCV: 83 fL (ref 80.0–100.0)
Platelets: 226 K/uL (ref 150–400)
RBC: 6.11 MIL/uL — ABNORMAL HIGH (ref 4.22–5.81)
RDW: 16.1 % — ABNORMAL HIGH (ref 11.5–15.5)
WBC: 5.8 K/uL (ref 4.0–10.5)
nRBC: 0 % (ref 0.0–0.2)

## 2024-07-16 LAB — HEMOGLOBIN A1C
Hgb A1c MFr Bld: 5.6 % (ref 4.8–5.6)
Mean Plasma Glucose: 114.02 mg/dL

## 2024-07-16 MED ORDER — LOSARTAN POTASSIUM 50 MG PO TABS
100.0000 mg | ORAL_TABLET | Freq: Every day | ORAL | Status: DC
  Administered 2024-07-16 – 2024-07-18 (×3): 100 mg via ORAL
  Filled 2024-07-16 (×3): qty 2

## 2024-07-16 MED ORDER — GADOBUTROL 1 MMOL/ML IV SOLN
6.0000 mL | Freq: Once | INTRAVENOUS | Status: AC | PRN
  Administered 2024-07-16: 6 mL via INTRAVENOUS

## 2024-07-16 MED ORDER — HEPARIN SODIUM (PORCINE) 5000 UNIT/ML IJ SOLN
5000.0000 [IU] | Freq: Three times a day (TID) | INTRAMUSCULAR | Status: DC
  Administered 2024-07-16 – 2024-07-19 (×10): 5000 [IU] via SUBCUTANEOUS
  Filled 2024-07-16 (×11): qty 1

## 2024-07-16 NOTE — Evaluation (Signed)
 Physical Therapy Evaluation Patient Details Name: Lawrence Barnett MRN: [id number] DOB: [birth year] Today's Date: 07/16/2024  History of Present Illness  73 y.o. male presents to [facility] hospital on 07/14/2024 after being found down with L weakness. 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 PMH includes HTN.  Clinical Impression  Pt admitted secondary to problem above with deficits below. PTA patient lived alone and was working full-time. Pt currently requires +54mod-max for all aspects of mobility. Sitting balance very poor with primarily Left lean, however loses balance in all directions. Able to stand with LLE flexed but not buckling. Due to poor sitting balance and poor awareness of deficits, pt returned to bed. Anticipate patient will benefit from PT to address problems listed below. Will continue to follow acutely to maximize functional mobility, independence, and safety.  Patient will benefit from continued inpatient follow up therapy, >3 hours/day.           If plan is discharge home, recommend the following: Two people to help with walking and/or transfers;Assistance with feeding;Direct supervision/assist for medications management;Assist for transportation;Direct supervision/assist for financial management;Help with stairs or ramp for entrance;Supervision due to cognitive status   Can travel by private vehicle        Equipment Recommendations Wheelchair (measurements PT);Wheelchair cushion (measurements PT);Hospital bed  Recommendations for Other Services  Rehab consult    Functional Status Assessment Patient has had a recent decline in their functional status and demonstrates the ability to make significant improvements in function in a reasonable and predictable amount of time.     Precautions / Restrictions Precautions Precautions: Fall Recall of Precautions/Restrictions: Impaired Precaution/Restrictions Comments: BP <160 Restrictions Weight Bearing Restrictions Per  Provider Order: No      Mobility  Bed Mobility Overal bed mobility: Needs Assistance Bed Mobility: Supine to Sit, Sit to Supine     Supine to sit: Mod assist, +2 for physical assistance Sit to supine: Mod assist, +2 for physical assistance   General bed mobility comments: mod assist +2 for L LE, trunk, and scooting hips    Transfers Overall transfer level: Needs assistance Equipment used: 2 person hand held assist Transfers: Sit to/from Stand Sit to Stand: Max assist, +2 physical assistance                Ambulation/Gait                  Stairs            Wheelchair Mobility     Tilt Bed    Modified Rankin (Stroke Patients Only)       Balance Overall balance assessment: Needs assistance Sitting-balance support: No upper extremity supported, Feet supported Sitting balance-Leahy Scale: Zero Sitting balance - Comments: at best min assist with R UE support on rail/chair arm but without statically max assist with heavy L lateral lean but at times all directions Postural control: Left lateral lean Standing balance support: Bilateral upper extremity supported, During functional activity Standing balance-Leahy Scale: Zero Standing balance comment: max +2                             Pertinent Vitals/Pain Pain Assessment Pain Assessment: No/denies pain    Home Living Family/patient expects to be discharged to:: Private residence Living Arrangements: Alone Available Help at Discharge: Family;Available 24 hours/day Type of Home: House Home Access: Stairs to enter Entrance Stairs-Rails: Doctor, General Practice of Steps: 6   Home Layout:  One level Home Equipment: Grab bars - toilet;Grab bars - tub/shower      Prior Function Prior Level of Function : Independent/Modified Independent;Driving;Working/employed               ADLs Comments: 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 PROM WFL. LUE Sensation: decreased light touch;decreased proprioception LUE Coordination: decreased fine motor;decreased gross motor    Lower Extremity Assessment Lower Extremity Assessment: LLE deficits/detail LLE Deficits / Details: brisk withdraw to pain; hip adduction noted with bed mobility; knee soft but not buckling in standing LLE Sensation:  (difficult to assess)    Cervical / Trunk Assessment Cervical / Trunk Assessment: Kyphotic  Communication   Communication Communication: Impaired Factors Affecting Communication: Reduced clarity of speech (soft spoken)    Cognition Arousal: Alert Behavior During Therapy: WFL for tasks assessed/performed                           PT - Cognition Comments: stating that he was up walking this morning in hospital room (clearly not physically possible) Following commands: Impaired Following commands impaired: Follows one step commands with increased time, Follows multi-step commands inconsistently     Cueing Cueing Techniques: Verbal cues, Visual cues, Tactile cues     General Comments General comments (skin integrity, edema, etc.): VSS on RA    Exercises     Assessment/Plan    PT Assessment Patient needs continued PT services  PT Problem List Decreased strength;Decreased balance;Decreased mobility;Decreased cognition;Decreased knowledge of use of DME;Decreased safety awareness;Decreased knowledge of precautions;Impaired sensation;Impaired tone       PT Treatment Interventions DME instruction;Gait training;Functional mobility training;Therapeutic activities;Therapeutic exercise;Balance training;Neuromuscular re-education;Cognitive remediation;Patient/family education    PT Goals (Current goals can be found in the Care Plan section)  Acute Rehab PT  Goals Patient Stated Goal: return to work PT Goal Formulation: With patient Time For Goal Achievement: 07/30/24 Potential to Achieve Goals: Good    Frequency Min 3X/week     Co-evaluation PT/OT/SLP Co-Evaluation/Treatment: Yes Reason for Co-Treatment: For patient/therapist safety;To address functional/ADL transfers PT goals addressed during session: Mobility/safety with mobility OT goals addressed during session: ADL's and self-care       AM-PAC PT 6 Clicks Mobility  Outcome Measure Help needed turning from your back to your side while in a flat bed without using bedrails?: A Lot Help needed moving from lying on your back to sitting on the side of a flat bed without using bedrails?: Total Help needed moving to and from a bed to a chair (including a wheelchair)?: Total Help needed standing up from a chair using your arms (e.g., wheelchair or bedside chair)?: Total Help needed to walk in hospital room?: Total Help needed climbing 3-5 steps with a railing? : Total 6 Click Score: 7    End of Session Equipment Utilized During Treatment: Gait belt Activity Tolerance: Patient tolerated treatment well Patient left: in bed;with call bell/phone within reach;with bed alarm set Nurse Communication: Mobility status;Other (comment) (poor trunk control--not currently safe to leave up in chair) PT Visit Diagnosis: Muscle weakness (generalized) (M62.81);History of falling (Z91.81);Difficulty in walking, not elsewhere classified (R26.2);Other symptoms and signs involving the nervous system (R29.898);Hemiplegia and hemiparesis Hemiplegia - Right/Left: Left Hemiplegia - dominant/non-dominant: Non-dominant    Time: 8856-8786 PT Time Calculation (min) (  ACUTE ONLY): 30 min   Charges:   PT Evaluation $PT Eval Low Complexity: 1 Low   PT General Charges $$ ACUTE PT VISIT: 1 Visit          Lawrence Barnett, PT Acute Rehabilitation Services  Office [phone]   Lawrence Barnett 07/16/2024, 3:03  PM

## 2024-07-16 NOTE — Progress Notes (Signed)
  Inpatient Rehabilitation Admissions Coordinator   Met with patient at bedside for rehab assessment. We briefly discussed goals and expectations of a possible CIR admit. He works outside the home and lives with his nephew, Jamie. I have left a voicemail for his nephew/POA to begin discussions of rehab venue options, caregivers supports and goals for rehab.  Please call me with any questions.   Heron Leavell, RN, MSN Rehab Admissions Coordinator [phone]

## 2024-07-16 NOTE — Progress Notes (Signed)
 Speech Language Pathology Treatment: Dysphagia  Patient Details Name: Lawrence Barnett MRN: [id number] DOB: 01-Nov-1950 Today's Date: 07/16/2024 Time: 1022-1030 SLP Time Calculation (min) (ACUTE ONLY): 8 min  Assessment / Plan / Recommendation Clinical Impression  Pt seen for ongoing dysphagia management.  Pt with cough following initial trial of nectar/mildly thick liquid by serial straw sip.  Cough noted with serial sips on one additional trial as well.  Pt tolerated NTL by single sips and MBSS competed 11/12 is reassuring.  Pt unable to bite through graham cracker.  He exhibited good oral clearance of soft solid texture following prolonged oral phase.  Administer medications as tolerated.  If pt has difficulty with oral transit of medications, consider crushing.  Recommend mechanical soft diet with nectar thick liquids by single sip.    HPI HPI: Lawrence Barnett is a 73 y.o. male who presented with left sided weakness.  Pt found to have RBG/thal ICH. CT 11/12: 1. Slight interval increase in size of right thalamic hemorrhage,  now measuring 3.6 x 2.7 x 2.2 cm (estimated volume 10.5 mL,  previously 7 mL). Mild surrounding edema, slightly increased.  2. Intraventricular extension with blood in both lateral ventricles,  also slightly increased. Stable ventricular size and morphology  without hydrocephalus.  Pt with pmhx HTN.      SLP Plan  Continue with current plan of care          Recommendations  Diet recommendations: Dysphagia 3 (mechanical soft);Nectar-thick liquid Liquids provided via: Cup;Straw Medication Administration:  (No specific precautions, can crush meds if pt has difficulty with oral transit.) Supervision: Full supervision/cueing for compensatory strategies;Staff to assist with self feeding Compensations: Slow rate;Small sips/bites (Single sips of NTL) Postural Changes and/or Swallow Maneuvers: Seated upright 90 degrees                  Oral care BID    None Dysphagia, oropharyngeal phase (R13.12)     Continue with current plan of care     Anette FORBES Grippe, MA, CCC-SLP Acute Rehabilitation Services Office: [phone] 07/16/2024, 11:14 AM

## 2024-07-16 NOTE — Progress Notes (Addendum)
 STROKE TEAM PROGRESS NOTE    SIGNIFICANT HOSPITAL EVENTS 11/11: Patient admitted with right basal ganglia ICH with IVH 11/13: Patient transferred out of the ICU  INTERIM HISTORY/SUBJECTIVE  Patient's blood pressure has been stable off Cleviprex.  He is more alert today and has been placed on a mechanical soft diet with nectar thickened liquids.  He is stable and ready to be transferred out of the ICU.  OBJECTIVE  CBC    Component Value Date/Time   WBC 5.8 07/16/2024 0601   RBC 6.11 (H) 07/16/2024 0601   HGB 16.8 07/16/2024 0601   HGB 16.2 10/07/2012 1618   HCT 50.7 07/16/2024 0601   HCT 47.8 10/07/2012 1618   PLT 226 07/16/2024 0601   PLT 306 10/07/2012 1618   MCV 83.0 07/16/2024 0601   MCV 92 10/07/2012 1618   MCH 27.5 07/16/2024 0601   MCHC 33.1 07/16/2024 0601   RDW 16.1 (H) 07/16/2024 0601   RDW 13.9 10/07/2012 1618   LYMPHSABS 2.1 07/14/2024 1904   MONOABS 0.4 07/14/2024 1904   EOSABS 0.1 07/14/2024 1904   BASOSABS 0.0 07/14/2024 1904    BMET    Component Value Date/Time   NA 142 07/16/2024 0601   NA 139 10/08/2012 0431   K 4.3 07/16/2024 0601   K 4.0 10/08/2012 0431   CL 108 07/16/2024 0601   CL 107 10/08/2012 0431   CO2 24 07/16/2024 0601   CO2 25 10/08/2012 0431   GLUCOSE 97 07/16/2024 0601   GLUCOSE 133 (H) 10/08/2012 0431   BUN 12 07/16/2024 0601   BUN 10 10/08/2012 0431   CREATININE 1.03 07/16/2024 0601   CREATININE 0.83 10/08/2012 0431   CALCIUM 9.9 07/16/2024 0601   CALCIUM 9.4 10/08/2012 0431   GFRNONAA >60 07/16/2024 0601   GFRNONAA >60 10/08/2012 0431    IMAGING past 24 hours MR BRAIN W WO CONTRAST Result Date: 07/16/2024 EXAM: MRI BRAIN WITH AND WITHOUT CONTRAST 07/16/2024 05:28:00 AM TECHNIQUE: Multiplanar multisequence MRI of the head/brain was performed with and without the administration of 6 mL gadobutrol (GADAVIST) 1 MMOL/ML intravenous contrast. COMPARISON: CT of the head dated 07/15/2024. CLINICAL HISTORY: Stroke, hemorrhagic.  FINDINGS: BRAIN AND VENTRICLES: No acute infarct. There is acute interparenchymal hemorrhage again demonstrated within the right thalamus measuring approximately 2.8 x 2.7 x 3.3 cm, similar to the prior study allowing for differences in technique. There is no evidence of underlying mass. There is interventricular extension again demonstrated with blood layering dependently within the posterior horns of the lateral ventricles bilaterally. There is a focal area of hemosiderin staining also present in the left parietal lobe, in the region of focal encephalomalacia demonstrated on the previous CT. No mass effect or midline shift. No hydrocephalus. The sella is unremarkable. Normal flow voids. No abnormal parenchymal or meningeal enhancement. ORBITS: No acute abnormality. SINUSES: There is chronic left maxillary sinus disease with and/or bowing of the medial wall of the left maxillary sinus resulting in occlusion of the left nasal cavity and opacification of the ethmoid air cells and frontal sinuses. BONES AND SOFT TISSUES: Normal bone marrow signal and enhancement. No acute soft tissue abnormality. IMPRESSION: 1. Acute intraparenchymal hemorrhage in the right thalamus, measuring approximately 2.8 x 2.7 x 3.3 cm, with interventricular extension and blood layering dependently within the posterior horns of the lateral ventricles bilaterally, similar to the prior study. 2. No evidence of underlying mass or abnormal parenchymal or meningeal enhancement. 3. Focal area of hemosiderin staining in the left parietal lobe, in the region  of focal encephalomalacia demonstrated on the previous CT. 4. 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 Electronically signed by: Evalene Coho MD 07/16/2024 05:50 AM EST RP Workstation: [id number]    Vitals:   07/16/24 1100  07/16/24 1200 07/16/24 1253 07/16/24 1405  BP: (!) 154/94 (!) 152/109 (!) 174/118 (!) 157/144  Pulse:  94 95 (!) 104  Resp: (!) 21 18 18    Temp:  98.4 F (36.9 C) 98.3 F (36.8 C)   TempSrc:  Axillary Oral   SpO2:  (!) 75%    Weight:      Height:         PHYSICAL EXAM General:  Alert, well-nourished, well-developed elderly patient in no acute distress Psych:  Mood and affect appropriate for situation CV: Regular rate and rhythm on monitor Respiratory:  Regular, unlabored respirations on room air GI: Abdomen soft and nontender   NEURO:  Mental Status: AA&Ox3, patient is not able to give much history Speech/Language: speech is with moderate dysarthria with naming, repetition, and comprehension intact.  Cranial Nerves:  II: PERRL. Visual fields full.  III, IV, VI: Right gaze preference, able to cross midline V: Sensation is intact to light touch and symmetrical to face.  VII: Left facial droop VIII: hearing intact to voice. IX, X: Voice is dysarthric XII: tongue is midline without fasciculations. Motor: Able to follow commands with right upper and lower extremities with antigravity strength, no movement of left upper extremity but is able to flicker left lower extremity to noxious Tone: is normal and bulk is normal Sensation-sensation diminished on the left Coordination: FTN intact on the right, unable to perform on the left Gait- deferred  Most Recent NIH  1a Level of Conscious.: 0 1b LOC Questions: 0 1c LOC Commands: 0 2 Best Gaze: 1 3 Visual: 0 4 Facial Palsy: 1 5a Motor Arm - left: 4 5b Motor Arm - Right: 0 6a Motor Leg - Left: 4 6b Motor Leg - Right: 0 7 Limb Ataxia: 0 8 Sensory: 0 9 Best Language: 0 10 Dysarthria: 2 11 Extinct. and Inatten.: 0 TOTAL: 12   ASSESSMENT/PLAN  Lawrence Barnett is a 73 y.o. male with history of hypertension admitted for left-sided hemiplegia.  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  He did  not pass his swallow study and will have cortrak inserted NIH on Admission 17  ICH:  right ganglia/thalamus ICH with IVH, etiology: Likely hypertensive Code Stroke CT head 7 mm acute IPH centered at right thalamus with IVH, no hydrocephalus CTA head & neck no LVO or hemodynamically significant stenosis, no vascular abnormality underlying right thalamic hemorrhage  Follow-up head CT slight interval increase in size of right thalamic hemorrhage, slightly increased IVH MRI acute right thalamic IPH with IVH, stable, no evidence of underlying mass 2D Echo EF 55-60% LDL 73 HgbA1c 5.6 UDS neg VTE prophylaxis - heparin subq No antithrombotic prior to admission, now on No antithrombotic secondary to IPH Therapy recommendations:  CIR Disposition: Pending  Hypertension Home meds: None BP high on presentation  Now stable on the high end Off Cleviprex On losartan 100 BP goal < 160 Long-term BP goal normotensive  Lipid management Home meds: None LDL 73, goal < 70 Add statin at discharge  Dysphagia Patient has post-stroke dysphagia, SLP consulted  Now on soft  diet with nectar thick liquids On gentle IVF @ 50  Other Stroke Risk Factors Advanced age  Other Active Problems   Hospital day # 2  Patient seen by NP with MD, MD to edit note as needed. Cortney E Everitt Clint Kill , MSN, AGACNP-BC [facility] See Amion for schedule and pager information 07/16/2024 2:26 PM  ATTENDING NOTE: I reviewed above note and agree with the assessment and plan. Pt was seen and examined.   RN is at the bedside. Pt awake, alert, eyes open, orientated to age, place, time. No aphasia, paucity of speech, mild dysarthric, but following all simple commands. Able to name 3/3 and repeat in dysarthric voice. R gaze palsy preference and L gaze incomplete, tracking bilaterally with voice, visual field full, PERRL. L facial droop. Tongue midline. LUE flaccid. LLE flaccid. RUE and RLE 5/5. Sensation loss on  the left, R FTN intact grossly, gait not tested.   For detailed assessment and plan, please refer to above as I have made changes wherever appropriate.   Ary Cummins, MD PhD Stroke Neurology 07/16/2024 6:56 PM  This patient is critically ill due to ICH and IVH, hypertensive emergency and at significant risk of neurological worsening, death form hematoma expansion, obstructive hydrocephalus, cerebral edema, brain herniation. This patient's care requires constant monitoring of vital signs, hemodynamics, respiratory and cardiac monitoring, review of multiple databases, neurological assessment, discussion with family, other specialists and medical decision making of high complexity. I spent 35 minutes of neurocritical care time in the care of this patient.    To contact Stroke Continuity provider, please refer to [email or web address]. After hours, contact General Neurology

## 2024-07-16 NOTE — Evaluation (Signed)
 Occupational Therapy Evaluation Patient Details Name: Lawrence Barnett MRN: [id number] DOB: 1951/03/14 Today's Date: 07/16/2024   History of Present Illness   73 y.o. male presents to [facility] hospital on 07/14/2024 after being found down with L weakness. 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 PMH includes HTN.     Clinical Impressions PTA patient independent, working and driving. Admitted for above and presents with problem list below, including L hemiparesis, impaired balance, decreased activity tolerance, decreased cognition and safety awareness.  Pt requires mod assist +2 for bed mobility, max assist +2 to stand at EOB, and mod to total assist +2 for ADLs.  He is oriented but appears confused, reporting I walked to the window already today and requires redirection for safety throughout session demonstrating poor awareness to deficits. Sitting EOB with L lateral lean, but sways in all directions, and improved statically with R UE support.  Pt needs further visual assessment.  Based on performance today, believe pt will best benefit from continued OT services acutely and after dc at an inpatient setting with >3hrs/day to optimize independence, safety and return to PLOF with ADLs and mobility.     If plan is discharge home, recommend the following:   Two people to help with walking and/or transfers;Two people to help with bathing/dressing/bathroom;Assistance with cooking/housework;Direct supervision/assist for medications management;Direct supervision/assist for financial management;Assist for transportation;Help with stairs or ramp for entrance;Supervision due to cognitive status     Functional Status Assessment   Patient has had a recent decline in their functional status and demonstrates the ability to make significant improvements in function in a reasonable and predictable amount of time.     Equipment Recommendations   Other (comment) (defer)     Recommendations for  Other Services   Rehab consult     Precautions/Restrictions   Precautions Precautions: Fall Recall of Precautions/Restrictions: Impaired Precaution/Restrictions Comments: BP <160 Restrictions Weight Bearing Restrictions Per Provider Order: No     Mobility Bed Mobility Overal bed mobility: Needs Assistance Bed Mobility: Supine to Sit, Sit to Supine     Supine to sit: Mod assist, +2 for physical assistance Sit to supine: Mod assist, +2 for physical assistance   General bed mobility comments: mod assist +2 for L LE, trunk, and scooting hips    Transfers Overall transfer level: Needs assistance Equipment used: 2 person hand held assist Transfers: Sit to/from Stand Sit to Stand: Max assist, +2 physical assistance                  Balance Overall balance assessment: Needs assistance Sitting-balance support: No upper extremity supported, Feet supported Sitting balance-Leahy Scale: Zero Sitting balance - Comments: at best min assist with R UE support on rail/chair arm but without statically max assist with heavy L lateral lean but at times all directions Postural control: Left lateral lean Standing balance support: Bilateral upper extremity supported, During functional activity Standing balance-Leahy Scale: Zero Standing balance comment: max +2                           ADL either performed or assessed with clinical judgement   ADL Overall ADL's : Needs assistance/impaired     Grooming: Moderate assistance;Maximal assistance;Sitting Grooming Details (indicate cue type and reason): sitting supported         Upper Body Dressing : Sitting;Maximal assistance   Lower Body Dressing: Total assistance;+2 for physical assistance;Sit to/from stand  Functional mobility during ADLs: Moderate assistance;Maximal assistance;+2 for physical assistance;Cueing for sequencing;Cueing for safety General ADL Comments: pt limited by impaired balance,  L hemiparesis and decreased cognition     Vision Baseline Vision/History: 1 Wears glasses Vision Assessment?: Vision impaired- to be further tested in functional context Additional Comments: scans to L side with increased time, some decreased attention to L side but further assessment needed     Perception Perception: Impaired Preception Impairment Details: Inattention/Neglect Perception-Other Comments: L   Praxis Praxis: Impaired Praxis Impairment Details: Organization, Motor planning     Pertinent Vitals/Pain Pain Assessment Pain Assessment: No/denies pain     Extremity/Trunk Assessment Upper Extremity Assessment Upper Extremity Assessment: Right hand dominant;LUE deficits/detail LUE Deficits / Details: flaccid, does not withdrawl to noxious stimuli. PROM WFL. LUE Sensation: decreased light touch;decreased proprioception LUE Coordination: decreased fine motor;decreased gross motor   Lower Extremity Assessment Lower Extremity Assessment: Defer to PT evaluation       Communication Communication Communication: Impaired Factors Affecting Communication: Reduced clarity of speech (soft spoken)   Cognition Arousal: Alert Behavior During Therapy: WFL for tasks assessed/performed Cognition: Cognition impaired     Awareness: Online awareness impaired, Intellectual awareness impaired Memory impairment (select all impairments): Working memory Attention impairment (select first level of impairment): Sustained attention Executive functioning impairment (select all impairments): Organization, Sequencing, Reasoning, Problem solving, Initiation OT - Cognition Comments: pt oriented but demonstrates some confusion.  He reports walking to the window this morning and demonstrates fluctuating awareness of deficits/safety.                 Following commands: Impaired Following commands impaired: Follows one step commands with increased time, Follows multi-step commands  inconsistently     Cueing  General Comments   Cueing Techniques: Verbal cues;Visual cues;Tactile cues  VSS on RA   Exercises     Shoulder Instructions      Home Living Family/patient expects to be discharged to:: Private residence Living Arrangements: Alone Available Help at Discharge: Family;Available 24 hours/day Type of Home: House Home Access: Stairs to enter [employer] of Steps: 6 Entrance Stairs-Rails: Right;Left Home Layout: One level     Bathroom Shower/Tub: Chief Strategy Officer: Standard     Home Equipment: Grab bars - toilet;Grab bars - tub/shower          Prior Functioning/Environment Prior Level of Function : Independent/Modified Independent;Driving;Working/employed               ADLs Comments: 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    OT Problem List: Decreased strength;Decreased activity tolerance;Impaired balance (sitting and/or standing);Decreased safety awareness;Decreased knowledge of use of DME or AE;Decreased knowledge of precautions;Decreased range of motion;Impaired vision/perception;Decreased coordination;Decreased cognition;Impaired tone;Impaired sensation;Impaired UE functional use   OT Treatment/Interventions: Self-care/ADL training;Therapeutic exercise;Neuromuscular education;DME and/or AE instruction;Therapeutic activities;Cognitive remediation/compensation;Visual/perceptual remediation/compensation;Patient/family education;Balance training;Splinting;Manual therapy      OT Goals(Current goals can be found in the care plan section)   Acute Rehab OT Goals Patient Stated Goal: get out of here OT Goal Formulation: With patient Time For Goal Achievement: 07/30/24 Potential to Achieve Goals: Fair   OT Frequency:  Min 2X/week    Co-evaluation PT/OT/SLP Co-Evaluation/Treatment: Yes Reason for Co-Treatment: For patient/therapist safety;To address functional/ADL transfers PT goals  addressed during session: Mobility/safety with mobility OT goals addressed during session: ADL's and self-care      AM-PAC OT 6 Clicks Daily Activity     Outcome Measure Help from another person eating meals?: A Lot Help  from another person taking care of personal grooming?: A Lot Help from another person toileting, which includes using toliet, bedpan, or urinal?: Total Help from another person bathing (including washing, rinsing, drying)?: A Lot Help from another person to put on and taking off regular upper body clothing?: A Lot Help from another person to put on and taking off regular lower body clothing?: Total 6 Click Score: 10   End of Session Equipment Utilized During Treatment: Gait belt Nurse Communication: Mobility status;Precautions  Activity Tolerance: Patient tolerated treatment well Patient left: with call bell/phone within reach;in bed;with bed alarm set  OT Visit Diagnosis: Other abnormalities of gait and mobility (R26.89);Muscle weakness (generalized) (M62.81);Hemiplegia and hemiparesis;Other symptoms and signs involving cognitive function Hemiplegia - Right/Left: Left Hemiplegia - dominant/non-dominant: Non-Dominant Hemiplegia - caused by: Nontraumatic intracerebral hemorrhage                Time: 8856-8786 OT Time Calculation (min): 30 min Charges:  OT General Charges $OT Visit: 1 Visit OT Evaluation $OT Eval Moderate Complexity: 1 Mod  Etta NOVAK, OT Acute Rehabilitation Services Office [phone] Secure Chat Preferred    Etta GORMAN Hope 07/16/2024, 1:30 PM

## 2024-07-16 NOTE — Evaluation (Signed)
 Speech Language Pathology Evaluation Patient Details Name: Lawrence Barnett MRN: [id number] DOB: 1951/04/10 Today's Date: 07/16/2024 Time: 8969-8947 SLP Time Calculation (min) (ACUTE ONLY): 22 min  Problem List:  Patient Active Problem List   Diagnosis Date Noted   ICH (intracerebral hemorrhage) (HCC) 07/14/2024   Pneumonia due to COVID-19 virus 07/23/2019   Acute respiratory failure with hypoxia (HCC) 07/22/2019   Proteinuria 07/22/2019   Hematuria 07/22/2019   COVID-19 virus infection 07/17/2019   Metabolic acidosis 07/17/2019   Severe sepsis (HCC) 07/16/2019   Lobar pneumonia 07/16/2019   Transaminitis 07/16/2019   Alcohol abuse 07/16/2019   ATN (acute tubular necrosis) 07/16/2019   Hypokalemia 07/16/2019   Syncope and collapse 07/16/2019   Past Medical History:  Past Medical History:  Diagnosis Date   Hypertension    Past Surgical History: No past surgical history on file. HPI:  Lawrence Barnett is a 73 y.o. male who presented with left sided weakness.  Pt found to have RBG/thal ICH. CT 11/12: 1. Slight interval increase in size of right thalamic hemorrhage,  now measuring 3.6 x 2.7 x 2.2 cm (estimated volume 10.5 mL,  previously 7 mL). Mild surrounding edema, slightly increased.  2. Intraventricular extension with blood in both lateral ventricles,  also slightly increased. Stable ventricular size and morphology  without hydrocephalus.  Pt with pmhx HTN.   Assessment / Plan / Recommendation Clinical Impression  Pt presents with functional expressive and receptive language.  He was assessed using the Western Aphasia Battery - Bedside Form (see below for additional information).  Pt exhibited mild impairment with y/n questions, but this was at least partially 2/2 L inattention.  Pt's spontaneous speech is fluent and grammatically correct.  During picture description task, pt only named items on R side of drawing, but with visual cuing he was able to attend to L.  Pt also  benefited from repositioning at midline in bed to be able to better visualize L side of room.  Pt is able to talk about events leading up to hospitalization with good specificity and about his life.   He appears aware of deficts noting inability to walk 2/2 L weakness, but also states he is ready to get out of the hospital.   He does exhibit mild dysarthria c/b consonant imprecision and low vocal intensity.  Pt states he has always been quiet, but with mild L facial/oral deficits, pt is occasionally unintelligible at sentence level.  He benefits from cutes to speak loudly which improves intelligibility.     Pt would benefit from ST in house to address dysarthria and from further assessment of higher level cognitive function.  Pt appears to be a good candidate for intensive rehab with CIR.  Western Aphasia Battery - Bedside Form Content: 9/10 Fluency: 10/10 Yes/No: 7/10 Sequential Commands: 10/10 Repetition: 9.5/10 Naming: 10/10 Bedside Aphasia Score: 93/100     SLP Assessment  SLP Recommendation/Assessment: Patient needs continued Speech Language Pathology Services SLP Visit Diagnosis: Dysarthria and anarthria (R47.1)     Assistance Recommended at Discharge  Intermittent Supervision/Assistance  Functional Status Assessment Patient has had a recent decline in their functional status and demonstrates the ability to make significant improvements in function in a reasonable and predictable amount of time.  Frequency and Duration min 2x/week  2 weeks      SLP Evaluation Cognition  Overall Cognitive Status:  (Not formally assessed today) Arousal/Alertness: Awake/alert Orientation Level: Oriented X4 Attention:  (Inattention to L visually, able to attend with cues)  Comprehension  Auditory Comprehension Overall Auditory Comprehension: Appears within functional limits for tasks assessed Yes/No Questions: Impaired Basic Biographical Questions: 76-100% accurate Basic Immediate  Environment Questions: 50-74% accurate Complex Questions: 75-100% accurate Commands: Within Functional Limits Conversation: Complex Visual Recognition/Discrimination Discrimination: Not tested Reading Comprehension Reading Status: Not tested    Expression Expression Primary Mode of Expression: Verbal Verbal Expression Overall Verbal Expression: Appears within functional limits for tasks assessed Initiation: No impairment Level of Generative/Spontaneous Verbalization: Conversation Repetition: No impairment Naming: No impairment Pragmatics: Impairment Impairments: Topic maintenance (Low vocal intensity)   Oral / Motor  Oral Motor/Sensory Function Overall Oral Motor/Sensory Function: Mild impairment Facial ROM: Reduced left Facial Symmetry: Abnormal symmetry left Motor Speech Overall Motor Speech: Impaired Respiration: Within functional limits Phonation: Low vocal intensity Resonance: Within functional limits Articulation: Impaired Level of Impairment: Conversation Intelligibility: Intelligibility reduced Sentence: 75-100% accurate Conversation: 75-100% accurate Motor Planning: Within functional limits Motor Speech Errors: Not applicable Effective Techniques: Increased vocal intensity            Anette FORBES Grippe, MA, CCC-SLP Acute Rehabilitation Services Office: [phone] 07/16/2024, 11:31 AM

## 2024-07-16 NOTE — Progress Notes (Addendum)
 Attempted to call pts nephew [name] to make him aware of pts transfer. No answer at this time.  Nephew returned call and made aware of pts transfer.

## 2024-07-16 NOTE — Progress Notes (Signed)
  Inpatient Rehab Admissions Coordinator :  Per therapy recommendations, patient was screened for CIR candidacy by Ottie Glazier RN MSN.  At this time patient appears to be a potential candidate for CIR. I will place a rehab consult per protocol for full assessment. Please call me with any questions.  Ottie Glazier RN MSN Admissions Coordinator [phone]

## 2024-07-17 ENCOUNTER — Inpatient Hospital Stay (HOSPITAL_COMMUNITY)

## 2024-07-17 ENCOUNTER — Encounter (HOSPITAL_COMMUNITY): Payer: Self-pay | Admitting: Neurology

## 2024-07-17 DIAGNOSIS — I10 Essential (primary) hypertension: Secondary | ICD-10-CM | POA: Diagnosis not present

## 2024-07-17 DIAGNOSIS — I610 Nontraumatic intracerebral hemorrhage in hemisphere, subcortical: Secondary | ICD-10-CM | POA: Diagnosis not present

## 2024-07-17 DIAGNOSIS — R29712 NIHSS score 12: Secondary | ICD-10-CM | POA: Diagnosis not present

## 2024-07-17 DIAGNOSIS — R509 Fever, unspecified: Secondary | ICD-10-CM

## 2024-07-17 DIAGNOSIS — I615 Nontraumatic intracerebral hemorrhage, intraventricular: Secondary | ICD-10-CM | POA: Diagnosis not present

## 2024-07-17 LAB — URINALYSIS, ROUTINE W REFLEX MICROSCOPIC
Bilirubin Urine: NEGATIVE
Glucose, UA: NEGATIVE mg/dL
Hgb urine dipstick: NEGATIVE
Ketones, ur: NEGATIVE mg/dL
Leukocytes,Ua: NEGATIVE
Nitrite: NEGATIVE
Protein, ur: NEGATIVE mg/dL
Specific Gravity, Urine: 1.013 (ref 1.005–1.030)
pH: 6 (ref 5.0–8.0)

## 2024-07-17 LAB — CBC
HCT: 50.3 % (ref 39.0–52.0)
Hemoglobin: 16.7 g/dL (ref 13.0–17.0)
MCH: 27.2 pg (ref 26.0–34.0)
MCHC: 33.2 g/dL (ref 30.0–36.0)
MCV: 81.9 fL (ref 80.0–100.0)
Platelets: 216 K/uL (ref 150–400)
RBC: 6.14 MIL/uL — ABNORMAL HIGH (ref 4.22–5.81)
RDW: 16.1 % — ABNORMAL HIGH (ref 11.5–15.5)
WBC: 8.7 K/uL (ref 4.0–10.5)
nRBC: 0 % (ref 0.0–0.2)

## 2024-07-17 LAB — BASIC METABOLIC PANEL WITH GFR
Anion gap: 14 (ref 5–15)
BUN: 15 mg/dL (ref 8–23)
CO2: 20 mmol/L — ABNORMAL LOW (ref 22–32)
Calcium: 9.8 mg/dL (ref 8.9–10.3)
Chloride: 111 mmol/L (ref 98–111)
Creatinine, Ser: 0.94 mg/dL (ref 0.61–1.24)
GFR, Estimated: >60 mL/min (ref >60)
Glucose, Bld: 93 mg/dL (ref 70–99)
Potassium: 4.1 mmol/L (ref 3.5–5.1)
Sodium: 145 mmol/L (ref 135–145)

## 2024-07-17 MED ORDER — FAMOTIDINE 20 MG PO TABS
20.0000 mg | ORAL_TABLET | Freq: Every day | ORAL | Status: DC
  Administered 2024-07-17 – 2024-07-18 (×2): 20 mg via ORAL
  Filled 2024-07-17 (×2): qty 1

## 2024-07-17 MED ORDER — ACETAMINOPHEN 650 MG RE SUPP
650.0000 mg | RECTAL | Status: DC | PRN
  Administered 2024-07-17 – 2024-07-18 (×3): 650 mg via RECTAL
  Filled 2024-07-17 (×3): qty 1

## 2024-07-17 MED ORDER — DEXTROSE-NACL 5-0.9 % IV SOLN: INTRAVENOUS | Status: DC

## 2024-07-17 MED ORDER — HYDRALAZINE HCL 50 MG PO TABS
50.0000 mg | ORAL_TABLET | Freq: Three times a day (TID) | ORAL | Status: DC
  Administered 2024-07-17: 50 mg via ORAL
  Filled 2024-07-17: qty 1

## 2024-07-17 MED ORDER — HYDROCHLOROTHIAZIDE 25 MG PO TABS
25.0000 mg | ORAL_TABLET | Freq: Every day | ORAL | Status: DC
  Administered 2024-07-17 – 2024-07-18 (×2): 25 mg via ORAL
  Filled 2024-07-17 (×2): qty 1

## 2024-07-17 MED ORDER — POLYETHYLENE GLYCOL 3350 17 G PO PACK
17.0000 g | PACK | Freq: Two times a day (BID) | ORAL | Status: DC
  Administered 2024-07-17: 17 g via ORAL
  Filled 2024-07-17: qty 1

## 2024-07-17 MED ORDER — DEXTROSE-SODIUM CHLORIDE 5-0.9 % IV SOLN: INTRAVENOUS | Status: DC

## 2024-07-17 NOTE — Plan of Care (Signed)
  Problem: Education: Goal: Knowledge of disease or condition will improve Outcome: Progressing Goal: Knowledge of patient specific risk factors will improve (DELETE if not current risk factor) Outcome: Progressing   Problem: Coping: Goal: Will verbalize positive feelings about self Outcome: Progressing Goal: Will identify appropriate support needs Outcome: Progressing   Problem: Nutrition: Goal: Dietary intake will improve Outcome: Progressing

## 2024-07-17 NOTE — Progress Notes (Signed)
 PROGRESS NOTE  Lawrence Barnett FMW:[id number] DOB: [birth year] DOA: 07/14/2024 PCP: Vicci Mliss SAUNDERS, FNP   LOS: 3 days   Brief narrative:  Lawrence Barnett is a 73 y.o. male with past medical history of HTN, arthritis presented to the hospital after being found by his nephew lying on the floor with left-sided weakness.  He uses a lot of Advil  at baseline for arthritis and pain.  EMS was called in and patient was brought into the hospital for acute stroke and patient was noted to have blood blood pressure elevated more than 200 systolic.  In the ED, labs were remarkable for mild hypokalemia with potassium of 3.3.  Urine drug screen was negative.  CT head scan was done which showed 7 mL right basal ganglia intracranial hemorrhage with intraventricular extension.  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 Patient was initially admitted to the ICU and repeat scans were done.  Blood pressure control with goal systolic at 130-1 50 and was initially on Cleviprex and labetalol.  Lipid profile with LDL of 73.  Hemoglobin A1c of 5.6.  MRI of the brain showed acute intraparenchymal hemorrhage in the right thalamus measuring 2.8 x 2.7 x 3.3 cm with intraventricular extension.  CT angiogram of the head and neck without large vessel occlusion.  Seen by speech therapy and currently on dysphagia 3 diet.  physical therapy has evaluated the patient and at this time recommendation is CIR.  Hypertension.  Hypertension goal systolic 130-150.  Currently on hydralazine  as needed, labetalol and losartan.  Blood pressure today elevated at 151/108.  Will adjust medications but patient is allergic to metoprolol, amlodipine and lisinopril..  Hypokalemia.  Has resolved.  Check BMP in AM.  Low-grade fever, tachycardia..  Temperature max 100.9  F.  Will continue to monitor close.  Patient on room air.  No leukocytosis.  Will get chest x-ray and urinalysis.  Aspiration precautions.  Superficial rash over the left knee and forearm.  Appears to be irritation related.  Could be secondary to adhesive tape.  Will continue to observe.  No itching.  DVT prophylaxis: heparin injection 5,000 Units Start: 07/16/24 1400 SCD's Start: 07/14/24 1909   Disposition: CIR as per PT evaluation  Status is: Inpatient Remains inpatient appropriate because: Pending clinical improvement, need for CIR,    Code Status:     Code Status: Full Code  Family Communication: Spoke with the patient's niece at bedside.  Patient lives with nephew at home.  Consultants: Neurology  Procedures: None  Anti-infectives:  None yet  Anti-infectives (From admission, onward)    None      Subjective: Today, patient was seen and examined at bedside.  Patient's niece at bedside.  Complains of some red patch over the left upper extremity and left knee.  Objective: Vitals:   07/17/24 0743 07/17/24 0815  BP: (!) 186/118 (!) 151/108  Pulse: (!) 113 (!) 116  Resp: 18   Temp: 99.3 F (37.4 C)   SpO2: 95%     Intake/Output Summary (Last 24 hours) at 07/17/2024 0932 Last data filed at 07/17/2024 0539 Gross per 24 hour  Intake 1048.79 ml  Output 1850 ml  Net -801.21 ml   Filed Weights   07/14/24 1906 07/14/24 1924 07/15/24 0100  Weight: 67.4 kg 67.4 kg 63.5 kg   Body mass index is 18.99 kg/m.  Physical Exam: GENERAL: Patient is alert awake and Communicative.  Not in obvious distress.  Elderly male, Communicative, HENT: No scleral pallor or icterus. Pupils equally reactive to light. Oral mucosa is moist NECK: is supple, no gross swelling noted. CHEST: Clear to auscultation.  Diminished breath sounds bilaterally. CVS: S1 and S2 heard, no murmur. Regular rate and rhythm.  ABDOMEN: Soft, non-tender, bowel sounds are present.  External urinary  catheter EXTREMITIES: No edema. CNS: Follows commands.  Left-sided weakness noted.  Left facial droop. SKIN: warm and dry without rashes.  Mild erythematous area over the left dorsal hand and knee with some blanching but no pruritus.  Data Review: I have personally reviewed the following laboratory data and studies,  CBC: Recent Labs  Lab 07/14/24 1904 07/14/24 1908 07/16/24 0601 07/17/24 0438  WBC 6.2  --  5.8 8.7  NEUTROABS 3.5  --   --   --   HGB 15.3 16.0 16.8 16.7  HCT 46.7 47.0 50.7 50.3  MCV 81.6  --  83.0 81.9  PLT 200  --  226 216   Basic Metabolic Panel: Recent Labs  Lab 07/14/24 1904 07/14/24 1908 07/16/24 0601 07/17/24 0438  NA 141 143 142 145  K 3.5 3.3* 4.3 4.1  CL 105 107 108 111  CO2 22  --  24 20*  GLUCOSE 105* 103* 97 93  BUN 10 11 12 15   CREATININE 0.84 0.80 1.03 0.94  CALCIUM 9.4  --  9.9 9.8   Liver Function Tests: Recent Labs  Lab 07/14/24 1904  AST 29  ALT 18  ALKPHOS 140*  BILITOT 0.8  PROT 8.2*  ALBUMIN 3.6   No results for input(s): LIPASE, AMYLASE in the last 168 hours. No results for input(s): AMMONIA in the last 168 hours. Cardiac Enzymes: No results for input(s): CKTOTAL, CKMB, CKMBINDEX, TROPONINI in the last 168 hours. BNP (last 3 results) No results for input(s): BNP in the last 8760 hours.  ProBNP (last 3 results) No results for input(s): PROBNP in the last 8760 hours.  CBG: Recent Labs  Lab 07/14/24 1903  GLUCAP 112*   Recent Results (from the past 240 hours)  MRSA Next Gen by PCR, Nasal     Status: None   Collection Time: 07/15/24  1:15 AM   Specimen: Nasal Mucosa; Nasal Swab  Result Value Ref Range Status   MRSA by PCR Next Gen NOT DETECTED NOT DETECTED Final    Comment: (NOTE) The GeneXpert MRSA Assay (FDA approved for NASAL specimens only), is one component of a comprehensive MRSA colonization surveillance program. It is not intended to diagnose MRSA infection nor to guide or monitor  treatment for MRSA infections. Test performance is not FDA approved in patients less than 71 years old. Performed at [facility], [address] [place], [place] [address]      Studies: MR BRAIN W WO CONTRAST Result Date: 07/16/2024 EXAM: MRI BRAIN WITH AND WITHOUT CONTRAST 07/16/2024 05:28:00 AM TECHNIQUE: Multiplanar multisequence MRI of the head/brain was performed with and without the administration of 6 mL gadobutrol (GADAVIST) 1 MMOL/ML intravenous contrast. COMPARISON: CT of the head dated 07/15/2024. CLINICAL HISTORY: Stroke, hemorrhagic. FINDINGS: BRAIN AND VENTRICLES: No acute infarct. There is acute interparenchymal hemorrhage again demonstrated within the right thalamus measuring approximately 2.8 x 2.7 x 3.3 cm, similar to the prior study allowing for differences in technique. There is no evidence of underlying mass. There is interventricular extension again demonstrated with blood layering dependently within the posterior horns  of the lateral ventricles bilaterally. There is a focal area of hemosiderin staining also present in the left parietal lobe, in the region of focal encephalomalacia demonstrated on the previous CT. No mass effect or midline shift. No hydrocephalus. The sella is unremarkable. Normal flow voids. No abnormal parenchymal or meningeal enhancement. ORBITS: No acute abnormality. SINUSES: There is chronic left maxillary sinus disease with and/or bowing of the medial wall of the left maxillary sinus resulting in occlusion of the left nasal cavity and opacification of the ethmoid air cells and frontal sinuses. BONES AND SOFT TISSUES: Normal bone marrow signal and enhancement. No acute soft tissue abnormality. IMPRESSION: 1. Acute intraparenchymal hemorrhage in the right thalamus, measuring approximately 2.8 x 2.7 x 3.3 cm, with interventricular extension and blood layering dependently within the posterior horns of the lateral ventricles bilaterally, similar to the prior  study. 2. No evidence of underlying mass or abnormal parenchymal or meningeal enhancement. 3. Focal area of hemosiderin staining in the left parietal lobe, in the region of focal encephalomalacia demonstrated on the previous CT. 4. 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 Electronically signed by: Evalene Coho MD 07/16/2024 05:50 AM EST RP Workstation: [id number]   ECHOCARDIOGRAM COMPLETE Result Date: 07/15/2024    ECHOCARDIOGRAM REPORT   Patient Name:   Lawrence Barnett Date of Exam: 07/15/2024 Medical Rec #:  [id number]       Height:       72.0 in Accession #:    [id number]      Weight:       140.0 lb Date of Birth:  23-Dec-1950       BSA:          1.831 m Patient Age:    73 years        BP:           143/94 mmHg Patient Gender: M               HR:           104 bpm. Exam Location:  Inpatient Procedure: 2D Echo, Cardiac Doppler and Color Doppler (Both Spectral and Color            Flow Doppler were utilized during procedure). Indications:    Stroke  History:        Patient has no prior history of Echocardiogram examinations.                 Stroke; Risk Factors:Hypertension.  Sonographer:    Juliene Rucks Referring Phys: [id number] [facility]  Sonographer Comments: Image acquisition challenging due to patient body habitus. IMPRESSIONS  1. Difficult images, EF appears roughly preserved. Left ventricular ejection fraction, by estimation, is 55 to 60%. The left ventricle has normal function. Left ventricular endocardial border not optimally defined to evaluate regional wall motion. Left ventricular diastolic function could not be evaluated.  2. Right ventricular systolic function was not well visualized. The right ventricular size is not well visualized.  3. The mitral valve is grossly normal. No evidence of mitral valve regurgitation.  4. The  aortic valve is grossly normal. Aortic valve regurgitation is not visualized. FINDINGS  Left Ventricle: Difficult images, EF appears roughly preserved. Left ventricular ejection fraction, by estimation, is 55 to 60%. The left ventricle has normal function. Left ventricular endocardial border not optimally defined to evaluate regional  wall motion. The left ventricular internal cavity size was normal in size. There is no left ventricular hypertrophy. Left ventricular diastolic function could not be evaluated. Right Ventricle: The right ventricular size is not well visualized. Right vetricular wall thickness was not well visualized. Right ventricular systolic function was not well visualized. Left Atrium: Left atrial size was normal in size. Right Atrium: Right atrial size was normal in size. Pericardium: There is no evidence of pericardial effusion. Mitral Valve: The mitral valve is grossly normal. No evidence of mitral valve regurgitation. Tricuspid Valve: The tricuspid valve is grossly normal. Tricuspid valve regurgitation is not demonstrated. Aortic Valve: The aortic valve is grossly normal. Aortic valve regurgitation is not visualized. Pulmonic Valve: The pulmonic valve was not well visualized. Aorta: The aortic root is normal in size and structure. IAS/Shunts: The interatrial septum was not well visualized.  LEFT VENTRICLE PLAX 2D LVIDd:         4.50 cm   Diastology LVIDs:         3.00 cm   LV e' medial:    2.94 cm/s LV PW:         1.00 cm   LV E/e' medial:  25.8 LV IVS:        1.00 cm   LV e' lateral:   7.29 cm/s LVOT diam:     2.20 cm   LV E/e' lateral: 10.4 LV SV:         40 LV SV Index:   22 LVOT Area:     3.80 cm  RIGHT VENTRICLE          IVC RV Basal diam:  3.30 cm  IVC diam: 1.00 cm RV Mid diam:    2.70 cm LEFT ATRIUM             Index        RIGHT ATRIUM           Index LA diam:        2.60 cm 1.42 cm/m   RA Area:     16.90 cm LA Vol (A2C):   38.1 ml 20.81 ml/m  RA Volume:   46.30 ml  25.29 ml/m LA  Vol (A4C):   29.0 ml 15.84 ml/m LA Biplane Vol: 35.3 ml 19.28 ml/m  AORTIC VALVE LVOT Vmax:   80.60 cm/s LVOT Vmean:  50.600 cm/s LVOT VTI:    0.105 m  AORTA Ao Root diam: 3.50 cm MITRAL VALVE               TRICUSPID VALVE MV Area (PHT): 6.32 cm    TR Peak grad:   14.6 mmHg MV Decel Time: 120 msec    TR Vmax:        191.00 cm/s MV E velocity: 75.80 cm/s MV A velocity: 42.80 cm/s  SHUNTS MV E/A ratio:  1.77        Systemic VTI:  0.10 m                            Systemic Diam: 2.20 cm Morene Brownie Electronically signed by Morene Brownie Signature Date/Time: 07/15/2024/4:44:42 PM    Final    DG Swallowing Func-Speech Pathology Result Date: 07/15/2024 Table formatting from the original result was not included. Modified Barium Swallow Study Patient Details Name: TUKKER BYRNS MRN: [id number] Date of Birth: [birth year] Today's Date: 07/15/2024 HPI/PMH: HPI: 73 yo male with history of HTN who presents to ED 11/11 with L  sided weakness. 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 MRI pending. Clinical Impression: Pt exhibits a primary oral dysphagia with mild pharyngeal deficits. He holds boluses within his oral cavity for prolonged periods of time and initiates a swallow only when it reaches his pyriform sinuses. Despite complete laryngeal elevation, this mistiming allows penetration during the swallow with thin liquids that eventually progresses below the vocal folds (PAS 7). Although oral holding occurs with all consistencies, the bolus is better controlled as it enters the pharynx with nectar thick liquids, preventing frank penetration and aspiration. Trace superficial penetrates are expelled with a volitional throat clear (PAS 3). Oral transit became increasingly disorganized with solids, which may also be affected by missing dentition. Purees and solids resulted in increased residue along the BOT, valleculae, and posterior pharyngeal wall, with minimally improved clearance using a nectar thick liquid wash. He  was unable to swallow the 13 mm barium tablet with nectar thick liquids, necessitating expectoration. Question his ability to meet nutritional needs given the extent of oral holding observed. Can offer nectar thick liquids from floor stock with full supervision to monitor for oral clearance. Otherwise, recommend he remain NPO with consideration of short-term enteral nutrition. Factors that may increase risk of adverse event in presence of aspiration Noe & Lianne 2021): Factors that may increase risk of adverse event in presence of aspiration Noe & Lianne 2021): Reduced cognitive function; Limited mobility; Frail or deconditioned Recommendations/Plan: Swallowing Evaluation Recommendations Swallowing Evaluation Recommendations Recommendations: NPO (x nectar thick liquids from floorstock) Medication Administration: Via alternative means Supervision: Full assist for feeding; Full supervision/cueing for swallowing strategies Swallowing strategies  : Check for pocketing or oral holding Postural changes: Position pt fully upright for meals; Stay upright 30-60 min after meals Oral care recommendations: Oral care QID (4x/day); Oral care before ice chips/water; Staff/trained caregiver to provide oral care Caregiver Recommendations: Avoid jello, ice cream, thin soups, popsicles; Remove water pitcher; Have oral suction available Treatment Plan Treatment Plan Treatment recommendations: Therapy as outlined in treatment plan below Functional status assessment: Patient has had a recent decline in their functional status and demonstrates the ability to make significant improvements in function in a reasonable and predictable amount of time. Treatment frequency: Min 2x/week Treatment duration: 2 weeks Interventions: Aspiration precaution training; Compensatory techniques; Patient/family education; Trials of upgraded texture/liquids Recommendations Recommendations for follow up therapy are one component of a  multi-disciplinary discharge planning process, led by the attending physician.  Recommendations may be updated based on patient status, additional functional criteria and insurance authorization. Assessment: Orofacial Exam: Orofacial Exam Oral Cavity: Oral Hygiene: WFL Oral Cavity - Dentition: Missing dentition Orofacial Anatomy: WFL Oral Motor/Sensory Function: Suspected cranial nerve impairment CN V - Trigeminal: WFL CN VII - Facial: Left motor impairment CN IX - Glossopharyngeal, CN X - Vagus: WFL CN XII - Hypoglossal: Left motor impairment Anatomy: Anatomy: Suspected cervical osteophytes Boluses Administered: Boluses Administered Boluses Administered: Thin liquids (Level 0); Mildly thick liquids (Level 2, nectar thick); Moderately thick liquids (Level 3, honey thick); Puree; Solid  Oral Impairment Domain: Oral Impairment Domain Lip Closure: Escape beyond mid-chin Tongue control during bolus hold: Cohesive bolus between tongue to palatal seal Bolus preparation/mastication: Slow prolonged chewing/mashing with complete recollection Bolus transport/lingual motion: Delayed initiation of tongue motion (oral holding) Oral residue: Majority of bolus remaining Location of oral residue : Tongue; Palate; Floor of mouth Initiation of pharyngeal swallow : Pyriform sinuses  Pharyngeal Impairment Domain: Pharyngeal Impairment Domain Soft palate elevation: No bolus between soft palate (SP)/pharyngeal  wall (PW) Laryngeal elevation: Complete superior movement of thyroid cartilage with complete approximation of arytenoids to epiglottic petiole Anterior hyoid excursion: Complete anterior movement Epiglottic movement: Complete inversion Laryngeal vestibule closure: Complete, no air/contrast in laryngeal vestibule Pharyngeal stripping wave : Present - complete Pharyngeal contraction (A/P view only): N/A Pharyngoesophageal segment opening: Complete distension and complete duration, no obstruction of flow Tongue base retraction: No  contrast between tongue base and posterior pharyngeal wall (PPW) Pharyngeal residue: Collection of residue within or on pharyngeal structures Location of pharyngeal residue: Tongue base; Valleculae; Pyriform sinuses  Esophageal Impairment Domain: No data recorded Pill: Pill Consistency administered: Mildly thick liquids (Level 2, nectar thick) Mildly thick liquids (Level 2, nectar thick): Impaired (see clinical impressions) Penetration/Aspiration Scale Score: Penetration/Aspiration Scale Score 1.  Material does not enter airway: Mildly thick liquids (Level 2, nectar thick); Moderately thick liquids (Level 3, honey thick); Puree; Solid 7.  Material enters airway, passes BELOW cords and not ejected out despite cough attempt by patient: Thin liquids (Level 0) Compensatory Strategies: Compensatory Strategies Compensatory strategies: No   General Information: Caregiver present: Yes  Diet Prior to this Study: NPO   Temperature : Normal   Respiratory Status: WFL   Supplemental O2: Nasal cannula   History of Recent Intubation: No  Behavior/Cognition: Alert; Cooperative; Distractible; Requires cueing Self-Feeding Abilities: Needs assist with self-feeding Baseline vocal quality/speech: Normal Volitional Cough: Able to elicit Volitional Swallow: Able to elicit Exam Limitations: Poor positioning Goal Planning: Prognosis for improved oropharyngeal function: Good Barriers to Reach Goals: Cognitive deficits No data recorded Patient/Family Stated Goal: resume POs Consulted and agree with results and recommendations: Patient; Advanced practice provider; Nurse Pain: Pain Assessment Pain Assessment: No/denies pain End of Session: Start Time:SLP Start Time (ACUTE ONLY): 1400 Stop Time: SLP Stop Time (ACUTE ONLY): 1430 Time Calculation:SLP Time Calculation (min) (ACUTE ONLY): 30 min Charges: SLP Evaluations $ SLP Speech Visit: 1 Visit SLP Evaluations $BSS Swallow: 1 Procedure $MBS Swallow: 1 Procedure SLP visit diagnosis: SLP Visit  Diagnosis: Dysphagia, oropharyngeal phase (R13.12) Past Medical History: Past Medical History: Diagnosis Date  Hypertension  Past Surgical History: No past surgical history on file. Damien Blumenthal, M.A., CCC-SLP Speech Language Pathology, Acute Rehabilitation Services Secure Chat preferred [phone] 07/15/2024, 4:07 PM     Vernal Alstrom, MD  [facility] 07/17/2024  If 7PM-7AM, please contact night-coverage

## 2024-07-17 NOTE — Progress Notes (Addendum)
 STROKE TEAM PROGRESS NOTE    SIGNIFICANT HOSPITAL EVENTS 11/11: Patient admitted with right basal ganglia ICH with IVH 11/13: Patient transferred out of the ICU  INTERIM HISTORY/SUBJECTIVE  BP elevated, PO meds increased.  He is more alert today and has been placed on a mechanical soft diet with nectar thickened liquids.  Neuro exam stable. No family at the bedside.   OBJECTIVE  CBC    Component Value Date/Time   WBC 8.7 07/17/2024 0438   RBC 6.14 (H) 07/17/2024 0438   HGB 16.7 07/17/2024 0438   HGB 16.2 10/07/2012 1618   HCT 50.3 07/17/2024 0438   HCT 47.8 10/07/2012 1618   PLT 216 07/17/2024 0438   PLT 306 10/07/2012 1618   MCV 81.9 07/17/2024 0438   MCV 92 10/07/2012 1618   MCH 27.2 07/17/2024 0438   MCHC 33.2 07/17/2024 0438   RDW 16.1 (H) 07/17/2024 0438   RDW 13.9 10/07/2012 1618   LYMPHSABS 2.1 07/14/2024 1904   MONOABS 0.4 07/14/2024 1904   EOSABS 0.1 07/14/2024 1904   BASOSABS 0.0 07/14/2024 1904    BMET    Component Value Date/Time   NA 145 07/17/2024 0438   NA 139 10/08/2012 0431   K 4.1 07/17/2024 0438   K 4.0 10/08/2012 0431   CL 111 07/17/2024 0438   CL 107 10/08/2012 0431   CO2 20 (L) 07/17/2024 0438   CO2 25 10/08/2012 0431   GLUCOSE 93 07/17/2024 0438   GLUCOSE 133 (H) 10/08/2012 0431   BUN 15 07/17/2024 0438   BUN 10 10/08/2012 0431   CREATININE 0.94 07/17/2024 0438   CREATININE 0.83 10/08/2012 0431   CALCIUM 9.8 07/17/2024 0438   CALCIUM 9.4 10/08/2012 0431   GFRNONAA >60 07/17/2024 0438   GFRNONAA >60 10/08/2012 0431    IMAGING past 24 hours No results found.   Vitals:   07/17/24 0743 07/17/24 0815 07/17/24 1034 07/17/24 1129  BP: (!) 186/118 (!) 151/108 (!) 164/107 120/83  Pulse: (!) 113 (!) 116  94  Resp: 18   20  Temp: 99.3 F (37.4 C)   98.2 F (36.8 C)  TempSrc: Oral   Oral  SpO2: 95%   97%  Weight:      Height:         PHYSICAL EXAM General:  Alert, well-nourished, well-developed elderly patient in no acute  distress Psych:  Mood and affect appropriate for situation CV: Regular rate and rhythm on monitor Respiratory:  Regular, unlabored respirations on room air GI: Abdomen soft and nontender   NEURO:  Mental Status: AA&Ox3, patient is not able to give much history Speech/Language: speech is with moderate dysarthria with naming, repetition, and comprehension intact.  Cranial Nerves:  II: PERRL. Visual fields full.  III, IV, VI: Right gaze preference, able to cross midline V: Sensation is intact to light touch and symmetrical to face.  VII: Left facial droop VIII: hearing intact to voice. IX, X: Voice is dysarthric XII: tongue is midline without fasciculations. Motor: Able to follow commands with right upper and lower extremities with antigravity strength, no movement of left upper extremity but is able to flicker left lower extremity to noxious Tone: is normal and bulk is normal Sensation-sensation diminished on the left Coordination: FTN intact on the right, unable to perform on the left Gait- deferred  Most Recent NIH  1a Level of Conscious.: 0 1b LOC Questions: 0 1c LOC Commands: 0 2 Best Gaze: 1 3 Visual: 0 4 Facial Palsy: 1 5a Motor Arm -  left: 4 5b Motor Arm - Right: 0 6a Motor Leg - Left: 4 6b Motor Leg - Right: 0 7 Limb Ataxia: 0 8 Sensory: 0 9 Best Language: 0 10 Dysarthria: 2 11 Extinct. and Inatten.: 0 TOTAL: 12   ASSESSMENT/PLAN  [name] is a 73 y.o. male with history of hypertension admitted for left-sided hemiplegia.  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  He did not pass his swallow study and will have cortrak inserted NIH on Admission 17  ICH:  right ganglia/thalamus ICH with IVH, etiology: Likely hypertensive Code Stroke CT head 7 mm acute IPH centered at right thalamus with IVH, no hydrocephalus CTA head & neck no LVO or hemodynamically significant stenosis, no vascular abnormality underlying right thalamic  hemorrhage  Follow-up head CT slight interval increase in size of right thalamic hemorrhage, slightly increased IVH MRI acute right thalamic IPH with IVH, stable, no evidence of underlying mass 2D Echo EF 55-60% LDL 73 HgbA1c 5.6 UDS neg VTE prophylaxis - heparin subq No antithrombotic prior to admission, now on No antithrombotic secondary to IPH Therapy recommendations:  CIR Disposition: Pending  Hypertension Home meds: None BP high on presentation  Now stable on the high end On losartan 100, hydralazine  50mg  TID, hydrochlorothiazide 25mg  daily BP goal < 160 Long-term BP goal normotensive  Lipid management Home meds: None LDL 73, goal < 70 Add statin at discharge  Fever ?  Aspiration Tmax 102.5 with lethargy Concern for aspiration CXR negative Keep NPO and close monitoring Consider empiric antibiotic if needed  Dysphagia Patient has post-stroke dysphagia, SLP consulted  Was on soft diet with nectar thick liquids, now n.p.o. due to concern of aspiration Continue IVF @ 50  Other Stroke Risk Factors Advanced age  Other Active Problems   Hospital day # 3  Patient seen and examined by NP/APP with MD. MD to update note as needed.   Jorene Last, DNP, FNP-BC [facility] Pager: [phone]  ATTENDING NOTE: I reviewed above note and agree with the assessment and plan. Pt was seen and examined.   No family at bedside.  Patient lying bed, lethargic, able to open eyes on voice, still orientated x 3 but answer question with soft voice and delayed response.  Still has left-sided hemiplegia but developed fever and concerning for aspiration.  Change diet to n.p.o., continue IV fluid.  BP on the higher end, add HCTZ.  Will consider empiric antibiotics if needed.  Discussed with Dr. Sonjia.  For detailed assessment and plan, please refer to above as I have made changes wherever appropriate.   Ary Cummins, MD PhD Stroke Neurology 07/17/2024 7:08  PM  Patient developed lethargy and fever, concerning for aspiration. I discussed with Dr. Sonjia. I spent extensive total face-to-face time with the patient and family, reviewing test results, images and medication, and discussing the diagnosis, treatment plan and potential prognosis. This patient's care requiresreview of multiple databases, neurological assessment, discussion with family, other specialists and medical decision making of high complexity.      To contact Stroke Continuity provider, please refer to [email or web address]. After hours, contact General Neurology

## 2024-07-17 NOTE — NC FL2 (Signed)
 [place]  MEDICAID FL2 LEVEL OF CARE FORM     IDENTIFICATION  Patient Name: Lawrence Barnett Birthdate: 05-07-1951 Sex: male Admission Date (Current Location): 07/14/2024  [facility] and [place] Number:  Chiropodist and Address:  The . [facility], [address] [place], [place] [address]      Provider Number: [id number]  Attending Physician Name and Address:  Sonjia Held, MD  Relative Name and Phone Number:       Current Level of Care: Hospital Recommended Level of Care: Skilled Nursing Facility Prior Approval Number:    Date Approved/Denied:   PASRR Number: [id number]  Discharge Plan: SNF    Current Diagnoses: Patient Active Problem List   Diagnosis Date Noted   ICH (intracerebral hemorrhage) (HCC) 07/14/2024   Pneumonia due to COVID-19 virus 07/23/2019   Acute respiratory failure with hypoxia (HCC) 07/22/2019   Proteinuria 07/22/2019   Hematuria 07/22/2019   COVID-19 virus infection 07/17/2019   Metabolic acidosis 07/17/2019   Severe sepsis (HCC) 07/16/2019   Lobar pneumonia 07/16/2019   Transaminitis 07/16/2019   Alcohol abuse 07/16/2019   ATN (acute tubular necrosis) 07/16/2019   Hypokalemia 07/16/2019   Syncope and collapse 07/16/2019    Orientation RESPIRATION BLADDER Height & Weight     Self, Situation, Place  Normal Incontinent Weight: 139 lb 15.9 oz (63.5 kg) Height:  6' (182.9 cm)  BEHAVIORAL SYMPTOMS/MOOD NEUROLOGICAL BOWEL NUTRITION STATUS      Incontinent Diet (see DC summary)  AMBULATORY STATUS COMMUNICATION OF NEEDS Skin   Extensive Assist Verbally Normal                       Personal Care Assistance Level of Assistance  Bathing, Feeding, Dressing Bathing Assistance: Maximum assistance Feeding assistance: Maximum assistance Dressing Assistance: Maximum assistance     Functional Limitations Info  Speech     Speech Info: Impaired (dysarthria)    SPECIAL CARE FACTORS FREQUENCY  PT (By licensed  PT), OT (By licensed OT), Speech therapy     PT Frequency: 5x/wk OT Frequency: 5x/wk     Speech Therapy Frequency: 5x/wk      Contractures Contractures Info: Not present    Additional Factors Info  Code Status, Allergies Code Status Info: Full Allergies Info: Clonidine , Penicillins, Amlodipine, Lisinopril, Metoprolol, Tylenol  (Acetaminophen )           Current Medications (07/17/2024):  This is the current hospital active medication list Current Facility-Administered Medications  Medication Dose Route Frequency Provider Last Rate Last Admin   0.9 %  sodium chloride  infusion   Intravenous Continuous Jerri Pfeiffer, MD 50 mL/hr at 07/17/24 1056 New Bag at 07/17/24 1056   famotidine  (PEPCID ) tablet 20 mg  20 mg Oral Daily Pokhrel, Laxman, MD   20 mg at 07/17/24 1034   heparin injection 5,000 Units  5,000 Units Subcutaneous Q8H Xu, Jindong, MD   5,000 Units at 07/17/24 1440   hydrALAZINE  (APRESOLINE ) injection 10 mg  10 mg Intravenous Q4H PRN de Clint Kill, Cortney E, NP   10 mg at 07/16/24 1539   hydrALAZINE  (APRESOLINE ) tablet 50 mg  50 mg Oral Q8H Jerri Pfeiffer, MD   50 mg at 07/17/24 1034   hydrochlorothiazide (HYDRODIURIL) tablet 25 mg  25 mg Oral Daily Pokhrel, Laxman, MD   25 mg at 07/17/24 1034   labetalol (NORMODYNE) injection 10 mg  10 mg Intravenous Q2H PRN de Clint Kill, Cortney E, NP   10 mg at 07/17/24 1416  losartan (COZAAR) tablet 100 mg  100 mg Oral Daily Jerri Pfeiffer, MD   100 mg at 07/17/24 9183   Oral care mouth rinse  15 mL Mouth Rinse PRN Dagenhart, Jamie H, NP       polyethylene glycol (MIRALAX / GLYCOLAX) packet 17 g  17 g Oral BID Pokhrel, Laxman, MD   17 g at 07/17/24 0816   senna-docusate (Senokot-S) tablet 1 tablet  1 tablet Oral BID Khaliqdina, Salman, MD   1 tablet at 07/17/24 9182     Discharge Medications: Please see discharge summary for a list of discharge medications.  Relevant Imaging Results:  Relevant Lab Results:   Additional Information SS#:  [id number]  [name], LCSW

## 2024-07-17 NOTE — Care Management Important Message (Signed)
 Important Message  Patient Details  Name: Lawrence Barnett MRN: [id number] Date of Birth: 07/12/1951   Important Message Given:  Yes - Medicare IM     Claretta Deed 07/17/2024, 3:15 PM

## 2024-07-17 NOTE — Progress Notes (Signed)
 Physical Therapy Treatment Patient Details Name: Lawrence Barnett MRN: [id number] DOB: Jan 30, 1951 Today's Date: 07/17/2024   History of Present Illness 73 y.o. male presents to [facility] hospital on 07/14/2024 after being found down with L weakness. 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 PMH includes HTN.    PT Comments  Pt received in supine and agreeable to session. Pt appears confused and demonstrates decreased awareness of deficits, stating he had just stood up on his own prior to therapist arrival. Pt demonstrates L inattention and weakness requiring increased cues and assist. Pt demonstrates no active LLE movement, however does not completely buckle in stance. Pt unable to attempt steps despite assist with weight shifting and L knee blocked. Attempted RLE LAQ, but pt unable to perform. Pt demonstrates poor balance requiring consistent assist to prevent LOB. Pt continues to benefit from PT services to progress toward functional mobility goals.    If plan is discharge home, recommend the following: Two people to help with walking and/or transfers;Assistance with feeding;Direct supervision/assist for medications management;Assist for transportation;Direct supervision/assist for financial management;Help with stairs or ramp for entrance;Supervision due to cognitive status   Can travel by private vehicle        Equipment Recommendations  Wheelchair (measurements PT);Wheelchair cushion (measurements PT);Hospital bed    Recommendations for Other Services       Precautions / Restrictions Precautions Precautions: Fall Recall of Precautions/Restrictions: Impaired Precaution/Restrictions Comments: BP <160 Restrictions Weight Bearing Restrictions Per Provider Order: No     Mobility  Bed Mobility Overal bed mobility: Needs Assistance Bed Mobility: Supine to Sit, Sit to Supine     Supine to sit: Mod assist, +2 for physical assistance Sit to supine: Mod assist, +2 for physical  assistance   General bed mobility comments: mod assist +2 for L LE, trunk, and scooting hips    Transfers Overall transfer level: Needs assistance Equipment used: 2 person hand held assist Transfers: Sit to/from Stand Sit to Stand: Max assist, +2 physical assistance           General transfer comment: STS from EOB with L knee blocked and assist for power up using bedpad and facilitation of upright posture with pt maintaining trunk flexion    Ambulation/Gait               General Gait Details: unable   Stairs             Wheelchair Mobility     Tilt Bed    Modified Rankin (Stroke Patients Only) Modified Rankin (Stroke Patients Only) Pre-Morbid Rankin Score: No symptoms Modified Rankin: Severe disability     Balance Overall balance assessment: Needs assistance Sitting-balance support: Feet supported, Single extremity supported, No upper extremity supported Sitting balance-Leahy Scale: Zero Sitting balance - Comments: L lateral lean that increases with fatigue. Anterior and L lateral LOB requiring assist to correct. RUE support on bedrail most of the time. Pt unable to perform reaching task without trunk support   Standing balance support: Bilateral upper extremity supported, During functional activity Standing balance-Leahy Scale: Zero Standing balance comment: max +2                            Communication Communication Communication: Impaired Factors Affecting Communication: Reduced clarity of speech  Cognition Arousal: Alert Behavior During Therapy: WFL for tasks assessed/performed   PT - Cognitive impairments: Awareness, Initiation, Problem solving, Sequencing  PT - Cognition Comments: Decreased awareness of deficits and L inattention noted. Increased time for processing Following commands: Impaired Following commands impaired: Follows one step commands with increased time, Follows multi-step commands  inconsistently    Cueing Cueing Techniques: Verbal cues, Visual cues, Tactile cues  Exercises Other Exercises Other Exercises: RUE reaching with assist for trunk support    General Comments        Pertinent Vitals/Pain Pain Assessment Pain Assessment: No/denies pain     PT Goals (current goals can now be found in the care plan section) Acute Rehab PT Goals Patient Stated Goal: return to work PT Goal Formulation: With patient Time For Goal Achievement: 07/30/24 Progress towards PT goals: Progressing toward goals    Frequency    Min 3X/week       AM-PAC PT 6 Clicks Mobility   Outcome Measure  Help needed turning from your back to your side while in a flat bed without using bedrails?: A Lot Help needed moving from lying on your back to sitting on the side of a flat bed without using bedrails?: Total Help needed moving to and from a bed to a chair (including a wheelchair)?: Total Help needed standing up from a chair using your arms (e.g., wheelchair or bedside chair)?: Total Help needed to walk in hospital room?: Total Help needed climbing 3-5 steps with a railing? : Total 6 Click Score: 7    End of Session Equipment Utilized During Treatment: Gait belt Activity Tolerance: Patient tolerated treatment well Patient left: in bed;with call bell/phone within reach;with bed alarm set Nurse Communication: Mobility status PT Visit Diagnosis: Muscle weakness (generalized) (M62.81);History of falling (Z91.81);Difficulty in walking, not elsewhere classified (R26.2);Other symptoms and signs involving the nervous system (R29.898);Hemiplegia and hemiparesis Hemiplegia - Right/Left: Left Hemiplegia - dominant/non-dominant: Non-dominant     Time: 8898-8878 PT Time Calculation (min) (ACUTE ONLY): 20 min  Charges:    $Therapeutic Activity: 8-22 mins PT General Charges $$ ACUTE PT VISIT: 1 Visit                    Darryle George, PTA Acute Rehabilitation Services Secure  Chat Preferred  [phone]    Darryle George 07/17/2024, 12:41 PM

## 2024-07-17 NOTE — Progress Notes (Signed)
 Speech Language Pathology Treatment: Dysphagia;Cognitive-Linguistic  Patient Details Name: Lawrence Barnett MRN: [id number] DOB: 1951/07/25 Today's Date: 07/17/2024 Time: 8850-8791 SLP Time Calculation (min) (ACUTE ONLY): 19 min  Assessment / Plan / Recommendation Clinical Impression  Pt seen for dysphagia and brief tx for dysarthria. Pt was lethargic and needed significant cues to arouse and continued with drowsiness throughout session (no documentation of lethargy in prior ST sessions and suspect situational) stating he did not sleep much last night. Indications of decreased secretion management with suspected invasion into laryngeal vestibule/airway as he exhibited wet vocal quality and frequent throat clearing prior to having po's. His volitional cough was weak, unable to expectorate and cleared wet quality briefly. Consumed nectar thick juice via straw and bites of Dys 3 texture (small pieces peaches) with continued intermittent immediate throat clearing and one delayed cough. Mastication was mild-moderately prolonged but able to clear oral cavity. Encouraged him to listen for wet quality to his voice and produce strong throat clears or coughs. Performance today may be primarily related to his lethargy. Updated RN and advised to hold meal/lunch until he is more alert. ST will continue to see for safety and efficiency.  Needed reminder to recall strategy to increase vocal intensity when speaking and able imitate in sentences. Educated to increase/exaggerate labial movements during speech. At end of session he recalled 1 of 2 strategies. Did not engage pt in diagnostic assessment of cognition due to lethargy.    HPI HPI: Lawrence Barnett is a 73 y.o. male who presented with left sided weakness.  Pt found to have RBG/thal ICH. CT 11/12: 1. Slight interval increase in size of right thalamic hemorrhage,  now measuring 3.6 x 2.7 x 2.2 cm (estimated volume 10.5 mL,  previously 7 mL). Mild surrounding edema,  slightly increased.  2. Intraventricular extension with blood in both lateral ventricles,  also slightly increased. Stable ventricular size and morphology  without hydrocephalus.  Pt with pmhx HTN.      SLP Plan  Continue with current plan of care          Recommendations  Diet recommendations: Dysphagia 3 (mechanical soft);Nectar-thick liquid Liquids provided via: Cup;Straw Medication Administration: Crushed with puree Supervision: Full supervision/cueing for compensatory strategies;Staff to assist with self feeding Compensations: Slow rate;Small sips/bites Postural Changes and/or Swallow Maneuvers: Seated upright 90 degrees                  Oral care BID   Intermittent Supervision/Assistance Dysphagia, oropharyngeal phase (R13.12);Dysarthria and anarthria (R47.1)     Continue with current plan of care     Dustin Olam Bull  07/17/2024, 1:14 PM

## 2024-07-17 NOTE — Progress Notes (Signed)
 Occupational Therapy Treatment Patient Details Name: Lawrence Barnett MRN: [id number] DOB: 1951-07-23 Today's Date: 07/17/2024   History of present illness 73 y.o. male [facility] on 07/14/2024 after being found down with L weakness. 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 PMH includes HTN.   OT comments  Pt greeted in supine, RN and NT at bedside. Pt tachy in 110s with elevated temp. RN ok for OT proceed, planning to give tylenol , thinks pt aspirated. Pt in NAD upon OT entry. Pt presenting very lethargic, wakefulness briefly improved with HOB elevation and multisensory stim. Oriented x4. OT focused on LUE and L trunk ROM as detailed below. Turned to R side with bony prominences propped with pillows for pressure relief. Pt remains with strong R gaze pref, R head turn preference, L neglect. LUE remains 0/5 strength, although pt able to discriminate L arm when prompted. Will continue to follow.      If plan is discharge home, recommend the following:  Two people to help with walking and/or transfers;Two people to help with bathing/dressing/bathroom;Assistance with cooking/housework;Direct supervision/assist for medications management;Direct supervision/assist for financial management;Assist for transportation;Help with stairs or ramp for entrance;Supervision due to cognitive status   Equipment Recommendations  Other (comment) (defer)    Recommendations for Other Services      Precautions / Restrictions Precautions Precautions: Fall Recall of Precautions/Restrictions: Impaired Precaution/Restrictions Comments: SBP <160 Restrictions Weight Bearing Restrictions Per Provider Order: No       Mobility Bed Mobility Overal bed mobility: Needs Assistance Bed Mobility: Rolling Rolling: Mod assist, Used rails         General bed mobility comments: cues to use rails to assist with rolling to R side for repositioning and pressure relief    Transfers                          Balance                                           ADL either performed or assessed with clinical judgement   ADL                                              Extremity/Trunk Assessment Upper Extremity Assessment LUE Deficits / Details: 0/5 to MMT, no withdrawal to noxious stim; pt able to state L arm when asked which arm OT was touching LUE Sensation: decreased light touch;decreased proprioception LUE Coordination: decreased fine motor;decreased gross motor            Vision   Additional Comments: difficult to further assess today 2/2 lethargy   Perception Perception Perception: Impaired Preception Impairment Details: Inattention/Neglect Perception-Other Comments: L neglect, R gaze pref and R head turn pref   Praxis     Communication Communication Communication: Impaired Factors Affecting Communication: Reduced clarity of speech   Cognition Arousal: Lethargic (drowsy) Behavior During Therapy: Flat affect Cognition: Cognition impaired     Awareness: Intellectual awareness impaired Memory impairment (select all impairments): Working memory Attention impairment (select first level of impairment): Sustained attention Executive functioning impairment (select all impairments): Organization, Sequencing, Reasoning, Problem solving, Initiation OT - Cognition Comments: pt very lethargic, RN reporting pt with high temp and elevated HR,  planning to give tylenol , RN stated she thinks pt aspirated                 Following commands: Impaired Following commands impaired: Follows one step commands with increased time, Follows one step commands inconsistently      Cueing   Cueing Techniques: Verbal cues, Visual cues, Tactile cues  Exercises Exercises: Other exercises Other Exercises Other Exercises: scapular elevation/depression 1x10 Other Exercises: trunk lateral flexion in anti-gravity plane 1x5 Other Exercises:  PROM LUE functional movement pattern of grooming/feeding 1x10    Shoulder Instructions       General Comments HR tachy in 120s, SpO2 96% on RA, temp 102.5* F - RN aware, planning to give tylenol     Pertinent Vitals/ Pain       Pain Assessment Pain Assessment: Faces Faces Pain Scale: No hurt  Home Living                                          Prior Functioning/Environment              Frequency  Min 2X/week        Progress Toward Goals  OT Goals(current goals can now be found in the care plan section)  Progress towards OT goals: Progressing toward goals (slow)     Plan      Co-evaluation                 AM-PAC OT 6 Clicks Daily Activity     Outcome Measure   Help from another person eating meals?: Total Help from another person taking care of personal grooming?: A Lot Help from another person toileting, which includes using toliet, bedpan, or urinal?: Total Help from another person bathing (including washing, rinsing, drying)?: A Lot Help from another person to put on and taking off regular upper body clothing?: A Lot Help from another person to put on and taking off regular lower body clothing?: Total 6 Click Score: 9    End of Session    OT Visit Diagnosis: Other abnormalities of gait and mobility (R26.89);Muscle weakness (generalized) (M62.81);Hemiplegia and hemiparesis;Other symptoms and signs involving cognitive function Hemiplegia - Right/Left: Left Hemiplegia - dominant/non-dominant: Non-Dominant Hemiplegia - caused by: Nontraumatic intracerebral hemorrhage   Activity Tolerance Patient limited by lethargy   Patient Left with call bell/phone within reach;in bed;with bed alarm set   Nurse Communication Other (comment) (pressure relief and pt status)        Time: 8454-8396 OT Time Calculation (min): 18 min  Charges: OT General Charges $OT Visit: 1 Visit OT Treatments $Therapeutic Exercise: 8-22 mins  Lawrence Barnett  M. Lawrence Barnett, OTR/L [facility] Acute Rehabilitation Services [phone] Secure Chat Preferred  Lawrence Barnett 07/17/2024, 4:59 PM

## 2024-07-17 NOTE — Hospital Course (Addendum)
 Lawrence Barnett is a 73 y.o. male with past medical history of HTN, arthritis presented to the hospital after being found by his nephew lying on the floor with left-sided weakness.  He uses a lot of Advil  at baseline for arthritis and pain.  EMS was called in and patient was brought into the hospital for acute stroke and patient was noted to have blood blood pressure elevated more than 200 systolic.  In the ED, labs were remarkable for mild hypokalemia with potassium of 3.3.  Urine drug screen was negative.  CT head scan was done which showed 7 mL right basal ganglia intracranial hemorrhage with intraventricular extension.  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 Patient was initially admitted to the ICU and repeat scans were done.  Blood pressure control with goal systolic at 130-1 50 and was initially on Cleviprex and labetalol.  Lipid profile with LDL of 73.  Hemoglobin A1c of 5.6.  MRI of the brain showed acute intraparenchymal hemorrhage in the right thalamus measuring 2.8 x 2.7 x 3.3 cm with intraventricular extension.  CT angiogram of the head and neck without large vessel occlusion.  Seen by speech therapy and currently on dysphagia 3 diet.  physical therapy has evaluated the patient and at this time recommendation is CIR.  Hypertension.  Hypertension goal systolic 130-150.  Currently on hydralazine  as needed, labetalol and losartan.  Hypokalemia.  Has resolved.

## 2024-07-17 NOTE — Plan of Care (Signed)
  Problem: Education: Goal: Knowledge of disease or condition will improve Outcome: Progressing Goal: Knowledge of patient specific risk factors will improve (DELETE if not current risk factor) Outcome: Progressing   Problem: Coping: Goal: Will identify appropriate support needs Outcome: Progressing

## 2024-07-17 NOTE — Progress Notes (Signed)
  Inpatient Rehabilitation Admissions Coordinator   I spoke with patient's nephew, Jamie/POA by phone. I reviewed goals and expectations of a possible Cir admit vs SNF depending on caregiver supports and patient progress. Nephew prefers to pursue SNF. I will alert acute team and TOC . We will sign off.  Heron Leavell, RN, MSN Rehab Admissions Coordinator [phone] 07/17/2024 1:05 PM

## 2024-07-17 NOTE — Progress Notes (Addendum)
  Inpatient Rehabilitation Admissions Coordinator   I have left aother message for his nephew/POA, Jamie, to call me to discuss  rehab venue options.   Heron Leavell, RN, MSN Rehab Admissions Coordinator [phone] 07/17/2024 12:29 PM

## 2024-07-17 NOTE — TOC Initial Note (Signed)
 Transition of Care [facility]) - Initial/Assessment Note    Patient Details  Name: Lawrence Barnett MRN: [id number] Date of Birth: [birth year]  [facility]) CM/SW Contact:    Lawrence CHRISTELLA Goodie, LCSW Phone Number: 07/17/2024, 3:14 PM  Clinical Narrative:     CSW updated by Rehab Admissions that patient's family requesting SNF at discharge. Referral complete, faxed out. CSW to present bed offers to nephew when received. CSW to follow.              Expected Discharge Plan: Skilled Nursing Facility Barriers to Discharge: Continued Medical Work up   Patient Goals and CMS Choice Patient states their goals for this hospitalization and ongoing recovery are:: patient unable to participate in goal setting, not fully oriented CMS Medicare.gov Compare Post Acute Care list provided to:: Patient Represenative (must comment) Choice offered to / list presented to :  Lawrence Barnett) Lawrence Barnett ownership interest in [facility].provided to::  Lawrence Barnett)    Expected Discharge Plan and Services     Post Acute Care Choice: Skilled Nursing Facility Living arrangements for the past 2 months: Single Family Home                                      Prior Living Arrangements/Services Living arrangements for the past 2 months: Single Family Home Lives with:: Relatives Patient language and need for interpreter reviewed:: No Do you feel safe going back to the place where you live?: Yes      Need for Family Participation in Patient Care: Yes (Comment) Care giver support system in place?: No (comment)   Criminal Activity/Legal Involvement Pertinent to Current Situation/Hospitalization: No - Comment as needed  Activities of Daily Living      Permission Sought/Granted Permission sought to share information with : Facility Medical Sales Representative, Family Supports Permission granted to share information with : Yes, Verbal Permission Granted  Share Information with NAME:  Lawrence Barnett  Permission granted to share info w AGENCY: SNF  Permission granted to share info w Relationship: Nephew     Emotional Assessment   Attitude/Demeanor/Rapport: Unable to Assess Affect (typically observed): Unable to Assess Orientation: : Oriented to Self, Oriented to Place, Oriented to Situation Alcohol / Substance Use: Not Applicable Psych Involvement: No (comment)  Admission diagnosis:  Brain bleed (HCC) [I61.9] ICH (intracerebral hemorrhage) (HCC) [I61.9] Left-sided weakness [R53.1] Cerebrovascular accident (CVA), unspecified mechanism (HCC) [I63.9] Hypertension, unspecified type [I10] Patient Active Problem List   Diagnosis Date Noted   ICH (intracerebral hemorrhage) (HCC) 07/14/2024   Pneumonia due to COVID-19 virus 07/23/2019   Acute respiratory failure with hypoxia (HCC) 07/22/2019   Proteinuria 07/22/2019   Hematuria 07/22/2019   COVID-19 virus infection 07/17/2019   Metabolic acidosis 07/17/2019   Severe sepsis (HCC) 07/16/2019   Lobar pneumonia 07/16/2019   Transaminitis 07/16/2019   Alcohol abuse 07/16/2019   ATN (acute tubular necrosis) 07/16/2019   Hypokalemia 07/16/2019   Syncope and collapse 07/16/2019   PCP:  Lawrence Mliss SAUNDERS, Lawrence Barnett Pharmacy:   [facility] [address]), [place] - [address] Lawrence Barnett [place]) [place] [address] Phone: [phone] Fax: [phone]     Social Drivers of Health (SDOH) Social History: SDOH Screenings   Tobacco Use: Medium Risk (07/21/2021)   Received from [facility]   SDOH Interventions:     Readmission Risk Interventions     No data to  display

## 2024-07-18 ENCOUNTER — Inpatient Hospital Stay (HOSPITAL_COMMUNITY)

## 2024-07-18 DIAGNOSIS — I615 Nontraumatic intracerebral hemorrhage, intraventricular: Secondary | ICD-10-CM | POA: Diagnosis not present

## 2024-07-18 DIAGNOSIS — I610 Nontraumatic intracerebral hemorrhage in hemisphere, subcortical: Secondary | ICD-10-CM | POA: Diagnosis not present

## 2024-07-18 DIAGNOSIS — E870 Hyperosmolality and hypernatremia: Secondary | ICD-10-CM

## 2024-07-18 DIAGNOSIS — N179 Acute kidney failure, unspecified: Secondary | ICD-10-CM | POA: Diagnosis not present

## 2024-07-18 DIAGNOSIS — R29712 NIHSS score 12: Secondary | ICD-10-CM | POA: Diagnosis not present

## 2024-07-18 LAB — CBC
HCT: 53.7 % — ABNORMAL HIGH (ref 39.0–52.0)
HCT: 54.1 % — ABNORMAL HIGH (ref 39.0–52.0)
Hemoglobin: 17.7 g/dL — ABNORMAL HIGH (ref 13.0–17.0)
Hemoglobin: 17.2 g/dL — ABNORMAL HIGH (ref 13.0–17.0)
MCH: 27.5 pg (ref 26.0–34.0)
MCH: 26.8 pg (ref 26.0–34.0)
MCHC: 33 g/dL (ref 30.0–36.0)
MCHC: 31.8 g/dL (ref 30.0–36.0)
MCV: 83.5 fL (ref 80.0–100.0)
MCV: 84.4 fL (ref 80.0–100.0)
Platelets: 202 K/uL (ref 150–400)
Platelets: 238 K/uL (ref 150–400)
RBC: 6.43 MIL/uL — ABNORMAL HIGH (ref 4.22–5.81)
RBC: 6.41 MIL/uL — ABNORMAL HIGH (ref 4.22–5.81)
RDW: 17.3 % — ABNORMAL HIGH (ref 11.5–15.5)
RDW: 16.3 % — ABNORMAL HIGH (ref 11.5–15.5)
WBC: 10.3 K/uL (ref 4.0–10.5)
WBC: 11.8 K/uL — ABNORMAL HIGH (ref 4.0–10.5)
nRBC: 0 % (ref 0.0–0.2)
nRBC: 0 % (ref 0.0–0.2)

## 2024-07-18 LAB — BLOOD GAS, ARTERIAL
Acid-base deficit: 0.8 mmol/L (ref 0.0–2.0)
Bicarbonate: 21.1 mmol/L (ref 20.0–28.0)
O2 Saturation: 98.3 %
Patient temperature: 39.6
pCO2 arterial: 30 mmHg — ABNORMAL LOW (ref 32–48)
pH, Arterial: 7.46 — ABNORMAL HIGH (ref 7.35–7.45)
pO2, Arterial: 101 mmHg (ref 83–108)

## 2024-07-18 LAB — GLUCOSE, CAPILLARY
Glucose-Capillary: 54 mg/dL — ABNORMAL LOW (ref 70–99)
Glucose-Capillary: 139 mg/dL — ABNORMAL HIGH (ref 70–99)
Glucose-Capillary: 98 mg/dL (ref 70–99)

## 2024-07-18 LAB — COMPREHENSIVE METABOLIC PANEL WITH GFR
ALT: 39 U/L (ref 0–44)
AST: 87 U/L — ABNORMAL HIGH (ref 15–41)
Albumin: 3 g/dL — ABNORMAL LOW (ref 3.5–5.0)
Alkaline Phosphatase: 95 U/L (ref 38–126)
Anion gap: 15 (ref 5–15)
BUN: 36 mg/dL — ABNORMAL HIGH (ref 8–23)
CO2: 18 mmol/L — ABNORMAL LOW (ref 22–32)
Calcium: 9.8 mg/dL (ref 8.9–10.3)
Chloride: 119 mmol/L — ABNORMAL HIGH (ref 98–111)
Creatinine, Ser: 1.35 mg/dL — ABNORMAL HIGH (ref 0.61–1.24)
GFR, Estimated: 55 mL/min — ABNORMAL LOW (ref >60)
Glucose, Bld: 99 mg/dL (ref 70–99)
Potassium: 4 mmol/L (ref 3.5–5.1)
Sodium: 152 mmol/L — ABNORMAL HIGH (ref 135–145)
Total Bilirubin: 3.3 mg/dL — ABNORMAL HIGH (ref 0.0–1.2)
Total Protein: 8.5 g/dL — ABNORMAL HIGH (ref 6.5–8.1)

## 2024-07-18 LAB — BASIC METABOLIC PANEL WITH GFR
Anion gap: 16 — ABNORMAL HIGH (ref 5–15)
BUN: 24 mg/dL — ABNORMAL HIGH (ref 8–23)
CO2: 17 mmol/L — ABNORMAL LOW (ref 22–32)
Calcium: 9.8 mg/dL (ref 8.9–10.3)
Chloride: 115 mmol/L — ABNORMAL HIGH (ref 98–111)
Creatinine, Ser: 0.99 mg/dL (ref 0.61–1.24)
GFR, Estimated: >60 mL/min (ref >60)
Glucose, Bld: 92 mg/dL (ref 70–99)
Potassium: 4.1 mmol/L (ref 3.5–5.1)
Sodium: 148 mmol/L — ABNORMAL HIGH (ref 135–145)

## 2024-07-18 LAB — LACTIC ACID, PLASMA: Lactic Acid, Venous: 3.9 mmol/L (ref 0.5–1.9)

## 2024-07-18 LAB — PROCALCITONIN: Procalcitonin: 3.34 ng/mL

## 2024-07-18 MED ORDER — VANCOMYCIN VARIABLE DOSE PER UNSTABLE RENAL FUNCTION (PHARMACIST DOSING): Status: DC

## 2024-07-18 MED ORDER — LACTATED RINGERS IV BOLUS (SEPSIS)
1000.0000 mL | Freq: Once | INTRAVENOUS | Status: AC
  Administered 2024-07-18: 1000 mL via INTRAVENOUS

## 2024-07-18 MED ORDER — SENNOSIDES-DOCUSATE SODIUM 8.6-50 MG PO TABS
1.0000 | ORAL_TABLET | Freq: Two times a day (BID) | ORAL | Status: DC
  Filled 2024-07-18: qty 1

## 2024-07-18 MED ORDER — LABETALOL HCL 5 MG/ML IV SOLN
10.0000 mg | INTRAVENOUS | Status: DC | PRN
  Administered 2024-07-18 – 2024-07-28 (×8): 10 mg via INTRAVENOUS
  Filled 2024-07-18 (×8): qty 4

## 2024-07-18 MED ORDER — METRONIDAZOLE 500 MG PO TABS
500.0000 mg | ORAL_TABLET | Freq: Two times a day (BID) | ORAL | Status: DC
  Administered 2024-07-18 – 2024-07-19 (×3): 500 mg
  Filled 2024-07-18 (×4): qty 1

## 2024-07-18 MED ORDER — HYDRALAZINE HCL 50 MG PO TABS
50.0000 mg | ORAL_TABLET | Freq: Three times a day (TID) | ORAL | Status: DC
  Administered 2024-07-19 (×2): 50 mg
  Filled 2024-07-18 (×2): qty 1

## 2024-07-18 MED ORDER — DEXTROSE 50 % IV SOLN
25.0000 g | INTRAVENOUS | Status: AC
  Administered 2024-07-18: 25 g via INTRAVENOUS
  Filled 2024-07-18: qty 50

## 2024-07-18 MED ORDER — SODIUM CHLORIDE 0.9 % IV SOLN
2.0000 g | Freq: Two times a day (BID) | INTRAVENOUS | Status: DC
  Administered 2024-07-18 – 2024-07-19 (×3): 2 g via INTRAVENOUS
  Filled 2024-07-18 (×3): qty 12.5

## 2024-07-18 MED ORDER — DEXTROSE 5 % IV SOLN: INTRAVENOUS | Status: AC

## 2024-07-18 MED ORDER — VANCOMYCIN HCL 1250 MG/250ML IV SOLN
1250.0000 mg | Freq: Once | INTRAVENOUS | Status: AC
  Administered 2024-07-19: 1250 mg via INTRAVENOUS
  Filled 2024-07-18: qty 250

## 2024-07-18 MED ORDER — FAMOTIDINE 20 MG PO TABS
20.0000 mg | ORAL_TABLET | Freq: Every day | ORAL | Status: DC
  Administered 2024-07-19 – 2024-07-29 (×11): 20 mg
  Filled 2024-07-18 (×11): qty 1

## 2024-07-18 MED ORDER — POLYETHYLENE GLYCOL 3350 17 G PO PACK
17.0000 g | PACK | Freq: Two times a day (BID) | ORAL | Status: DC
  Filled 2024-07-18: qty 1

## 2024-07-18 MED ORDER — SODIUM CHLORIDE 0.9 % IV SOLN
2.0000 g | INTRAVENOUS | Status: DC
  Administered 2024-07-18: 2 g via INTRAVENOUS
  Filled 2024-07-18: qty 20

## 2024-07-18 MED ORDER — METRONIDAZOLE 500 MG PO TABS
500.0000 mg | ORAL_TABLET | Freq: Two times a day (BID) | ORAL | Status: DC
  Administered 2024-07-18: 500 mg via ORAL
  Filled 2024-07-18 (×2): qty 1

## 2024-07-18 MED ORDER — LOSARTAN POTASSIUM 50 MG PO TABS
100.0000 mg | ORAL_TABLET | Freq: Every day | ORAL | Status: DC
  Administered 2024-07-19: 100 mg
  Filled 2024-07-18: qty 2

## 2024-07-18 NOTE — Progress Notes (Signed)
 Pharmacy Antibiotic Note  Lawrence Barnett is a 73 y.o. male admitted on 07/14/2024 with aspiration pneumonia.  Pharmacy has been consulted for Unasyn dosing.  Patient has a listed allergy to penicillins resulting in shortness of breath as a teenager. It appears that the patient has not had any other beta lactams other than cefepime  in 2020. At this time, MD okay to proceed with ceftriaxone and Flagyl in the setting of aspiration pneumonia.  Plan: Start ceftriaxone 2 grams IV Q24h Start metronidazole 500 mg IV Q12h Monitor cultures as avaliable, clinical status, renal function Narrow abx as able and f/u duration    Height: 6' (182.9 cm) Weight: 60.2 kg (132 lb 11.5 oz) IBW/kg (Calculated) : 77.6  Temp (24hrs), Avg:99.5 F (37.5 C), Min:97.3 F (36.3 C), Max:102.5 F (39.2 C)  Recent Labs  Lab 07/14/24 1904 07/14/24 1908 07/16/24 0601 07/17/24 0438 07/18/24 0450  WBC 6.2  --  5.8 8.7 10.3  CREATININE 0.84 0.80 1.03 0.94 0.99    Estimated Creatinine Clearance: 54.9 mL/min (by C-G formula based on SCr of 0.99 mg/dL).    Allergies  Allergen Reactions   Clonidine  Other (See Comments)    Weakness, lower extremity paralysis feeling   Penicillins Shortness Of Breath    Patient reports reaction occurred as a teenager and he experienced shortness of breath not requiring hospitalization.    Amlodipine Swelling   Lisinopril Swelling   Metoprolol Swelling   Tylenol  [Acetaminophen ]     Microbiology results: 07/15/24 MRSA PCR: negative  Thank you for allowing pharmacy to be a part of this patient's care.  Lawrence Barnett, PharmD PGY-1 Pharmacy Resident [facility] 07/18/2024 8:32 AM

## 2024-07-18 NOTE — Significant Event (Addendum)
 Rapid Response Event Note   Reason for Call :  Tachycardia, tachypnea, fever  Initial Focused Assessment:  Pt lying in bed with eyes closed. His breathing is fast. He is  only responsive to painful stimuli in his R leg. Pupils 2 and equal. Pt has a moderate gag with deep oral suctioning. Moderate amount thick tan secretions suctioned out. Lungs diminished t/o. ABD soft. Skin hot to touch/dry.  T-102.6, HR-125, BP-110/78, RR-40, SpO2-995 on RA  Interventions:  Deep oral suctioning Tylenol  rectally CBG-54>D50 CMP CBC/LA Pct PCXR BC x 2 Plan of Care:  Pt is currently protecting airway. Allow tylenol  time to work>then reassess VS and mental status. Await lab/xray results. Continue to monitor pt closely. Please call RRT if further assistance needed.   Event Summary:   MD Notified: Dr. Charlton notified by bedside RN.  Call Time:1933 Arrival Time:1940 End Time:2000   2100-Update: Pt now following simple commands and able to mouth his name. T-100.1, HR-121, BP-102/71, RR-28, SpO2-98% on RA. PCXR-no acute findings.   2230-Update: T up to 103.2. Pt minimally responsive. LA-3.9  Interventions: 2L LR Maxipime harrel ABG-7.46/30/102/21.1 CT head Repeat CBG-98 D5W @100cc /hr d/t Na-152 Ammonia/TSH  Plan: Give fluid.  Await CT and lab results. Continue to monitor closely. Call RRT if needed.   Tish Graeme Piety, RN

## 2024-07-18 NOTE — Progress Notes (Addendum)
 Patient was seen for fevers, tachycardia, and tachypnea.   He was admitted for ICH and is being treated with Rocephin and Flagyl for possible aspiration pneumonia.   Urinalysis was normal and CXR clear yesterday.   Tonight, blood cultures were collected, CMP, CBC, lactate, and procalcitonin sent, and CXR repeated.   He has new mild leukocytosis, sodium 152, lactate 3.9, procalcitonin 3.34, and no acute findings on CXR.   On exam, he is obtunded, tachycardic, and tachypneic with normal BP, no rhonchi or wheezes, soft abdomen, and warm extremities.    Plan to broaden antibiotics, give fluid bolus and change maintenance IVF to D5W, check ABG, and check head CT given his acute decline in mental status.

## 2024-07-18 NOTE — Progress Notes (Signed)
   07/18/24 1541  Vitals  Temp (!) 100.7 F (38.2 C)  Temp Source Oral  BP (!) 131/95  MAP (mmHg) 106  BP Location Right Arm  BP Method Automatic  Patient Position (if appropriate) Lying  Pulse Rate (!) 137  Pulse Rate Source Monitor  ECG Heart Rate (!) 137  Resp (!) 22  MEWS COLOR  MEWS Score Color Red  Oxygen Therapy  SpO2 96 %  O2 Device Room Air  MEWS Score  MEWS Temp 1  MEWS Systolic 0  MEWS Pulse 3  MEWS RR 1  MEWS LOC 0  MEWS Score 5    Notified Dr. Sonjia and Dr. Jerri of Red MEWS and patients continued elevated heart rate multiple times today via secure chat and rounds.

## 2024-07-18 NOTE — Plan of Care (Signed)
  Problem: Education: Goal: Knowledge of disease or condition will improve Outcome: Progressing   Problem: Intracerebral Hemorrhage Tissue Perfusion: Goal: Complications of Intracerebral Hemorrhage will be minimized Outcome: Progressing   Problem: Self-Care: Goal: Ability to participate in self-care as condition permits will improve Outcome: Not Progressing   Problem: Nutrition: Goal: Risk of aspiration will decrease Outcome: Not Progressing Goal: Dietary intake will improve Outcome: Not Progressing   Problem: Clinical Measurements: Goal: Will remain free from infection Outcome: Not Progressing

## 2024-07-18 NOTE — Plan of Care (Signed)
  Problem: Education: Goal: Knowledge of disease or condition will improve Outcome: Progressing Goal: Knowledge of patient specific risk factors will improve (DELETE if not current risk factor) Outcome: Progressing   Problem: Self-Care: Goal: Ability to communicate needs accurately will improve Outcome: Progressing

## 2024-07-18 NOTE — Progress Notes (Addendum)
 STROKE TEAM PROGRESS NOTE    SIGNIFICANT HOSPITAL EVENTS 11/11: Patient admitted with right basal ganglia ICH with IVH 11/13: Patient transferred out of the ICU  INTERIM HISTORY/SUBJECTIVE Less alert today, NG placed due to lethargy. Still answering questions appropriately, oriented. No family at the bedside. Tachycardic, but afebrile.   OBJECTIVE  CBC    Component Value Date/Time   WBC 10.3 07/18/2024 0450   RBC 6.43 (H) 07/18/2024 0450   HGB 17.7 (H) 07/18/2024 0450   HGB 16.2 10/07/2012 1618   HCT 53.7 (H) 07/18/2024 0450   HCT 47.8 10/07/2012 1618   PLT 202 07/18/2024 0450   PLT 306 10/07/2012 1618   MCV 83.5 07/18/2024 0450   MCV 92 10/07/2012 1618   MCH 27.5 07/18/2024 0450   MCHC 33.0 07/18/2024 0450   RDW 17.3 (H) 07/18/2024 0450   RDW 13.9 10/07/2012 1618   LYMPHSABS 2.1 07/14/2024 1904   MONOABS 0.4 07/14/2024 1904   EOSABS 0.1 07/14/2024 1904   BASOSABS 0.0 07/14/2024 1904    BMET    Component Value Date/Time   NA 148 (H) 07/18/2024 0450   NA 139 10/08/2012 0431   K 4.1 07/18/2024 0450   K 4.0 10/08/2012 0431   CL 115 (H) 07/18/2024 0450   CL 107 10/08/2012 0431   CO2 17 (L) 07/18/2024 0450   CO2 25 10/08/2012 0431   GLUCOSE 92 07/18/2024 0450   GLUCOSE 133 (H) 10/08/2012 0431   BUN 24 (H) 07/18/2024 0450   BUN 10 10/08/2012 0431   CREATININE 0.99 07/18/2024 0450   CREATININE 0.83 10/08/2012 0431   CALCIUM 9.8 07/18/2024 0450   CALCIUM 9.4 10/08/2012 0431   GFRNONAA >60 07/18/2024 0450   GFRNONAA >60 10/08/2012 0431    IMAGING past 24 hours DG CHEST PORT 1 VIEW Result Date: 07/17/2024 EXAM: 1 VIEW(S) XRAY OF THE CHEST 07/17/2024 10:00:00 AM COMPARISON: 07/16/2019 CLINICAL HISTORY: Fever FINDINGS: LUNGS AND PLEURA: No focal pulmonary opacity. No pleural effusion. No pneumothorax. HEART AND MEDIASTINUM: No acute abnormality of the cardiac and mediastinal silhouettes. BONES AND SOFT TISSUES: No acute osseous abnormality. IMPRESSION: 1. No acute  cardiopulmonary process. Electronically signed by: Ryan Chess MD 07/17/2024 02:38 PM EST RP Workstation: [id number]     Vitals:   07/18/24 0353 07/18/24 0500 07/18/24 0556 07/18/24 0749  BP: 139/89  110/78 (!) 116/90  Pulse: (!) 121  (!) 111 (!) 117  Resp: 18   17  Temp: (!) 102 F (38.9 C)  98.3 F (36.8 C) 97.7 F (36.5 C)  TempSrc: Oral  Oral Oral  SpO2: 99%  99% 100%  Weight:  60.2 kg    Height:         PHYSICAL EXAM General:  Alert, well-nourished, well-developed elderly patient in no acute distress Psych:  Mood and affect appropriate for situation CV: Regular rate and rhythm on monitor Respiratory:  Regular, unlabored respirations on room air GI: Abdomen soft and nontender   NEURO:  Mental Status: AA&Ox3, patient is not able to give much history Speech/Language: speech is with moderate dysarthria with naming, repetition, and comprehension intact.  Cranial Nerves:  II: PERRL. Visual fields full.  III, IV, VI: Right gaze preference, able to cross midline V: Sensation is intact to light touch and symmetrical to face.  VII: Left facial droop VIII: hearing intact to voice. IX, X: Voice is dysarthric XII: tongue is midline without fasciculations. Motor: Able to follow commands with right upper and lower extremities with antigravity strength, no movement of left  upper extremity but is able to flicker left lower extremity to noxious Tone: is normal and bulk is normal Sensation-sensation diminished on the left Coordination: FTN intact on the right, unable to perform on the left Gait- deferred  Most Recent NIH  1a Level of Conscious.: 0 1b LOC Questions: 0 1c LOC Commands: 0 2 Best Gaze: 1 3 Visual: 0 4 Facial Palsy: 1 5a Motor Arm - left: 4 5b Motor Arm - Right: 0 6a Motor Leg - Left: 4 6b Motor Leg - Right: 0 7 Limb Ataxia: 0 8 Sensory: 0 9 Best Language: 0 10 Dysarthria: 2 11 Extinct. and Inatten.: 0 TOTAL: 12   ASSESSMENT/PLAN  [name] is a 73 y.o. male with history of hypertension admitted for left-sided hemiplegia.  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  He did not pass his swallow study and will have cortrak inserted NIH on Admission 17  ICH:  right ganglia/thalamus ICH with IVH, etiology: Likely hypertensive Code Stroke CT head 7 mm acute IPH centered at right thalamus with IVH, no hydrocephalus CTA head & neck no LVO or hemodynamically significant stenosis, no vascular abnormality underlying right thalamic hemorrhage  Follow-up head CT slight interval increase in size of right thalamic hemorrhage, slightly increased IVH MRI acute right thalamic IPH with IVH, stable, no evidence of underlying mass 2D Echo EF 55-60% LDL 73 HgbA1c 5.6 UDS neg VTE prophylaxis - heparin subq No antithrombotic prior to admission, now on No antithrombotic secondary to IPH Therapy recommendations:  CIR Disposition: Pending  Hypertension Home meds: None BP stable Now stable on the high end On losartan 100, hydralazine  50mg  TID BP goal < 160 Long-term BP goal normotensive  Lipid management Home meds: None LDL 73, goal < 70 Add statin at discharge  Fever with tachycardia ?  Aspiration Tmax 102.6 with lethargy Concern for aspiration CXR negative Keep NPO and close monitoring On mpiric antibiotic with rocephin and flagyl   AKI Hypernatremia Na 145--148--152 Cre 0.84-0.99-1.35 On IVF  Given bolus\ Close monitoring  Dysphagia Patient has post-stroke dysphagia, SLP consulted  Was on soft diet with nectar thick liquids, now n.p.o. due to concern of aspiration Continue IVF   Other Stroke Risk Factors Advanced age   Hospital day # 4  Patient seen and examined by NP/APP with MD. MD to update note as needed.   Lawrence Last, DNP, FNP-BC [facility] Pager: [phone]  ATTENDING NOTE: I reviewed above note and agree with the assessment and plan. Pt was seen and  examined.   No family at the bedside. Pt lethargic, but able to open eyes on voice and still orientated but soft voice and slow response. Still has left hemiplegia. Still has spiking fever and tachycardia, developed AKI and hypernatremia. BP on the low end, stopped hydrochlorothiazide, given IVF and bolus. Tachycardia likely due to fever, now on empiric abx. Management per primary team. Will follow  For detailed assessment and plan, please refer to above as I have made changes wherever appropriate.   Ary Cummins, MD PhD Stroke Neurology 07/18/2024 10:31 PM  Patient condition continued to be guarded with spiking fever, tachycardia, AKI, hypernatremia and lethargy. I discussed with Dr. Sonjia. I spent extensive total face-to-face time with the patient and family, reviewing test results, images and medication, and discussing the diagnosis, treatment plan and potential prognosis. This patient's care requiresreview of multiple databases, neurological assessment, discussion with family, other specialists and medical decision making of high  complexity.      To contact Stroke Continuity provider, please refer to [email or web address]. After hours, contact General Neurology

## 2024-07-18 NOTE — Progress Notes (Signed)
 Pharmacy Antibiotic Note  Lawrence Barnett is a 73 y.o. male admitted on 07/14/2024 with aspiration pneumonia.  Pharmacy has been consulted for Unasyn dosing.  Patient has a listed allergy to penicillins resulting in shortness of breath as a teenager. It appears that the patient has not had any other beta lactams other than cefepime  in 2020. At this time, MD okay to proceed with ceftriaxone and Flagyl in the setting of aspiration pneumonia.  Started on CRO/Flagyl earlier today with concern for aspiration PNA.  MRSA PCR negative.  Now s/p rapid response (cBG 54 s/p D50, tachypneic) with concern for sepsis. Tm 102.46F, WBC 11.8, LA 3.9, PCT 3.34  SCr 0.99 > 1.35, UOP redcued (2.2L yesterday, 700 cc charted today)  Plan: Flagyl IV 500 mg Q12h START Vancomycin  1250 mg x1, then variable dosing based on renal function START Cefepime  2g IV q12h Monitor cultures as avaliable, clinical status, renal function Narrow abx as able and f/u duration    Height: 6' (182.9 cm) Weight: 60.2 kg (132 lb 11.5 oz) IBW/kg (Calculated) : 77.6  Temp (24hrs), Avg:99.5 F (37.5 C), Min:97.3 F (36.3 C), Max:102.6 F (39.2 C)  Recent Labs  Lab 07/14/24 1904 07/14/24 1908 07/16/24 0601 07/17/24 0438 07/18/24 0450 07/18/24 2051  WBC 6.2  --  5.8 8.7 10.3 11.8*  CREATININE 0.84 0.80 1.03 0.94 0.99 1.35*  LATICACIDVEN  --   --   --   --   --  3.9*    Estimated Creatinine Clearance: 40.3 mL/min (A) (by C-G formula based on SCr of 1.35 mg/dL (H)).    Allergies  Allergen Reactions   Clonidine  Other (See Comments)    Weakness, lower extremity paralysis feeling   Penicillins Shortness Of Breath    Patient reports reaction occurred as a teenager and he experienced shortness of breath not requiring hospitalization.    Amlodipine Swelling   Lisinopril Swelling   Metoprolol Swelling   Tylenol  [Acetaminophen ]     Microbiology results: 07/15/24 MRSA PCR: negative 11/15 BCx: sent  Thank you for allowing  pharmacy to be a part of this patient's care.  Maurilio Fila, PharmD Clinical Pharmacist 07/18/2024  10:26 PM

## 2024-07-18 NOTE — Progress Notes (Addendum)
 PROGRESS NOTE  Dann D Riche FMW:[id number] DOB: [birth year] DOA: 07/14/2024 PCP: Vicci Mliss SAUNDERS, FNP   LOS: 4 days   Brief narrative:  Lawrence Barnett is a 73 y.o. male with past medical history of HTN, arthritis presented to the hospital after being found by his nephew lying on the floor with left-sided weakness.  He uses a lot of Advil  at baseline for arthritis and pain.  EMS was called in and patient was brought into the hospital for acute stroke and patient was noted to have blood blood pressure elevated more than 200 systolic.  In the ED, labs were remarkable for mild hypokalemia with potassium of 3.3.  Urine drug screen was negative.  CT head scan was done which showed 7 mL right basal ganglia intracranial hemorrhage with intraventricular extension.  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 Patient was initially admitted to the ICU and repeat scans were done.  Blood pressure control with goal systolic at 130-1 50 and was initially on Cleviprex and labetalol.  Lipid profile with LDL of 73.  Hemoglobin A1c of 5.6.  MRI of the brain showed acute intraparenchymal hemorrhage in the right thalamus measuring 2.8 x 2.7 x 3.3 cm with intraventricular extension.  CT angiogram of the head and neck without large vessel occlusion.  Seen by speech therapy and currently on dysphagia 3 diet.  physical therapy has evaluated the patient and at this time recommendation is CIR.  Hypertension.  Hypertension goal systolic 130-150.  Currently on hydralazine  as needed, labetalol, HCTZ, hydralazine  and losartan.  Blood pressure today elevated at 151/108.  Will adjust medications but patient is allergic to metoprolol, amlodipine and lisinopril..  Hypokalemia.  Improved after replacement.  Latest potassium of 4.1.  Fever  tachycardia.  Temperature max 102.5 F.  Will continue to monitor close.  Patient on room air.  No leukocytosis but nursing staff reported some cough after having breakfast.  Will keep NPO.  Aspiration precautions.  Chest x-ray initial was negative.  Will put the patient on Rocephin and Flagyl since allergic to penicillin..  Will continue D5 normal saline for hydration at this time.  Speech therapy had seen the patient and recommended dysphagia 3 diet which we will hold for now.  Patient might need small bore NG tube for feeding at this time.  Will order for 1.  Superficial rash over the left knee and forearm.  Appears to be irritation related.  Could be secondary to adhesive tape.  Will continue to observe.  No itching.  DVT prophylaxis: heparin injection 5,000 Units Start: 07/16/24 1400 SCD's Start: 07/14/24 1909   Disposition: CIR as per PT evaluation likely in 1 to 2 days  Status is: Inpatient Remains inpatient appropriate because: Pending clinical improvement, need for CIR,    Code Status:     Code Status: Full Code  Family Communication: Spoke with the patient's niece at bedside on 07/17/2024   Patient lives with nephew at home.  Consultants: Neurology  Procedures: None  Anti-infectives:  Metronidazole and Rocephin 07/18/2024  Anti-infectives (From admission, onward)    Start     Dose/Rate Route Frequency Ordered Stop   07/18/24 1000  metroNIDAZOLE (FLAGYL) tablet 500 mg        500 mg Oral Every 12 hours 07/18/24 0820     07/18/24 0915  cefTRIAXone (ROCEPHIN) 2 g in  sodium chloride  0.9 % 100 mL IVPB        2 g 200 mL/hr over 30 Minutes Intravenous Every 24 hours 07/18/24 0820        Subjective: Today, patient was seen and examined at bedside.  Patient febrile but denies any pain nausea vomiting.  Had some coughing after breakfast yesterday.  Feels fatigued and weak..  Objective: Vitals:   07/18/24 0556 07/18/24 0749  BP: 110/78 (!) 116/90  Pulse: (!) 111 (!) 117   Resp:  17  Temp: 98.3 F (36.8 C) 97.7 F (36.5 C)  SpO2: 99% 100%    Intake/Output Summary (Last 24 hours) at 07/18/2024 1025 Last data filed at 07/18/2024 0926 Gross per 24 hour  Intake 471.34 ml  Output 2050 ml  Net -1578.66 ml   Filed Weights   07/14/24 1924 07/15/24 0100 07/18/24 0500  Weight: 67.4 kg 63.5 kg 60.2 kg   Body mass index is 18 kg/m.   Physical Exam: GENERAL: Patient is slightly somnolent but Communicative.   Not in obvious distress.  Elderly male,  HENT: No scleral pallor or icterus. Pupils equally reactive to light. Oral mucosa is moist NECK: is supple, no gross swelling noted. CHEST: Diminished breath sounds bilaterally. CVS: S1 and S2 heard, no murmur. Regular rate and rhythm.  ABDOMEN: Soft, non-tender, bowel sounds are present.  External urinary catheter EXTREMITIES: No edema. CNS: Follows commands.  Left-sided weakness noted.  Left facial droop. SKIN: warm and dry without rashes.  Mild erythematous area over the left dorsal hand and knee with some blanching but no pruritus.  Data Review: I have personally reviewed the following laboratory data and studies,  CBC: Recent Labs  Lab 07/14/24 1904 07/14/24 1908 07/16/24 0601 07/17/24 0438 07/18/24 0450  WBC 6.2  --  5.8 8.7 10.3  NEUTROABS 3.5  --   --   --   --   HGB 15.3 16.0 16.8 16.7 17.7*  HCT 46.7 47.0 50.7 50.3 53.7*  MCV 81.6  --  83.0 81.9 83.5  PLT 200  --  226 216 202   Basic Metabolic Panel: Recent Labs  Lab 07/14/24 1904 07/14/24 1908 07/16/24 0601 07/17/24 0438 07/18/24 0450  NA 141 143 142 145 148*  K 3.5 3.3* 4.3 4.1 4.1  CL 105 107 108 111 115*  CO2 22  --  24 20* 17*  GLUCOSE 105* 103* 97 93 92  BUN 10 11 12 15  24*  CREATININE 0.84 0.80 1.03 0.94 0.99  CALCIUM 9.4  --  9.9 9.8 9.8   Liver Function Tests: Recent Labs  Lab 07/14/24 1904  AST 29  ALT 18  ALKPHOS 140*  BILITOT 0.8  PROT 8.2*  ALBUMIN 3.6   No results for input(s): LIPASE, AMYLASE in  the last 168 hours. No results for input(s): AMMONIA in the last 168 hours. Cardiac Enzymes: No results for input(s): CKTOTAL, CKMB, CKMBINDEX, TROPONINI in the last 168 hours. BNP (last 3 results) No results for input(s): BNP in the last 8760 hours.  ProBNP (last 3 results) No results for input(s): PROBNP in the last 8760 hours.  CBG: Recent Labs  Lab 07/14/24 1903  GLUCAP 112*   Recent Results (from the past 240 hours)  MRSA Next Gen by PCR, Nasal     Status: None   Collection Time: 07/15/24  1:15 AM   Specimen: Nasal Mucosa; Nasal Swab  Result Value Ref Range Status   MRSA by PCR Next Gen NOT DETECTED NOT DETECTED Final  Comment: (NOTE) The GeneXpert MRSA Assay (FDA approved for NASAL specimens only), is one component of a comprehensive MRSA colonization surveillance program. It is not intended to diagnose MRSA infection nor to guide or monitor treatment for MRSA infections. Test performance is not FDA approved in patients less than 58 years old. Performed at [facility], [address] [place], [place] [address]      Studies: DG CHEST PORT 1 VIEW Result Date: 07/17/2024 EXAM: 1 VIEW(S) XRAY OF THE CHEST 07/17/2024 10:00:00 AM COMPARISON: 07/16/2019 CLINICAL HISTORY: Fever FINDINGS: LUNGS AND PLEURA: No focal pulmonary opacity. No pleural effusion. No pneumothorax. HEART AND MEDIASTINUM: No acute abnormality of the cardiac and mediastinal silhouettes. BONES AND SOFT TISSUES: No acute osseous abnormality. IMPRESSION: 1. No acute cardiopulmonary process. Electronically signed by: Ryan Chess MD 07/17/2024 02:38 PM EST RP Workstation: [id number]      Vernal Alstrom, MD  [facility] 07/18/2024  If 7PM-7AM, please contact night-coverage

## 2024-07-19 ENCOUNTER — Inpatient Hospital Stay (HOSPITAL_COMMUNITY)

## 2024-07-19 DIAGNOSIS — I10 Essential (primary) hypertension: Secondary | ICD-10-CM | POA: Diagnosis not present

## 2024-07-19 DIAGNOSIS — I615 Nontraumatic intracerebral hemorrhage, intraventricular: Secondary | ICD-10-CM | POA: Diagnosis not present

## 2024-07-19 DIAGNOSIS — R29712 NIHSS score 12: Secondary | ICD-10-CM | POA: Diagnosis not present

## 2024-07-19 DIAGNOSIS — I610 Nontraumatic intracerebral hemorrhage in hemisphere, subcortical: Secondary | ICD-10-CM | POA: Diagnosis not present

## 2024-07-19 LAB — POCT I-STAT 7, (LYTES, BLD GAS, ICA,H+H)
Acid-Base Excess: 10 mmol/L — ABNORMAL HIGH (ref 0.0–2.0)
Bicarbonate: 32 mmol/L — ABNORMAL HIGH (ref 20.0–28.0)
Calcium, Ion: 1.21 mmol/L (ref 1.15–1.40)
HCT: 42 % (ref 39.0–52.0)
Hemoglobin: 14.3 g/dL (ref 13.0–17.0)
O2 Saturation: 100 %
Potassium: 2.5 mmol/L — CL (ref 3.5–5.1)
Sodium: 158 mmol/L — ABNORMAL HIGH (ref 135–145)
TCO2: 33 mmol/L — ABNORMAL HIGH (ref 22–32)
pCO2 arterial: 32.3 mmHg (ref 32–48)
pH, Arterial: 7.603 (ref 7.35–7.45)
pO2, Arterial: 212 mmHg — ABNORMAL HIGH (ref 83–108)

## 2024-07-19 LAB — GLUCOSE, CAPILLARY
Glucose-Capillary: 103 mg/dL — ABNORMAL HIGH (ref 70–99)
Glucose-Capillary: 66 mg/dL — ABNORMAL LOW (ref 70–99)
Glucose-Capillary: 82 mg/dL (ref 70–99)
Glucose-Capillary: 84 mg/dL (ref 70–99)

## 2024-07-19 LAB — CBC
HCT: 52.1 % — ABNORMAL HIGH (ref 39.0–52.0)
HCT: 47.4 % (ref 39.0–52.0)
Hemoglobin: 17 g/dL (ref 13.0–17.0)
Hemoglobin: 15.3 g/dL (ref 13.0–17.0)
MCH: 27.6 pg (ref 26.0–34.0)
MCH: 27.3 pg (ref 26.0–34.0)
MCHC: 32.6 g/dL (ref 30.0–36.0)
MCHC: 32.3 g/dL (ref 30.0–36.0)
MCV: 84.6 fL (ref 80.0–100.0)
MCV: 84.6 fL (ref 80.0–100.0)
Platelets: 220 K/uL (ref 150–400)
Platelets: 203 K/uL (ref 150–400)
RBC: 6.16 MIL/uL — ABNORMAL HIGH (ref 4.22–5.81)
RBC: 5.6 MIL/uL (ref 4.22–5.81)
RDW: 16.3 % — ABNORMAL HIGH (ref 11.5–15.5)
RDW: 15.6 % — ABNORMAL HIGH (ref 11.5–15.5)
WBC: 16.2 K/uL — ABNORMAL HIGH (ref 4.0–10.5)
WBC: 13.7 K/uL — ABNORMAL HIGH (ref 4.0–10.5)
nRBC: 0 % (ref 0.0–0.2)
nRBC: 0 % (ref 0.0–0.2)

## 2024-07-19 LAB — BASIC METABOLIC PANEL WITH GFR
Anion gap: 20 — ABNORMAL HIGH (ref 5–15)
BUN: 35 mg/dL — ABNORMAL HIGH (ref 8–23)
CO2: 14 mmol/L — ABNORMAL LOW (ref 22–32)
Calcium: 9.5 mg/dL (ref 8.9–10.3)
Chloride: 119 mmol/L — ABNORMAL HIGH (ref 98–111)
Creatinine, Ser: 1.37 mg/dL — ABNORMAL HIGH (ref 0.61–1.24)
GFR, Estimated: 54 mL/min — ABNORMAL LOW (ref >60)
Glucose, Bld: 100 mg/dL — ABNORMAL HIGH (ref 70–99)
Potassium: 3.6 mmol/L (ref 3.5–5.1)
Sodium: 153 mmol/L — ABNORMAL HIGH (ref 135–145)

## 2024-07-19 LAB — LACTIC ACID, PLASMA
Lactic Acid, Venous: 4.6 mmol/L (ref 0.5–1.9)
Lactic Acid, Venous: 6.6 mmol/L (ref 0.5–1.9)

## 2024-07-19 LAB — MAGNESIUM: Magnesium: 2.1 mg/dL (ref 1.7–2.4)

## 2024-07-19 LAB — TSH: TSH: <0.1 u[IU]/mL — ABNORMAL LOW (ref 0.350–4.500)

## 2024-07-19 LAB — PHOSPHORUS: Phosphorus: 2.7 mg/dL (ref 2.5–4.6)

## 2024-07-19 LAB — MRSA NEXT GEN BY PCR, NASAL: MRSA by PCR Next Gen: NOT DETECTED

## 2024-07-19 LAB — BETA-HYDROXYBUTYRIC ACID: Beta-Hydroxybutyric Acid: 0.84 mmol/L — ABNORMAL HIGH (ref 0.05–0.27)

## 2024-07-19 LAB — AMMONIA: Ammonia: 59 umol/L — ABNORMAL HIGH (ref 9–35)

## 2024-07-19 MED ORDER — ACETAMINOPHEN 325 MG PO TABS
650.0000 mg | ORAL_TABLET | Freq: Four times a day (QID) | ORAL | Status: DC | PRN
  Administered 2024-07-19 – 2024-07-31 (×8): 650 mg
  Filled 2024-07-19 (×8): qty 2

## 2024-07-19 MED ORDER — LACTATED RINGERS IV BOLUS
2000.0000 mL | Freq: Once | INTRAVENOUS | Status: AC
  Administered 2024-07-19: 2000 mL via INTRAVENOUS

## 2024-07-19 MED ORDER — SODIUM CHLORIDE 0.9 % IV BOLUS: 1000.0000 mL | Freq: Once | INTRAVENOUS | Status: DC

## 2024-07-19 MED ORDER — ETOMIDATE 2 MG/ML IV SOLN: 20.0000 mg | Freq: Once | INTRAVENOUS | Status: AC

## 2024-07-19 MED ORDER — DEXTROSE 50 % IV SOLN
INTRAVENOUS | Status: AC
  Filled 2024-07-19: qty 50

## 2024-07-19 MED ORDER — DEXMEDETOMIDINE HCL IN NACL 400 MCG/100ML IV SOLN
0.0000 ug/kg/h | INTRAVENOUS | Status: DC
  Administered 2024-07-20: 0.4 ug/kg/h via INTRAVENOUS
  Filled 2024-07-19: qty 100

## 2024-07-19 MED ORDER — FREE WATER
200.0000 mL | Freq: Four times a day (QID) | Status: DC
  Administered 2024-07-19 – 2024-07-20 (×3): 200 mL

## 2024-07-19 MED ORDER — DEXTROSE 5 % IV SOLN: INTRAVENOUS | Status: AC

## 2024-07-19 MED ORDER — FENTANYL CITRATE (PF) 50 MCG/ML IJ SOSY
50.0000 ug | PREFILLED_SYRINGE | INTRAMUSCULAR | Status: DC | PRN
  Administered 2024-07-20: 50 ug via INTRAVENOUS
  Filled 2024-07-19: qty 1

## 2024-07-19 MED ORDER — THIAMINE MONONITRATE 100 MG PO TABS
100.0000 mg | ORAL_TABLET | Freq: Every day | ORAL | Status: AC
Start: 2024-07-19 — End: 2024-07-25
  Administered 2024-07-19 – 2024-07-25 (×7): 100 mg
  Filled 2024-07-19 (×7): qty 1

## 2024-07-19 MED ORDER — PHENYLEPHRINE 80 MCG/ML (10ML) SYRINGE FOR IV PUSH (FOR BLOOD PRESSURE SUPPORT): 80.0000 ug | PREFILLED_SYRINGE | Freq: Once | INTRAVENOUS | Status: AC | PRN

## 2024-07-19 MED ORDER — MIDAZOLAM HCL 2 MG/2ML IJ SOLN
INTRAMUSCULAR | Status: AC
  Filled 2024-07-19: qty 4

## 2024-07-19 MED ORDER — STERILE WATER FOR INJECTION IV SOLN
INTRAVENOUS | Status: DC
  Filled 2024-07-19: qty 1000

## 2024-07-19 MED ORDER — SODIUM BICARBONATE 8.4 % IV SOLN
INTRAVENOUS | Status: DC
  Filled 2024-07-19: qty 1000

## 2024-07-19 MED ORDER — SODIUM BICARBONATE 8.4 % IV SOLN
INTRAVENOUS | Status: AC
  Administered 2024-07-19: 150 meq via INTRAVENOUS
  Filled 2024-07-19: qty 100

## 2024-07-19 MED ORDER — DOCUSATE SODIUM 50 MG/5ML PO LIQD: 100.0000 mg | Freq: Two times a day (BID) | ORAL | Status: DC

## 2024-07-19 MED ORDER — SODIUM BICARBONATE 8.4 % IV SOLN
INTRAVENOUS | Status: AC
  Filled 2024-07-19: qty 50

## 2024-07-19 MED ORDER — PHENYLEPHRINE 80 MCG/ML (10ML) SYRINGE FOR IV PUSH (FOR BLOOD PRESSURE SUPPORT)
PREFILLED_SYRINGE | INTRAVENOUS | Status: AC
  Filled 2024-07-19: qty 10

## 2024-07-19 MED ORDER — FENTANYL CITRATE (PF) 50 MCG/ML IJ SOSY
50.0000 ug | PREFILLED_SYRINGE | INTRAMUSCULAR | Status: DC | PRN
  Administered 2024-07-25: 50 ug via INTRAVENOUS
  Filled 2024-07-19: qty 1

## 2024-07-19 MED ORDER — OSMOLITE 1.2 CAL PO LIQD
1000.0000 mL | ORAL | Status: DC
  Filled 2024-07-19: qty 1000

## 2024-07-19 MED ORDER — ETOMIDATE 2 MG/ML IV SOLN
INTRAVENOUS | Status: AC
  Administered 2024-07-19: 20 mg via INTRAVENOUS
  Filled 2024-07-19: qty 10

## 2024-07-19 MED ORDER — CARMEX CLASSIC LIP BALM EX OINT
TOPICAL_OINTMENT | CUTANEOUS | Status: DC | PRN
  Filled 2024-07-19 (×2): qty 10

## 2024-07-19 MED ORDER — OSMOLITE 1.2 CAL PO LIQD: 1000.0000 mL | ORAL | Status: DC

## 2024-07-19 MED ORDER — LACTULOSE 10 GM/15ML PO SOLN
20.0000 g | Freq: Three times a day (TID) | ORAL | Status: DC
  Administered 2024-07-19 – 2024-07-20 (×4): 20 g
  Filled 2024-07-19 (×4): qty 30

## 2024-07-19 MED ORDER — SODIUM BICARBONATE 8.4 % IV SOLN
150.0000 meq | Freq: Once | INTRAVENOUS | Status: AC
  Filled 2024-07-19: qty 50

## 2024-07-19 MED ORDER — POLYETHYLENE GLYCOL 3350 17 G PO PACK: 17.0000 g | PACK | Freq: Every day | ORAL | Status: DC

## 2024-07-19 MED ORDER — FENTANYL CITRATE (PF) 50 MCG/ML IJ SOSY
PREFILLED_SYRINGE | INTRAMUSCULAR | Status: AC
  Administered 2024-07-19: 50 ug via INTRAVENOUS
  Filled 2024-07-19: qty 2

## 2024-07-19 MED ORDER — FENTANYL CITRATE (PF) 50 MCG/ML IJ SOSY: 50.0000 ug | PREFILLED_SYRINGE | Freq: Once | INTRAMUSCULAR | Status: AC

## 2024-07-19 MED ORDER — LINEZOLID 600 MG PO TABS
600.0000 mg | ORAL_TABLET | Freq: Two times a day (BID) | ORAL | Status: DC
  Administered 2024-07-20: 600 mg
  Filled 2024-07-19 (×3): qty 1

## 2024-07-19 NOTE — Progress Notes (Signed)
 Brief Nutrition Note  Consult received for enteral/tube feeding initiation and management.  Adult Enteral Nutrition Protocol initiated. Full assessment to follow.  NGT tube in place with tip located in gastric antrum per xray imaging.   Admitting Dx: Brain bleed (HCC) [I61.9] ICH (intracerebral hemorrhage) (HCC) [I61.9] Left-sided weakness [R53.1] Cerebrovascular accident (CVA), unspecified mechanism (HCC) [I63.9] Hypertension, unspecified type [I10]  Body mass index is 17.76 kg/m. Pt meets criteria for underweight based on current BMI.  Labs:  Recent Labs  Lab 07/18/24 0450 07/18/24 2051 07/19/24 0255  NA 148* 152* 153*  K 4.1 4.0 3.6  CL 115* 119* 119*  CO2 17* 18* 14*  BUN 24* 36* 35*  CREATININE 0.99 1.35* 1.37*  CALCIUM 9.8 9.8 9.5  GLUCOSE 92 99 100*    Lawrence Barnett, RDN, LDN Clinical Nutrition See AMiON for contact information.

## 2024-07-19 NOTE — Progress Notes (Addendum)
 STROKE TEAM PROGRESS NOTE    SIGNIFICANT HOSPITAL EVENTS 11/11: Patient admitted with right basal ganglia ICH with IVH 11/13: Patient transferred out of the ICU  INTERIM HISTORY/SUBJECTIVE His daughter is at the bedside.  Patient has been started on antibiotics which have now been changed to vancomycin , cefepime  and Flagyl.  Mental status slightly improved.  White count is still elevated.  Still febrile requiring Tylenol  around-the-clock.  CT head done yesterday showed slight decrease in right thalamic parenchymal and intraventricular hemorrhage but slight increase in ventricular size.  Remains tachycardic and blood pressure is controlled.  OBJECTIVE  CBC    Component Value Date/Time   WBC 16.2 (H) 07/19/2024 0255   RBC 6.16 (H) 07/19/2024 0255   HGB 17.0 07/19/2024 0255   HGB 16.2 10/07/2012 1618   HCT 52.1 (H) 07/19/2024 0255   HCT 47.8 10/07/2012 1618   PLT 220 07/19/2024 0255   PLT 306 10/07/2012 1618   MCV 84.6 07/19/2024 0255   MCV 92 10/07/2012 1618   MCH 27.6 07/19/2024 0255   MCHC 32.6 07/19/2024 0255   RDW 16.3 (H) 07/19/2024 0255   RDW 13.9 10/07/2012 1618   LYMPHSABS 2.1 07/14/2024 1904   MONOABS 0.4 07/14/2024 1904   EOSABS 0.1 07/14/2024 1904   BASOSABS 0.0 07/14/2024 1904    BMET    Component Value Date/Time   NA 153 (H) 07/19/2024 0255   NA 139 10/08/2012 0431   K 3.6 07/19/2024 0255   K 4.0 10/08/2012 0431   CL 119 (H) 07/19/2024 0255   CL 107 10/08/2012 0431   CO2 14 (L) 07/19/2024 0255   CO2 25 10/08/2012 0431   GLUCOSE 100 (H) 07/19/2024 0255   GLUCOSE 133 (H) 10/08/2012 0431   BUN 35 (H) 07/19/2024 0255   BUN 10 10/08/2012 0431   CREATININE 1.37 (H) 07/19/2024 0255   CREATININE 0.83 10/08/2012 0431   CALCIUM 9.5 07/19/2024 0255   CALCIUM 9.4 10/08/2012 0431   GFRNONAA 54 (L) 07/19/2024 0255   GFRNONAA >60 10/08/2012 0431    IMAGING past 24 hours CT HEAD WO CONTRAST ( ) Result Date: 07/18/2024 EXAM: CT HEAD WITHOUT CONTRAST 07/18/2024  11:04:43 PM TECHNIQUE: CT of the head was performed without the administration of intravenous contrast. Automated exposure control, iterative reconstruction, and/or weight based adjustment of the mA/kV was utilized to reduce the radiation dose to as low as reasonably achievable. COMPARISON: CT head Jul 15, 2024 CLINICAL HISTORY: Mental status change, unknown cause FINDINGS: BRAIN AND VENTRICLES: Slight interval decrease in size of the recent right thalamic hemorrhage, which now measures approximately 3.3 x 2.5 x 2.2 cm. Intraventricular hemorrhage is also decreased. 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 No evidence of acute infarct. No extra-axial collection. No mass effect or midline shift. Patchy white matter hypodensiteis are compatible with chronic microvascular ischemic change. ORBITS: No acute abnormality. 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 Mastoid air cells are clear. SOFT TISSUES AND SKULL: No skull fracture. Findings will be conveyed to the clinical team by the radiology assistant and documented in PACs/Clario. IMPRESSION: 1. 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 2. Mild interval decrease in right thalamic intraparenchymal hemorrhage and intraventricular hemorrhage. Electronically signed by: Gilmore Molt MD 07/18/2024 11:30 PM EST RP Workstation: [id number]   DG CHEST PORT 1 VIEW Result  Date: 07/18/2024 EXAM: 1 VIEW(S) XRAY OF THE CHEST 07/18/2024 08:57:00 PM COMPARISON: None available. CLINICAL HISTORY: Sepsis (HCC) [id number]; Respiratory distress 141876. FINDINGS: LINES, TUBES AND DEVICES: Intertrochanterics below the hemidiaphragm with the tip inside the cord collimated off view. LUNGS AND PLEURA: Left  base atelectasis. No pleural effusion. No pneumothorax. HEART AND MEDIASTINUM: No acute abnormality of the cardiac and mediastinal silhouettes. BONES AND SOFT TISSUES: No acute osseous abnormality. IMPRESSION: 1. No acute findings. Electronically signed by: Morgane Naveau MD 07/18/2024 09:06 PM EST RP Workstation: [id number]   DG Abd Portable 1V Result Date: 07/18/2024 EXAM: 1 VIEW XRAY OF THE ABDOMEN 07/18/2024 02:37:00 PM COMPARISON: None available. CLINICAL HISTORY: Nasogastric tube present. FINDINGS: LINES, TUBES AND DEVICES: Nasogastric tube has been advanced, tip in the region of the gastric antrum. BOWEL: Nonobstructive bowel gas pattern. Residual contrast in the visualized colon. SOFT TISSUES: No opaque urinary calculi. BONES: No acute osseous abnormality. IMPRESSION: 1. Nasogastric tube tip in the region of the gastric antrum. 2. Residual contrast within the colon. Electronically signed by: Katheleen Faes MD 07/18/2024 02:43 PM EST RP Workstation: [id number]     Vitals:   07/19/24 0500 07/19/24 0542 07/19/24 0900 07/19/24 1155  BP:  (!) 127/107 130/88 (!) 133/90  Pulse:   (!) 123 100  Resp:   18 20  Temp:   99.9 F (37.7 C) 99.5 F (37.5 C)  TempSrc:   Axillary Oral  SpO2:   100% 92%  Weight: 59.4 kg     Height:         PHYSICAL EXAM General:   , well-nourished, well-developed elderly patient in no acute distress Psych:  Mood and affect appropriate for situation CV: Regular rate and rhythm on monitor Respiratory:  Regular, unlabored respirations on room air GI: Abdomen soft and nontender   NEURO:  Mental Status: He is drowsy but can be aroused.  Follows simple commands on the right and moves right side purposefully Speech/Language: speech is with moderate dysarthria with naming, repetition, and comprehension intact.  Cranial Nerves:  II: PERRL. Visual fields full.  III, IV, VI: Right gaze preference, able to cross midline V: Sensation is intact to light touch and  symmetrical to face.  VII: Left facial droop VIII: hearing intact to voice. IX, X: Voice is dysarthric XII: tongue is midline without fasciculations. Motor: Able to follow commands with right upper and lower extremities with antigravity strength, no movement of left upper extremity but is able to flicker left lower extremity to noxious Tone: is normal and bulk is normal Sensation-sensation diminished on the left Coordination: FTN intact on the right, unable to perform on the left Gait- deferred  Most Recent NIH  1a Level of Conscious.: 0 1b LOC Questions: 0 1c LOC Commands: 0 2 Best Gaze: 1 3 Visual: 0 4 Facial Palsy: 1 5a Motor Arm - left: 4 5b Motor Arm - Right: 0 6a Motor Leg - Left: 4 6b Motor Leg - Right: 0 7 Limb Ataxia: 0 8 Sensory: 0 9 Best Language: 0 10 Dysarthria: 2 11 Extinct. and Inatten.: 0 TOTAL: 12   ASSESSMENT/PLAN  Mr. KAYN HAYMORE is a 73 y.o. male with history of hypertension admitted for left-sided hemiplegia.  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  He did not pass his swallow study and will have cortrak inserted NIH on Admission 17  ICH:  right ganglia/thalamus ICH with IVH, etiology: Likely hypertensive Code Stroke CT head 7 mm acute IPH centered at  right thalamus with IVH, no hydrocephalus CTA head & neck no LVO or hemodynamically significant stenosis, no vascular abnormality underlying right thalamic hemorrhage  Follow-up head CT slight interval increase in size of right thalamic hemorrhage, slightly increased IVH MRI acute right thalamic IPH with IVH, stable, no evidence of underlying mass 2D Echo EF 55-60% LDL 73 HgbA1c 5.6 UDS neg VTE prophylaxis - heparin subq No antithrombotic prior to admission, now on No antithrombotic secondary to IPH Therapy recommendations:  CIR Disposition: Pending  Hypertension Home meds: None BP stable Now stable on the high end On losartan 100, hydralazine  50mg  TID BP goal  < 160 Long-term BP goal normotensive  Lipid management Home meds: None LDL 73, goal < 70 Add statin at discharge  Fever with tachycardia ?  Aspiration Tmax 102.6 with lethargy Concern for aspiration CXR negative Keep NPO and close monitoring On   antibiotic with vancomycin , cefepime  and flagyl   AKI Hypernatremia Na 145--148--152 Cre 0.84-0.99-1.35 On IVF  Given bolus\ Close monitoring  Dysphagia Patient has post-stroke dysphagia, SLP consulted  Was on soft diet with nectar thick liquids, now n.p.o. due to concern of aspiration Continue IVF   Other Stroke Risk Factors Advanced age   Hospital day # 5     Pt lethargic, but able to open eyes on voice and still orientated but soft voice and slow response. Still has left hemiplegia. Still has spiking fever and tachycardia, developed AKI and hypernatremia.   Tachycardia likely due to fever, now on empiric abx. Management per primary team. Will follow.  Discussed with daughter at the bedside.    I personally spent a total of 35 minutes in the care of the patient today including getting/reviewing separately obtained history, performing a medically appropriate exam/evaluation, counseling and educating, placing orders, referring and communicating with other health care professionals, documenting clinical information in the EHR, independently interpreting results, and coordinating care.         Eather Popp, MD Stroke Neurology 07/19/2024 1:35 PM         To contact Stroke Continuity provider, please refer to [email or web address]. After hours, contact General Neurology

## 2024-07-19 NOTE — Procedures (Signed)
 Intubation Procedure Note  Lawrence Barnett  [id number]  07-25-1951  Date:07/19/24  Time:10:44 PM   Provider Performing:Pecolia Marando E Tris Howell    Procedure: Intubation (31500)  Indication(s) Respiratory Failure  Consent Unable to obtain consent due to emergent nature of procedure.   Anesthesia Etomidate   Time Out Verified patient identification, verified procedure, site/side was marked, verified correct patient position, special equipment/implants available, medications/allergies/relevant history reviewed, required imaging and test results available.   Sterile Technique Usual hand hygeine, masks, and gloves were used   Procedure Description Patient positioned in bed supine.  Sedation given as noted above.  Patient was intubated with endotracheal tube using Glidescope.  View was Grade 1 full glottis .  Number of attempts was 1.  Colorimetric CO2 detector was consistent with tracheal placement.   Complications/Tolerance None; patient tolerated the procedure well. Chest X-ray is ordered to verify placement.   EBL Minimal   Specimen(s) None

## 2024-07-19 NOTE — Progress Notes (Signed)
 I discussed this patient with Dr. Charlton with the Hospitalist team overnight. A rapid response was called earlier for fever, tachycardia and tachypnea along with poor responsiveness.  He was started on broad spectrum antibiotics and a repeat CT Head was obtained and concerning for maybe mild hydrocephalus. To me, the size of the ventricles appear stable.  I saw the patient overnight. He partially opens eyes to voice and tactile stimulation. He is able to mouth his name, give me a thumbs up and high five with the R hand and wiggles his R toes on command. He is significantly somnolent and goes right back to sleep after the exam.   Plan: - will get repeat CT Head again in AM. Stroke team to follow in AM.  Lawrence Barnett [facility]

## 2024-07-19 NOTE — Consult Note (Signed)
 NAME:  Lawrence Barnett, MRN:  [id number], DOB:  01-Dec-1950, LOS: 5 ADMISSION DATE:  07/14/2024, CONSULTATION DATE:  07/19/24 REFERRING MD:  TRH, CHIEF COMPLAINT:  obtunded   History of Present Illness:  73 yo male admitted 11/11 with L sided weakness and R gaze found to have R basal ganglia ICH with intraventricular extension. Pt was progressing thru his hospitalization with a superimposed infection 2/2 suspected aspiration pneumonia being empirically treated.   Pt's mental status declined yesterday evening prompting overnight evaluation via RRT. Pt was reportedly found obtunded, tachycardic and tachypneic. VSS however. Abx were broadened, pt was given fluids, and stat cth revealing mildly increased ventricular size concerning for mildly progressive hydrocephalus. Neuro was reached out to who recommended repeat cth to eval progression further at 0800.   Pt's labs at 0200 this am revealed multiple areas for improvement that could contribute to pt's mental status as well. Thru the course of the day however it appears that pt's mental status did not improve, bicarb/renal function/ammonia/lactate/sodium have been worsening as well. Cth has yet to be complete.   Ccm was consulted this evening as pt remains obtunded and req ICU transfer.   Pertinent  Medical History  htn  Significant Hospital Events: Including procedures, antibiotic start and stop dates in addition to other pertinent events   Admitted to ICU 11/11 Transferred to floor 11/13 Ccm consulted and transferred back to ICU 11/16  Interim History / Subjective:    Objective    Blood pressure 111/63, pulse (!) 128, temperature (!) 100.7 F (38.2 C), temperature source Axillary, resp. rate (!) 40, height 6' (1.829 m), weight 59.4 kg, SpO2 92%.        Intake/Output Summary (Last 24 hours) at 07/19/2024 2133 Last data filed at 07/19/2024 1749 Gross per 24 hour  Intake 1370.14 ml  Output 300 ml  Net 1070.14 ml   Filed Weights    07/15/24 0100 07/18/24 0500 07/19/24 0500  Weight: 63.5 kg 60.2 kg 59.4 kg    Examination: General: sonorous respirations, upper airway congestion, not responsive to painful stim. Coughs with NT suction HENT: [place], L orbital edema noted, edentulous, mmmp Lungs: coarse upper airway, diminished otherwise Cardiovascular: rrr Abdomen: soft nt nd bs+ Extremities: no c/c/e, no movement noted but grimace with NT suction Neuro: extremely poorly responsive GU: deferred  Resolved problem list   Assessment and Plan  Acute encephalopathy Hypernatremia Metabolic acidosis Lactic acidosis Elevated ammonia Iph with ivh with hydrocephalus H/o htn -stat cth, originally ordered for 0800 -3 amp bicarb stat -bicarb infusion in d5 -trend lactate -start lactulose via og -recheck endpoints -monitor renal function with worsening renal function  -monitor I/O -renal u/s -check t4 and t3 with undetectable tsh and encephalopathy, tachycardia and hyperthermia that persists.   Labs   CBC: Recent Labs  Lab 07/14/24 1904 07/14/24 1908 07/16/24 0601 07/17/24 0438 07/18/24 0450 07/18/24 2051 07/19/24 0255  WBC 6.2  --  5.8 8.7 10.3 11.8* 16.2*  NEUTROABS 3.5  --   --   --   --   --   --   HGB 15.3   < > 16.8 16.7 17.7* 17.2* 17.0  HCT 46.7   < > 50.7 50.3 53.7* 54.1* 52.1*  MCV 81.6  --  83.0 81.9 83.5 84.4 84.6  PLT 200  --  226 216 202 238 220   < > = values in this interval not displayed.    Basic Metabolic Panel: Recent Labs  Lab 07/16/24 0601 07/17/24 0438 07/18/24  9549 07/18/24 2051 07/19/24 0255 07/19/24 1458  NA 142 145 148* 152* 153*  --   K 4.3 4.1 4.1 4.0 3.6  --   CL 108 111 115* 119* 119*  --   CO2 24 20* 17* 18* 14*  --   GLUCOSE 97 93 92 99 100*  --   BUN 12 15 24* 36* 35*  --   CREATININE 1.03 0.94 0.99 1.35* 1.37*  --   CALCIUM 9.9 9.8 9.8 9.8 9.5  --   MG  --   --   --   --   --  2.1  PHOS  --   --   --   --   --  2.7   GFR: Estimated Creatinine Clearance:  39.1 mL/min (A) (by C-G formula based on SCr of 1.37 mg/dL (H)). Recent Labs  Lab 07/17/24 0438 07/18/24 0450 07/18/24 2051 07/19/24 0255  PROCALCITON  --   --  3.34  --   WBC 8.7 10.3 11.8* 16.2*  LATICACIDVEN  --   --  3.9* 4.6*    Liver Function Tests: Recent Labs  Lab 07/14/24 1904 07/18/24 2051  AST 29 87*  ALT 18 39  ALKPHOS 140* 95  BILITOT 0.8 3.3*  PROT 8.2* 8.5*  ALBUMIN 3.6 3.0*   No results for input(s): LIPASE, AMYLASE in the last 168 hours. Recent Labs  Lab 07/19/24 0255  AMMONIA 59*    ABG    Component Value Date/Time   PHART 7.46 (H) 07/18/2024 2250   PCO2ART 30 (L) 07/18/2024 2250   PO2ART 101 07/18/2024 2250   HCO3 21.1 07/18/2024 2250   TCO2 25 07/14/2024 1908   ACIDBASEDEF 0.8 07/18/2024 2250   O2SAT 98.3 07/18/2024 2250     Coagulation Profile: Recent Labs  Lab 07/14/24 1904  INR 1.0    Cardiac Enzymes: No results for input(s): CKTOTAL, CKMB, CKMBINDEX, TROPONINI in the last 168 hours.  HbA1C: Hgb A1c MFr Bld  Date/Time Value Ref Range Status  07/16/2024 06:01 AM 5.6 4.8 - 5.6 % Final    Comment:    (NOTE) Diagnosis of Diabetes The following HbA1c ranges recommended by the American Diabetes Association (ADA) may be used as an aid in the diagnosis of diabetes mellitus.  Hemoglobin             Suggested A1C NGSP%              Diagnosis  <5.7                   Non Diabetic  5.7-6.4                Pre-Diabetic  >6.4                   Diabetic  <7.0                   Glycemic control for                       adults with diabetes.      CBG: Recent Labs  Lab 07/14/24 1903 07/18/24 2021 07/18/24 2106 07/18/24 2318 07/19/24 1956  GLUCAP 112* 54* 139* 98 103*    Review of Systems:   Unobtainable 2/2 obtunded mental status.   Past Medical History:  He,  has a past medical history of Hypertension.   Surgical History:  No past surgical history on file.   Social History:   reports that  he has quit  smoking. He has never used smokeless tobacco. He reports current alcohol use of about 1.0 standard drink of alcohol per week. He reports that he does not use drugs.   Family History:  His family history is not on file.   Allergies Allergies  Allergen Reactions   Clonidine  Other (See Comments)    Weakness, lower extremity paralysis feeling   Penicillins Shortness Of Breath    Patient reports reaction occurred as a teenager and he experienced shortness of breath not requiring hospitalization.    Amlodipine Swelling   Lisinopril Swelling   Metoprolol Swelling   Tylenol  [Acetaminophen ]      Home Medications  Prior to Admission medications   Medication Sig Start Date End Date Taking? Authorizing Provider  FAMOTIDINE  PO Take 1 tablet by mouth daily.   Yes [provider]  naproxen sodium (ALEVE) 220 MG tablet Take 440 mg by mouth.   Yes [provider]     Critical care time: 

## 2024-07-19 NOTE — Progress Notes (Signed)
 Patient was seen for change in status.   He was admitted 5 days ago with ICH, developed persistent fevers and was started on antibiotics for potential aspiration pneumonia.    Last night, he had a significant decline in his mental status, ongoing fevers, tachycardia, and tachypnea.  Head CT at that time was concerning for possible mild interval worsening in hydrocephalus, lactic acid was elevated, and leukocytosis had developed. Antibiotics were broadened, he was given fluid boluses, and seen by neurology with recommendation for repeat head CT this morning.  Head CT scheduled for this morning was never performed, his metabolic derangements worsened, and he has now become obtunded.  Patient is currently febrile, tachycardic, tachypneic, hypotensive, and not responding to pain.  Fluid boluses have been started by rapid response and blood pressure is improving, but the patient does not appear to be protecting his airway at this time and PCCM was consulted.  Appreciate Dr. Layman of PCCM reviewing the case; they will be evaluating the patient for transfer back to the ICU.

## 2024-07-19 NOTE — Significant Event (Signed)
 Rapid Response Event Note   Reason for Call :  Called urgently to bedside d/t tachycardia/tachypneic/fever with hypotension and unresponsiveness.   Initial Focused Assessment:  Pt lying in bed with eyes closed. His breathing is fast/labored. He is completely unresponsive to all stimuli. Pupils 2 and equal. Deep suctioning performed with no gag reflex.  Lungs with rhonchi t/o. ABD soft. Skin hot/diaphoretic.   T-100.7, HR-120s, BP-69/50, RR-40s, SpO2-100% on RA   1L LR bolus started(additional 1L LR added d/t little improvement in BP)  Dr. Charlton to bedside. PCCM consulted.   BP improved to 100s with no change in mental status aside from pt now having minimal gag during NT suctioning.   Dr. Layman to bedside to assess.  Pt NT suctioned, given 3 amps bicarb, transported to CT, and then to 58M05.  Pt with no gag during NT suction on arrival to 58M. Dr. Layman back to bedside and pt intubated.   Interventions:  2L LR bolus CBG-103 Deep oral suctioning-thick tan secretions NTS-thick tan secretions 3 amps bicarb CMP/CBC/LA CT head done(was previously ordered to be done at 8AM) Tx to ICU(58M05) Plan of Care:  Pt to 58M05 and intubated.  Event Summary:   MD Notified: Dr. Rolley to bedside, PCCM consulted-Dr. Layman to bedside Call [phone] Arrival [phone] End [id number]  Tish Graeme Piety, RN

## 2024-07-19 NOTE — Progress Notes (Signed)
 Entered PT's room approx 20:30 to perform exam as well as evaluate and treat tachycardia.  PT breathing labored, tachypneic, unresponsive to neuro exam, negative cough and gag.  PT hypotensive on BUE.  This nurse immediately had charge nurse activate rapid response, code cart brought into room, pads placed on anterior and posterior chest and connected to zoll.  Mindy RN from RRT came to room LR bolus started, ran 2-1L bags simultaneously.  PT appeared to be fluid responsive as evidenced by increase in BP (see flow sheet).  Dr. Charlton (hospitalist) came to room to examine PT, still with poor exam and contacted CCM to examine PT. CCM recommended to transfer to ICU due to poor exam, poor vitals, and poor lab results.  Inquired as to why repeat Head CT that was scheduled for 0800 was not completed, advised I saw an epic chat from CT sent to me at 07:40, however, as I work night shift I was already gone and logged out.  Unsure why dayteam did not complete scheduled head ct.  This nurse accompanied by RT and Mindy RN took PT for stat head ct and then took PT to ICU, report given to RN assuming care.

## 2024-07-19 NOTE — Progress Notes (Addendum)
 PROGRESS NOTE  Lawrence Barnett FMW:[id number] DOB: 12-Jan-1951 DOA: 07/14/2024 PCP: Vicci Mliss SAUNDERS, FNP   LOS: 5 days   Brief narrative:  Lawrence Barnett is a 73 y.o. male with past medical history of HTN, arthritis presented to the hospital after being found by his nephew lying on the floor with left-sided weakness.  He uses a lot of Advil  at baseline for arthritis and pain.  EMS was called in and patient was brought into the hospital for acute stroke and patient was noted to have blood blood pressure elevated more than 200 systolic.  In the ED, labs were remarkable for mild hypokalemia with potassium of 3.3.  Urine drug screen was negative.  CT head scan was done which showed 7 mL right basal ganglia intracranial hemorrhage with intraventricular extension.  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 Patient was initially admitted to the ICU and repeat scans were done.  Blood pressure control with goal systolic at 130-1 50 and was initially on Cleviprex and labetalol.  Lipid profile with LDL of 73.  Hemoglobin A1c of 5.6.  MRI of the brain showed acute intraparenchymal hemorrhage in the right thalamus measuring 2.8 x 2.7 x 3.3 cm with intraventricular extension.  CT angiogram of the head and neck without large vessel occlusion.  Seen by speech therapy and was on dysphagia 3 diet but had concerns for an ongoing aspiration so currently NPO.  Has NG tube in place will start on tube feeding.  Physical therapy has evaluated the patient and at this time recommendation is CIR. CT head scan repeat has been ordered as well.  Hypertension.  Hypertension goal systolic 130-150.  Currently on hydralazine  as needed, labetalol,  hydralazine  and losartan.  Hold HCTZ.  Blood pressure today at 130/88.  Continue strict  monitoring.  Hypernatremia.  Sodium of 153 today.  Has been started on D5 infusion.  Will monitor BMP every 12 hourly.  Add G-tube with free water flushes.  Will get intake and output charting.  Nutrition.  Will get nutrition on board for tube feeding initiation.  Hypokalemia.  Latest potassium of 3.6.  Monitor closely and replenish as necessary.  Severe sepsis likely from aspiration pneumonia patient has signs of severe sepsis including fever, tachycardia with leukocytosis and elevated lactate with concerns for aspiration pneumonia.  Rapid response was called in yesterday.  Antibiotics have been broadened to linezolid and cefepime .  Still on room air.  High risk for aspiration.  Continue IV fluid hydration.  Keep n.p.o. for concerns for aspiration.  Has NG tube in place so we will consider tube feeding with water flushes..  Superficial rash over the left knee and forearm.  Appears to be irritation related.  Could be secondary to adhesive tape.  Will continue to observe.  No itching.  DVT prophylaxis: heparin injection 5,000 Units Start: 07/16/24 1400 SCD's Start: 07/14/24 1909   Disposition: CIR as per PT evaluation  Status is: Inpatient Remains inpatient appropriate because: Pending clinical improvement, need for CIR, new fevers requiring IV antibiotics    Code Status:     Code Status: Full Code  Family Communication:  Spoke with the patient's niece at bedside on 07/19/2024  and spoke about the condition of the patient.  Patient's niece is aware that condition might deteriorate.  Overall prognosis is guarded at this time.  Consultants: Neurology  Procedures: None  Anti-infectives: Linezolid and cefepime   Anti-infectives (From admission, onward)    Start     Dose/Rate Route Frequency Ordered Stop   07/19/24 2200  linezolid (ZYVOX) tablet 600 mg        600 mg Per Tube Every 12 hours 07/19/24 1030     07/18/24 2330  vancomycin  (VANCOREADY) IVPB 1250 mg/250 mL        1,250  mg 166.7 mL/hr over 90 Minutes Intravenous  Once 07/18/24 2235 07/19/24 0209   07/18/24 2330  ceFEPIme  (MAXIPIME ) 2 g in sodium chloride  0.9 % 100 mL IVPB        2 g 200 mL/hr over 30 Minutes Intravenous Every 12 hours 07/18/24 2235     07/18/24 2245  metroNIDAZOLE (FLAGYL) tablet 500 mg        500 mg Per Tube Every 12 hours 07/18/24 2154     07/18/24 2234  vancomycin  variable dose per unstable renal function (pharmacist dosing)  Status:  Discontinued         Does not apply See admin instructions 07/18/24 2235 07/19/24 1030   07/18/24 1000  metroNIDAZOLE (FLAGYL) tablet 500 mg  Status:  Discontinued        500 mg Oral Every 12 hours 07/18/24 0820 07/18/24 2154   07/18/24 0915  cefTRIAXone (ROCEPHIN) 2 g in sodium chloride  0.9 % 100 mL IVPB  Status:  Discontinued        2 g 200 mL/hr over 30 Minutes Intravenous Every 24 hours 07/18/24 0820 07/18/24 2137      Subjective: Today, patient was seen and examined at bedside.  Mildly interactive but appears somnolent.  Denies any pain.  No report of nausea vomiting or diarrhea.  Objective: Vitals:   07/19/24 0542 07/19/24 0900  BP: (!) 127/107 130/88  Pulse:  (!) 123  Resp:  18  Temp:  99.9 F (37.7 C)  SpO2:  100%    Intake/Output Summary (Last 24 hours) at 07/19/2024 1053 Last data filed at 07/19/2024 0554 Gross per 24 hour  Intake 1070.14 ml  Output 450 ml  Net 620.14 ml   Filed Weights   07/15/24 0100 07/18/24 0500 07/19/24 0500  Weight: 63.5 kg 60.2 kg 59.4 kg   Body mass index is 17.76 kg/m.   Physical Exam:  GENERAL: Patient is slightly somnolent but awakes on command..   Not in obvious distress.  Elderly male, appears weak and deconditioned. HENT: No scleral pallor or icterus. Pupils equally reactive to light. Oral mucosa is moist NECK: is supple, no gross swelling noted. CHEST: Diminished breath sounds bilaterally.  Coarse breath sounds noted. CVS: S1 and S2 heard, no murmur.  Tachycardia noted. ABDOMEN: Soft,  non-tender, bowel sounds are present.  External urinary catheter EXTREMITIES: No edema. CNS: Somnolent but follows commands with left-sided weakness noted.  Left facial droop.  Appears thinly built. SKIN: warm and dry without rashes.  Mild erythematous area over the left dorsal hand and knee with some blanching but no pruritus.  Data Review: I have personally reviewed the following laboratory data and studies,  CBC: Recent Labs  Lab 07/14/24 1904 07/14/24 1908 07/16/24 0601 07/17/24 0438 07/18/24 0450 07/18/24 2051 07/19/24 0255  WBC 6.2  --  5.8 8.7 10.3 11.8* 16.2*  NEUTROABS 3.5  --   --   --   --   --   --   HGB 15.3   < > 16.8 16.7 17.7* 17.2* 17.0  HCT 46.7   < > 50.7 50.3  53.7* 54.1* 52.1*  MCV 81.6  --  83.0 81.9 83.5 84.4 84.6  PLT 200  --  226 216 202 238 220   < > = values in this interval not displayed.   Basic Metabolic Panel: Recent Labs  Lab 07/16/24 0601 07/17/24 0438 07/18/24 0450 07/18/24 2051 07/19/24 0255  NA 142 145 148* 152* 153*  K 4.3 4.1 4.1 4.0 3.6  CL 108 111 115* 119* 119*  CO2 24 20* 17* 18* 14*  GLUCOSE 97 93 92 99 100*  BUN 12 15 24* 36* 35*  CREATININE 1.03 0.94 0.99 1.35* 1.37*  CALCIUM 9.9 9.8 9.8 9.8 9.5   Liver Function Tests: Recent Labs  Lab 07/14/24 1904 07/18/24 2051  AST 29 87*  ALT 18 39  ALKPHOS 140* 95  BILITOT 0.8 3.3*  PROT 8.2* 8.5*  ALBUMIN 3.6 3.0*   No results for input(s): LIPASE, AMYLASE in the last 168 hours. Recent Labs  Lab 07/19/24 0255  AMMONIA 59*   Cardiac Enzymes: No results for input(s): CKTOTAL, CKMB, CKMBINDEX, TROPONINI in the last 168 hours. BNP (last 3 results) No results for input(s): BNP in the last 8760 hours.  ProBNP (last 3 results) No results for input(s): PROBNP in the last 8760 hours.  CBG: Recent Labs  Lab 07/14/24 1903 07/18/24 2021 07/18/24 2106 07/18/24 2318  GLUCAP 112* 54* 139* 98   Recent Results (from the past 240 hours)  MRSA Next Gen by PCR,  Nasal     Status: None   Collection Time: 07/15/24  1:15 AM   Specimen: Nasal Mucosa; Nasal Swab  Result Value Ref Range Status   MRSA by PCR Next Gen NOT DETECTED NOT DETECTED Final    Comment: (NOTE) The GeneXpert MRSA Assay (FDA approved for NASAL specimens only), is one component of a comprehensive MRSA colonization surveillance program. It is not intended to diagnose MRSA infection nor to guide or monitor treatment for MRSA infections. Test performance is not FDA approved in patients less than 80 years old. Performed at [facility], [address] [place], [place] [address]   Culture, blood (Routine X 2) w Reflex to ID Panel     Status: None (Preliminary result)   Collection Time: 07/18/24  8:51 PM   Specimen: BLOOD RIGHT ARM  Result Value Ref Range Status   Specimen Description BLOOD RIGHT ARM  Final   Special Requests   Final    BOTTLES DRAWN AEROBIC AND ANAEROBIC Blood Culture results may not be optimal due to an inadequate volume of blood received in culture bottles   Culture   Final    NO GROWTH < 12 HOURS Performed at [facility], [address], [place], [place] [address]    Report Status PENDING  Incomplete  Culture, blood (Routine X 2) w Reflex to ID Panel     Status: None (Preliminary result)   Collection Time: 07/18/24  8:53 PM   Specimen: BLOOD RIGHT ARM  Result Value Ref Range Status   Specimen Description BLOOD RIGHT ARM  Final   Special Requests   Final    BOTTLES DRAWN AEROBIC AND ANAEROBIC Blood Culture results may not be optimal due to an inadequate volume of blood received in culture bottles   Culture   Final    NO GROWTH < 12 HOURS Performed at [facility], [address], [place], [place] [address]    Report Status PENDING  Incomplete     Studies: CT HEAD  WO CONTRAST ( ) Result Date: 07/18/2024 EXAM: CT HEAD WITHOUT CONTRAST 07/18/2024 11:04:43 PM TECHNIQUE: CT of the head was performed without the administration of  intravenous contrast. Automated exposure control, iterative reconstruction, and/or weight based adjustment of the mA/kV was utilized to reduce the radiation dose to as low as reasonably achievable. COMPARISON: CT head Jul 15, 2024 CLINICAL HISTORY: Mental status change, unknown cause FINDINGS: BRAIN AND VENTRICLES: Slight interval decrease in size of the recent right thalamic hemorrhage, which now measures approximately 3.3 x 2.5 x 2.2 cm. Intraventricular hemorrhage is also decreased. 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 No evidence of acute infarct. No extra-axial collection. No mass effect or midline shift. Patchy white matter hypodensiteis are compatible with chronic microvascular ischemic change. ORBITS: No acute abnormality. 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 Mastoid air cells are clear. SOFT TISSUES AND SKULL: No skull fracture. Findings will be conveyed to the clinical team by the radiology assistant and documented in PACs/Clario. IMPRESSION: 1. 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 2. Mild interval decrease in right thalamic intraparenchymal hemorrhage and intraventricular hemorrhage. Electronically signed by: Gilmore Molt MD 07/18/2024 11:30 PM EST RP Workstation: [id number]   DG CHEST PORT 1 VIEW Result Date: 07/18/2024 EXAM: 1 VIEW(S) XRAY OF THE CHEST 07/18/2024 08:57:00 PM COMPARISON: None available. CLINICAL HISTORY: Sepsis (HCC) [id number]; Respiratory distress 141876. FINDINGS: LINES, TUBES AND DEVICES: Intertrochanterics below the hemidiaphragm with the tip inside the cord collimated off view. LUNGS AND PLEURA: Left base atelectasis. No pleural effusion. No pneumothorax. HEART AND MEDIASTINUM: No  acute abnormality of the cardiac and mediastinal silhouettes. BONES AND SOFT TISSUES: No acute osseous abnormality. IMPRESSION: 1. No acute findings. Electronically signed by: Morgane Naveau MD 07/18/2024 09:06 PM EST RP Workstation: [id number]   DG Abd Portable 1V Result Date: 07/18/2024 EXAM: 1 VIEW XRAY OF THE ABDOMEN 07/18/2024 02:37:00 PM COMPARISON: None available. CLINICAL HISTORY: Nasogastric tube present. FINDINGS: LINES, TUBES AND DEVICES: Nasogastric tube has been advanced, tip in the region of the gastric antrum. BOWEL: Nonobstructive bowel gas pattern. Residual contrast in the visualized colon. SOFT TISSUES: No opaque urinary calculi. BONES: No acute osseous abnormality. IMPRESSION: 1. Nasogastric tube tip in the region of the gastric antrum. 2. Residual contrast within the colon. Electronically signed by: Katheleen Faes MD 07/18/2024 02:43 PM EST RP Workstation: [id number]   DG Abd Portable 1V Result Date: 07/18/2024 EXAM: 1 VIEW XRAY OF THE ABDOMEN 07/18/2024 11:53:00 AM COMPARISON: None available. CLINICAL HISTORY: Nasogastric tube present. FINDINGS: LINES, TUBES AND DEVICES: Enteric tube in place with tip overlying the expected region of the gastric lumen and side port overlying the expected region of the gastroesophageal junction. BOWEL: Nonobstructive bowel gas pattern. Enteric contrast noted in the colon with colonic diverticula. SOFT TISSUES: No opaque urinary calculi. BONES: No acute osseous abnormality. IMPRESSION: 1. No acute findings. 2. Enteric tube in appropriate position with side port at the gastroesophageal junction; consider advancing the tube to position the side port within the gastric lumen. Electronically signed by: Dayne Hassell MD 07/18/2024 01:16 PM EST RP Workstation: [id number]      Vernal Alstrom, MD  [facility] 07/19/2024  If 7PM-7AM, please contact night-coverage

## 2024-07-19 NOTE — Plan of Care (Signed)
  Problem: Education: Goal: Knowledge of disease or condition will improve Outcome: Not Progressing Goal: Knowledge of secondary prevention will improve (MUST DOCUMENT ALL) Outcome: Not Progressing Goal: Knowledge of patient specific risk factors will improve (DELETE if not current risk factor) Outcome: Not Progressing   Problem: Intracerebral Hemorrhage Tissue Perfusion: Goal: Complications of Intracerebral Hemorrhage will be minimized Outcome: Not Progressing   Problem: Health Behavior/Discharge Planning: Goal: Ability to manage health-related needs will improve Outcome: Not Progressing   Problem: Nutrition: Goal: Risk of aspiration will decrease Outcome: Not Progressing Goal: Dietary intake will improve Outcome: Not Progressing   Problem: Clinical Measurements: Goal: Will remain free from infection Outcome: Not Progressing

## 2024-07-19 NOTE — Progress Notes (Signed)
 Cross covering ICU physician.   Called to update NOK listed in chart Mr Juan who reports he will make further calls to other family members and will make an effort to come see pt this evening. He expressed understanding that pt is now intubated and on life support.

## 2024-07-19 NOTE — Progress Notes (Addendum)
 eLink Physician-Brief Progress Note Patient Name: Lawrence Barnett DOB: 01-03-1951 MRN: [id number]   Date of Service  07/19/2024  HPI/Events of Note  73 year old male with essential hypertension that initially presented with right basal ganglia ICH with intraventricular extension complicated by superimposed aspiration pneumonia that is transferred back to the ICU for decreased level of consciousness requiring intubation for airway protection.  Patient is tachypneic, tachycardic, and was recently hypotensive responsive to bicarbonate and fluids.  Saturating 92% with sonorous with nasal cannula in place.  Minimal gag to NT suctioning and deep oropharyngeal suctioning.  Results consistent with hypoglycemia, anion gap metabolic acidosis, kidney injury, hyperammonemia lactic acidosis, leukocytosis.  Repeat CT head reviewed-looks fairly stable compared to yesterday  eICU Interventions  Intubated for airway protection, radiograph reviewed.  Daily spontaneous awakening/breathing trials.  Limit sedation and analgesia given baseline depressed LOC  Note the addition of lactulose  Bicarb infusion  DVT prophylaxis with heparin subcutaneous, maintain given no substantial change in ICH GI prophylaxis with famotidine     2354 -Will replete electrolytes with formal BMP in the morning.  Reviewed ABG, reduced rate and volume to 6 cc/kg  Intervention Category Intermediate Interventions: Respiratory distress - evaluation and management, new patient  Moshe Wenger 07/19/2024, 10:15 PM

## 2024-07-20 ENCOUNTER — Inpatient Hospital Stay (HOSPITAL_COMMUNITY)

## 2024-07-20 DIAGNOSIS — G919 Hydrocephalus, unspecified: Secondary | ICD-10-CM

## 2024-07-20 DIAGNOSIS — E870 Hyperosmolality and hypernatremia: Secondary | ICD-10-CM | POA: Diagnosis not present

## 2024-07-20 DIAGNOSIS — J9601 Acute respiratory failure with hypoxia: Secondary | ICD-10-CM

## 2024-07-20 DIAGNOSIS — E7220 Disorder of urea cycle metabolism, unspecified: Secondary | ICD-10-CM

## 2024-07-20 DIAGNOSIS — I10 Essential (primary) hypertension: Secondary | ICD-10-CM | POA: Diagnosis not present

## 2024-07-20 DIAGNOSIS — E785 Hyperlipidemia, unspecified: Secondary | ICD-10-CM | POA: Diagnosis not present

## 2024-07-20 DIAGNOSIS — E876 Hypokalemia: Secondary | ICD-10-CM | POA: Diagnosis not present

## 2024-07-20 DIAGNOSIS — I610 Nontraumatic intracerebral hemorrhage in hemisphere, subcortical: Secondary | ICD-10-CM | POA: Diagnosis not present

## 2024-07-20 DIAGNOSIS — I615 Nontraumatic intracerebral hemorrhage, intraventricular: Secondary | ICD-10-CM | POA: Diagnosis not present

## 2024-07-20 LAB — CBC
HCT: 54 % — ABNORMAL HIGH (ref 39.0–52.0)
HCT: 52.9 % — ABNORMAL HIGH (ref 39.0–52.0)
Hemoglobin: 17.1 g/dL — ABNORMAL HIGH (ref 13.0–17.0)
Hemoglobin: 17.2 g/dL — ABNORMAL HIGH (ref 13.0–17.0)
MCH: 27.1 pg (ref 26.0–34.0)
MCH: 27.2 pg (ref 26.0–34.0)
MCHC: 31.7 g/dL (ref 30.0–36.0)
MCHC: 32.5 g/dL (ref 30.0–36.0)
MCV: 85.7 fL (ref 80.0–100.0)
MCV: 83.6 fL (ref 80.0–100.0)
Platelets: 232 K/uL (ref 150–400)
Platelets: 272 K/uL (ref 150–400)
RBC: 6.3 MIL/uL — ABNORMAL HIGH (ref 4.22–5.81)
RBC: 6.33 MIL/uL — ABNORMAL HIGH (ref 4.22–5.81)
RDW: 16.5 % — ABNORMAL HIGH (ref 11.5–15.5)
RDW: 15.9 % — ABNORMAL HIGH (ref 11.5–15.5)
WBC: 14.6 K/uL — ABNORMAL HIGH (ref 4.0–10.5)
WBC: 16.8 K/uL — ABNORMAL HIGH (ref 4.0–10.5)
nRBC: 0.1 % (ref 0.0–0.2)
nRBC: 0.4 % — ABNORMAL HIGH (ref 0.0–0.2)

## 2024-07-20 LAB — BLOOD GAS, VENOUS
Acid-Base Excess: 5.8 mmol/L — ABNORMAL HIGH (ref 0.0–2.0)
Bicarbonate: 29.8 mmol/L — ABNORMAL HIGH (ref 20.0–28.0)
O2 Saturation: 84.9 %
Patient temperature: 37.3
pCO2, Ven: 40 mmHg — ABNORMAL LOW (ref 44–60)
pH, Ven: 7.48 — ABNORMAL HIGH (ref 7.25–7.43)
pO2, Ven: 55 mmHg — ABNORMAL HIGH (ref 32–45)

## 2024-07-20 LAB — POCT I-STAT 7, (LYTES, BLD GAS, ICA,H+H)
Acid-Base Excess: 7 mmol/L — ABNORMAL HIGH (ref 0.0–2.0)
Bicarbonate: 27.6 mmol/L (ref 20.0–28.0)
Calcium, Ion: 1.24 mmol/L (ref 1.15–1.40)
HCT: 53 % — ABNORMAL HIGH (ref 39.0–52.0)
Hemoglobin: 18 g/dL — ABNORMAL HIGH (ref 13.0–17.0)
O2 Saturation: 100 %
Potassium: 3.2 mmol/L — ABNORMAL LOW (ref 3.5–5.1)
Sodium: 157 mmol/L — ABNORMAL HIGH (ref 135–145)
TCO2: 28 mmol/L (ref 22–32)
pCO2 arterial: 28.4 mmHg — ABNORMAL LOW (ref 32–48)
pH, Arterial: 7.595 — ABNORMAL HIGH (ref 7.35–7.45)
pO2, Arterial: 184 mmHg — ABNORMAL HIGH (ref 83–108)

## 2024-07-20 LAB — T4, FREE: Free T4: 2.29 ng/dL — ABNORMAL HIGH (ref 0.61–1.12)

## 2024-07-20 LAB — COMPREHENSIVE METABOLIC PANEL WITH GFR
ALT: 41 U/L (ref 0–44)
ALT: 45 U/L — ABNORMAL HIGH (ref 0–44)
AST: 107 U/L — ABNORMAL HIGH (ref 15–41)
AST: 122 U/L — ABNORMAL HIGH (ref 15–41)
Albumin: 2.1 g/dL — ABNORMAL LOW (ref 3.5–5.0)
Albumin: 2.5 g/dL — ABNORMAL LOW (ref 3.5–5.0)
Alkaline Phosphatase: 75 U/L (ref 38–126)
Alkaline Phosphatase: 78 U/L (ref 38–126)
Anion gap: 14 (ref 5–15)
Anion gap: 16 — ABNORMAL HIGH (ref 5–15)
BUN: 40 mg/dL — ABNORMAL HIGH (ref 8–23)
BUN: 41 mg/dL — ABNORMAL HIGH (ref 8–23)
CO2: 22 mmol/L (ref 22–32)
CO2: 24 mmol/L (ref 22–32)
Calcium: 9.2 mg/dL (ref 8.9–10.3)
Calcium: 9.4 mg/dL (ref 8.9–10.3)
Chloride: 119 mmol/L — ABNORMAL HIGH (ref 98–111)
Chloride: 114 mmol/L — ABNORMAL HIGH (ref 98–111)
Creatinine, Ser: 1.75 mg/dL — ABNORMAL HIGH (ref 0.61–1.24)
Creatinine, Ser: 1.67 mg/dL — ABNORMAL HIGH (ref 0.61–1.24)
GFR, Estimated: 40 mL/min — ABNORMAL LOW (ref >60)
GFR, Estimated: 42 mL/min — ABNORMAL LOW (ref >60)
Glucose, Bld: 110 mg/dL — ABNORMAL HIGH (ref 70–99)
Glucose, Bld: 93 mg/dL (ref 70–99)
Potassium: 3.6 mmol/L (ref 3.5–5.1)
Potassium: 3.2 mmol/L — ABNORMAL LOW (ref 3.5–5.1)
Sodium: 155 mmol/L — ABNORMAL HIGH (ref 135–145)
Sodium: 154 mmol/L — ABNORMAL HIGH (ref 135–145)
Total Bilirubin: 2.7 mg/dL — ABNORMAL HIGH (ref 0.0–1.2)
Total Bilirubin: 2.7 mg/dL — ABNORMAL HIGH (ref 0.0–1.2)
Total Protein: 6.8 g/dL (ref 6.5–8.1)
Total Protein: 7.8 g/dL (ref 6.5–8.1)

## 2024-07-20 LAB — BASIC METABOLIC PANEL WITH GFR
Anion gap: 17 — ABNORMAL HIGH (ref 5–15)
BUN: 50 mg/dL — ABNORMAL HIGH (ref 8–23)
CO2: 24 mmol/L (ref 22–32)
Calcium: 9.2 mg/dL (ref 8.9–10.3)
Chloride: 114 mmol/L — ABNORMAL HIGH (ref 98–111)
Creatinine, Ser: 2.38 mg/dL — ABNORMAL HIGH (ref 0.61–1.24)
GFR, Estimated: 28 mL/min — ABNORMAL LOW (ref >60)
Glucose, Bld: 140 mg/dL — ABNORMAL HIGH (ref 70–99)
Potassium: 3.3 mmol/L — ABNORMAL LOW (ref 3.5–5.1)
Sodium: 155 mmol/L — ABNORMAL HIGH (ref 135–145)

## 2024-07-20 LAB — GLUCOSE, CAPILLARY
Glucose-Capillary: 114 mg/dL — ABNORMAL HIGH (ref 70–99)
Glucose-Capillary: 128 mg/dL — ABNORMAL HIGH (ref 70–99)
Glucose-Capillary: 75 mg/dL (ref 70–99)
Glucose-Capillary: 128 mg/dL — ABNORMAL HIGH (ref 70–99)
Glucose-Capillary: 149 mg/dL — ABNORMAL HIGH (ref 70–99)
Glucose-Capillary: 119 mg/dL — ABNORMAL HIGH (ref 70–99)

## 2024-07-20 LAB — LACTIC ACID, PLASMA
Lactic Acid, Venous: 3.1 mmol/L (ref 0.5–1.9)
Lactic Acid, Venous: 2.4 mmol/L (ref 0.5–1.9)
Lactic Acid, Venous: 1.5 mmol/L (ref 0.5–1.9)

## 2024-07-20 LAB — PHOSPHORUS: Phosphorus: 3.6 mg/dL (ref 2.5–4.6)

## 2024-07-20 LAB — MAGNESIUM: Magnesium: 2.2 mg/dL (ref 1.7–2.4)

## 2024-07-20 MED ORDER — HYDROCORTISONE SOD SUC (PF) 500 MG IJ SOLR
300.0000 mg | Freq: Once | INTRAMUSCULAR | Status: DC
  Filled 2024-07-20: qty 2.4

## 2024-07-20 MED ORDER — HYDROCORTISONE SOD SUC (PF) 100 MG IJ SOLR
100.0000 mg | Freq: Three times a day (TID) | INTRAMUSCULAR | Status: DC
  Administered 2024-07-20 – 2024-07-27 (×22): 100 mg via INTRAVENOUS
  Filled 2024-07-20 (×22): qty 2

## 2024-07-20 MED ORDER — LACTATED RINGERS IV BOLUS
500.0000 mL | Freq: Once | INTRAVENOUS | Status: AC
  Administered 2024-07-20: 500 mL via INTRAVENOUS

## 2024-07-20 MED ORDER — OSMOLITE 1.5 CAL PO LIQD
1000.0000 mL | ORAL | Status: DC
  Administered 2024-07-20 – 2024-08-03 (×13): 1000 mL
  Filled 2024-07-20 (×12): qty 1000

## 2024-07-20 MED ORDER — CHLORHEXIDINE GLUCONATE CLOTH 2 % EX PADS
6.0000 | MEDICATED_PAD | Freq: Every day | CUTANEOUS | Status: DC
  Administered 2024-07-19: 6 via TOPICAL

## 2024-07-20 MED ORDER — LACTATED RINGERS IV SOLN: INTRAVENOUS | Status: AC

## 2024-07-20 MED ORDER — PROPRANOLOL HCL 10 MG PO TABS
10.0000 mg | ORAL_TABLET | Freq: Three times a day (TID) | ORAL | Status: DC
  Filled 2024-07-20 (×2): qty 1

## 2024-07-20 MED ORDER — POTASSIUM CHLORIDE 20 MEQ PO PACK: 60.0000 meq | PACK | Freq: Once | ORAL | Status: DC

## 2024-07-20 MED ORDER — POTASSIUM IODIDE (EXPECTORANT) 1 GM/ML PO SOLN
250.0000 mg | Freq: Four times a day (QID) | ORAL | Status: DC
  Administered 2024-07-20 – 2024-07-29 (×35): 250 mg
  Filled 2024-07-20 (×40): qty 0.25

## 2024-07-20 MED ORDER — METHIMAZOLE 10 MG PO TABS
10.0000 mg | ORAL_TABLET | Freq: Three times a day (TID) | ORAL | Status: DC
  Administered 2024-07-20 – 2024-07-23 (×8): 10 mg
  Filled 2024-07-20 (×9): qty 1

## 2024-07-20 MED ORDER — PROPRANOLOL HCL 10 MG PO TABS
10.0000 mg | ORAL_TABLET | Freq: Three times a day (TID) | ORAL | Status: DC
  Administered 2024-07-20 (×2): 10 mg
  Filled 2024-07-20 (×3): qty 1

## 2024-07-20 MED ORDER — HYDROCORTISONE SOD SUC (PF) 100 MG IJ SOLR
300.0000 mg | Freq: Once | INTRAMUSCULAR | Status: AC
  Administered 2024-07-20: 300 mg via INTRAVENOUS
  Filled 2024-07-20: qty 6

## 2024-07-20 MED ORDER — ORAL CARE MOUTH RINSE: 15.0000 mL | OROMUCOSAL | Status: DC | PRN

## 2024-07-20 MED ORDER — POTASSIUM CHLORIDE 20 MEQ PO PACK: 40.0000 meq | PACK | Freq: Once | ORAL | Status: DC

## 2024-07-20 MED ORDER — POTASSIUM IODIDE (EXPECTORANT) 1 GM/ML PO SOLN
250.0000 mg | Freq: Four times a day (QID) | ORAL | Status: DC
  Filled 2024-07-20 (×3): qty 0.25

## 2024-07-20 MED ORDER — FREE WATER
300.0000 mL | Status: DC
  Administered 2024-07-20 – 2024-07-22 (×13): 300 mL

## 2024-07-20 MED ORDER — SODIUM CHLORIDE 0.9 % IV SOLN
2.0000 g | INTRAVENOUS | Status: DC
  Administered 2024-07-20 – 2024-07-21 (×2): 2 g via INTRAVENOUS
  Filled 2024-07-20 (×2): qty 12.5

## 2024-07-20 MED ORDER — ORAL CARE MOUTH RINSE
15.0000 mL | OROMUCOSAL | Status: DC
  Administered 2024-07-20 – 2024-08-03 (×172): 15 mL via OROMUCOSAL

## 2024-07-20 MED ORDER — NOREPINEPHRINE 4 MG/250ML-% IV SOLN: 0.0000 ug/min | INTRAVENOUS | Status: DC

## 2024-07-20 MED ORDER — NOREPINEPHRINE 4 MG/250ML-% IV SOLN
INTRAVENOUS | Status: AC
Start: 2024-07-20 — End: 2024-07-20
  Administered 2024-07-20: 2 ug/min via INTRAVENOUS
  Filled 2024-07-20: qty 250

## 2024-07-20 MED ORDER — CHLORHEXIDINE GLUCONATE CLOTH 2 % EX PADS
6.0000 | MEDICATED_PAD | Freq: Every day | CUTANEOUS | Status: DC
  Administered 2024-07-21 – 2024-08-03 (×15): 6 via TOPICAL

## 2024-07-20 MED ORDER — SODIUM CHLORIDE 0.9 % IV SOLN: 250.0000 mL | INTRAVENOUS | Status: AC

## 2024-07-20 MED ORDER — METHIMAZOLE 10 MG PO TABS
10.0000 mg | ORAL_TABLET | Freq: Three times a day (TID) | ORAL | Status: DC
  Administered 2024-07-20: 10 mg
  Filled 2024-07-20 (×3): qty 1

## 2024-07-20 MED ORDER — POTASSIUM CHLORIDE 20 MEQ PO PACK
40.0000 meq | PACK | Freq: Once | ORAL | Status: AC
  Administered 2024-07-20: 40 meq
  Filled 2024-07-20: qty 2

## 2024-07-20 NOTE — Progress Notes (Signed)
 Initial Nutrition Assessment  DOCUMENTATION CODES:  Severe malnutrition in context of social or environmental circumstances, Underweight  INTERVENTION:  TF via Cortrak: Start Osmolite 1.5 at 20ml/hr and advance by 10ml q8h to goal rate of 57ml/hr ( per day) Provides 1800 kcal, 75g protein and free water daily  Free water flush 300ml q4h (1800ml total daily) Total free water (TF+FWF)- 2714ml   Monitor magnesium, potassium, and phosphorus daily for at least 3 days, MD to replete as needed, as pt is at risk for refeeding syndrome given severe malnutrition.   Add Thiamine  100 mg daily for 7 days  NUTRITION DIAGNOSIS:  Severe Malnutrition related to social / environmental circumstances as evidenced by severe fat depletion, severe muscle depletion.  GOAL:  Patient will meet greater than or equal to 90% of their needs  MONITOR:  Vent status, Labs, Weight trends, TF tolerance  REASON FOR ASSESSMENT:  Ventilator, Consult Enteral/tube feeding initiation and management  ASSESSMENT:  Pt initially admitted 11/11 with left sided weakness and right gaze d/t right basal ganglia ICH with intraventricular extension. Throughout admission, pt became obtunded, tachycardic and tachypneic requiring transfer to ICU and subsequent intubation. PMH significant for HTN.   11/11: admitted with ICH 11/16: transferred back to ICU, intubated; repeat CT head- slight increase in IVH 11/17: Cortrak placed  Patient is currently intubated on ventilator support MV: 15 L/min Temp (24hrs), Avg:99.9 F (37.7 C), Min:98.1 F (36.7 C), Max:100.7 F (38.2 C)  Patient discussed in rounds.  He remains encephalopathic.  Patient becoming more hypernatremic since yesterday.   SLP had been following patient prior to intubation d/t dysphagia post stroke with recommendation for dysphagia 3, nectar thick liquids. Review of meal completions reflect (11/13- 30% lunch, 25% dinner). May likely need repeat MBS  post extubation per follow up today.   Loose stools r/t lactulose administration to aid in hyperammonemia.   Unable to obtain detailed nutrition related history. On admission, pt's nephew reports that pt lives with him. He is able to do all his ADL's at baseline. Pt uses a lot of advil  for arthritis and pain.   Admit weight: 67.4 kg Current weight: 56.6 kg  Drains/lines: Cortrak FMS  Medications: pepcid , lactulose 20g TID, thiamine  daily  Drips: IV abx LR @ 100ml/hr Levo @ 6 mcg/min  Labs:  Sodium 155 Potassium 3.3 BUN 50 Cr 2.38 Anion gap 17 AST 122 ALT 45 GFR 28 CBG's 66-128 x24 hours  NUTRITION - FOCUSED PHYSICAL EXAM: Flowsheet Row Most Recent Value  Orbital Region Severe depletion  Upper Arm Region Severe depletion  Thoracic and Lumbar Region Severe depletion  Buccal Region Unable to assess  Temple Region Severe depletion  Clavicle Bone Region Severe depletion  Clavicle and Acromion Bone Region Severe depletion  Scapular Bone Region Severe depletion  Dorsal Hand Severe depletion  Patellar Region Severe depletion  Anterior Thigh Region Severe depletion  Posterior Calf Region Severe depletion  Edema (RD Assessment) None  Hair Reviewed  Eyes Unable to assess  Mouth Unable to assess  Skin Reviewed  Nails Reviewed    Diet Order:   Diet Order             Diet NPO time specified  Diet effective now                   EDUCATION NEEDS:  No education needs have been identified at this time  Skin:  Skin Assessment: Reviewed RN Assessment  Last BM:  x12 hours via FMS  Height:  Ht Readings from Last 1 Encounters:  07/20/24 5' 10 (1.778 m)    Weight:  Wt Readings from Last 1 Encounters:  07/20/24 56.6 kg   BMI:  Body mass index is 17.9 kg/m.  Estimated Nutritional Needs:   Kcal:  1700-1900  Protein:  75-90g  Fluid:  >/=1.7L  Royce Maris, RDN, LDN Clinical Nutrition See AMiON for contact information.

## 2024-07-20 NOTE — Plan of Care (Signed)
  Problem: Education: Goal: Knowledge of disease or condition will improve Outcome: Not Progressing   Problem: Coping: Goal: Will verbalize positive feelings about self Outcome: Not Progressing   Problem: Health Behavior/Discharge Planning: Goal: Ability to manage health-related needs will improve Outcome: Not Progressing

## 2024-07-20 NOTE — Progress Notes (Signed)
 OT Cancellation Note  Patient Details Name: Lawrence Barnett MRN: [id number] DOB: [birth year]   Cancelled Treatment:    Reason Eval/Treat Not Completed: Medical issues which prohibited therapy (Patient is intubated, sedated and on pressors.  OT to attempt again tomorrow with possible re-eval.)  [name] 07/20/2024, 1:15 PM  Dick Laine, OTA Acute Rehabilitation Services  Office [phone]

## 2024-07-20 NOTE — Procedures (Signed)
 Cortrak  Tube Type:  Cortrak - 43 inches Tube Location:  Right nare Secured by: Bridle Initial Placement:  Gastric Technique Used to Measure Tube Placement:  Marking at nare/corner of mouth Cortrak Secured At:  70 cm   Cortrak Tube Team Note:  Consult received to place a Cortrak feeding tube.   No x-ray is required. RN may begin using tube.   If the tube becomes dislodged please keep the tube and contact the Cortrak team at [email or web address] for replacement.  If after hours and replacement cannot be delayed, place a NG tube and confirm placement with an abdominal x-ray.    Augustin Shams MS, RD, LDN If unable to be reached, please send secure chat to RD inpatient available from 8:00a-4:00p daily

## 2024-07-20 NOTE — Progress Notes (Signed)
 PT Cancellation Note  Patient Details Name: Lawrence Barnett MRN: [id number] DOB: [birth year]   Cancelled Treatment:    Reason Eval/Treat Not Completed: (P) Medical issues which prohibited therapy Pt is intubated, sedated and on pressors. PT will follow back for Re-Eval tomorrow.  [name] B. Fleeta Lapidus PT, DPT Acute Rehabilitation Services Please use secure chat or  Call Office [phone]    [name] Fleeta [facility] 07/20/2024, 10:42 AM

## 2024-07-20 NOTE — TOC Progression Note (Signed)
 Transition of Care [facility]) - Progression Note    Patient Details  Name: Lawrence Barnett MRN: [id number] Date of Birth: 02-Jan-1951  [facility]) CM/SW Contact  Bernardino Dean, [place] Phone Number: 07/20/2024, 9:20 AM  Clinical Narrative:    Patient intubated, on pressors. TOC following for disposition planning. Current plan is SNF pending progress.    Expected Discharge Plan: Skilled Nursing Facility Barriers to Discharge: Continued Medical Work up               Expected Discharge Plan and Services     Post Acute Care Choice: Skilled Nursing Facility Living arrangements for the past 2 months: Single Family Home                                       Social Drivers of Health (SDOH) Interventions SDOH Screenings   Tobacco Use: Medium Risk (07/17/2024)    Readmission Risk Interventions     No data to display

## 2024-07-20 NOTE — Progress Notes (Signed)
 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 Pt was progressing thru his hospitalization with a superimposed infection 2/2 suspected aspiration pneumonia being empirically treated.    Pt's mental status declined yesterday evening prompting overnight evaluation via RRT. Pt was reportedly found obtunded, tachycardic and tachypneic. VSS however. Abx were broadened, pt was given fluids, and stat cth revealing mildly increased ventricular size concerning for mildly progressive hydrocephalus. Neuro was reached out to who recommended repeat cth to eval progression further at 0800.    Pt's labs at 0200 this am revealed multiple areas for improvement that could contribute to pt's mental status as well. Thru the course of the day however it appears that pt's mental status did not improve, bicarb/renal function/ammonia/lactate/sodium have been worsening as well. Cth has yet to be complete.    Ccm was consulted 11/16 PM as pt remains obtunded and req ICU transfer.  Intubated, on peripheral pressors, multiple abnormal labs  Pertinent  Medical History  Htn  Significant Hospital Events: Including procedures, antibiotic start and stop dates in addition to other pertinent events   11/11 admitted to ICU  11/13 transferred to floor  11/16 CCM consulted and transferred back to ICU 11/16, intubated, on peripheral pressors  Antibiotics Cefepime  11/15>> Zyvox 11/16>> Vancomycin  11/15. Metronidazole 11/15-11/16. Ceftriaxone 11/15.  Interim History / Subjective:  Intubated Off sedation not responsive  Objective    Blood pressure 99/65, pulse (!) 119, temperature 99.1 F (37.3 C), temperature source Axillary, resp. rate  (!) 29, height 5' 10 (1.778 m), weight 56.6 kg, SpO2 100%.    Vent Mode: PRVC FiO2 (%):  [40 %-60 %] 40 % Set Rate:  [18 bmp] 18 bmp Vt Set:  [580 mL] 580 mL PEEP:  [5 cmH20] 5 cmH20 Plateau Pressure:  [13 cmH20-16 cmH20] 16 cmH20   Intake/Output Summary (Last 24 hours) at 07/20/2024 0754 Last data filed at 07/20/2024 0700 Gross per 24 hour  Intake 2322.18 ml  Output 1850 ml  Net 472.18 ml   Filed Weights   07/18/24 0500 07/19/24 0500 07/20/24 0500  Weight: 60.2 kg 59.4 kg 56.6 kg    Examination: General: Elderly, frail, chronically ill-appearing HENT: Dry oral mucosa, endotracheal tube in place Lungs: Clear breath sounds bilaterally Cardiovascular: S1-S2 appreciated  Abdomen: Soft, bowel sounds appreciated Extremities: No clubbing, no edema Neuro: Pupils reacting, positive deep suction, breathing over the vent setting GU: Fair output  Negative fluid balance of 2.2 L in total  Resolved problem list   Assessment and Plan   Intraparenchymal hemorrhage with IVH with hydrocephalus - Slight worsening of intraparenchymal hemorrhage with follow-up CT - Encephalopathic - Continue monitoring - Neurology continues to monitor  Hypernatremia - Start free water 300 cc every 4 Hypokalemia - Repleted Hyperammonemia - Lactulose ordered  Acute hypoxemic respiratory failure - Continue mechanical ventilation  -Target TVol 6-8cc/kgIBW -Target Plateau Pressure < 30cm H20 -Target driving pressure less than 15 cm of water -Target PaO2 55-65: titrate PEEP/FiO2 per protocol -Ventilator associated pneumonia prevention protocol  Acute kidney injury - Continue to monitor - Maintain renal perfusion - Maintain MAP greater than 65 -Continue fluid resuscitation -Renal ultrasound ordered  Concern for hyperthyroidism with TSH level 0.1 T4 of 2.2 -Thyroid ultrasound  when more stable - Consistent with hypothyroidism - Start methimazole 10 mg 3 times daily - May need  propranolol -Currently on Levophed  Nutrition - Tube feeds ordered  Possible pneumonia - Continue cefepime  - Discontinue Flagyl, discontinue linezolid  Labs ordered for this a.m.   Labs   CBC: Recent Labs  Lab 07/14/24 1904 07/14/24 1908 07/18/24 0450 07/18/24 2051 07/19/24 0255 07/19/24 2252 07/19/24 2336 07/20/24 0216 07/20/24 0445  WBC 6.2   < > 10.3 11.8* 16.2* 13.7*  --  14.6*  --   NEUTROABS 3.5  --   --   --   --   --   --   --   --   HGB 15.3   < > 17.7* 17.2* 17.0 15.3 14.3 17.1* 18.0*  HCT 46.7   < > 53.7* 54.1* 52.1* 47.4 42.0 54.0* 53.0*  MCV 81.6   < > 83.5 84.4 84.6 84.6  --  85.7  --   PLT 200   < > 202 238 220 203  --  232  --    < > = values in this interval not displayed.    Basic Metabolic Panel: Recent Labs  Lab 07/18/24 0450 07/18/24 2051 07/19/24 0255 07/19/24 1458 07/19/24 2321 07/19/24 2336 07/20/24 0216 07/20/24 0445  NA 148* 152* 153*  --  155* 158* 154* 157*  K 4.1 4.0 3.6  --  3.6 2.5* 3.2* 3.2*  CL 115* 119* 119*  --  119*  --  114*  --   CO2 17* 18* 14*  --  22  --  24  --   GLUCOSE 92 99 100*  --  110*  --  93  --   BUN 24* 36* 35*  --  40*  --  41*  --   CREATININE 0.99 1.35* 1.37*  --  1.75*  --  1.67*  --   CALCIUM 9.8 9.8 9.5  --  9.2  --  9.4  --   MG  --   --   --  2.1  --   --  2.2  --   PHOS  --   --   --  2.7  --   --  3.6  --    GFR: Estimated Creatinine Clearance: 30.6 mL/min (A) (by C-G formula based on SCr of 1.67 mg/dL (H)). Recent Labs  Lab 07/18/24 2051 07/19/24 0255 07/19/24 2222 07/19/24 2252 07/20/24 0216  PROCALCITON 3.34  --   --   --   --   WBC 11.8* 16.2*  --  13.7* 14.6*  LATICACIDVEN 3.9* 4.6* 6.6*  --   --     Liver Function Tests: Recent Labs  Lab 07/14/24 1904 07/18/24 2051 07/19/24 2321 07/20/24 0216  AST 29 87* 107* 122*  ALT 18 39 41 45*  ALKPHOS 140* 95 75 78  BILITOT 0.8 3.3* 2.7* 2.7*  PROT 8.2* 8.5* 6.8 7.8  ALBUMIN 3.6 3.0* 2.1* 2.5*   No results for input(s):  LIPASE, AMYLASE in the last 168 hours. Recent Labs  Lab 07/19/24 0255  AMMONIA 59*    ABG    Component Value Date/Time   PHART 7.595 (H) 07/20/2024 0445   PCO2ART 28.4 (L) 07/20/2024 0445   PO2ART 184 (H) 07/20/2024 0445   HCO3 27.6 07/20/2024 0445   TCO2 28 07/20/2024 0445   ACIDBASEDEF 0.8 07/18/2024 2250   O2SAT 100 07/20/2024 0445     Coagulation Profile: Recent Labs  Lab 07/14/24 1904  INR 1.0  Cardiac Enzymes: No results for input(s): CKTOTAL, CKMB, CKMBINDEX, TROPONINI in the last 168 hours.  HbA1C: Hgb A1c MFr Bld  Date/Time Value Ref Range Status  07/16/2024 06:01 AM 5.6 4.8 - 5.6 % Final    Comment:    (NOTE) Diagnosis of Diabetes The following HbA1c ranges recommended by the American Diabetes Association (ADA) may be used as an aid in the diagnosis of diabetes mellitus.  Hemoglobin             Suggested A1C NGSP%              Diagnosis  <5.7                   Non Diabetic  5.7-6.4                Pre-Diabetic  >6.4                   Diabetic  <7.0                   Glycemic control for                       adults with diabetes.      CBG: Recent Labs  Lab 07/19/24 2221 07/19/24 2239 07/19/24 2328 07/20/24 0330 07/20/24 0749  GLUCAP 66* 84 82 114* 128*    Review of Systems:   Unable to provide any history  Past Medical History:  He,  has a past medical history of Hypertension.   Surgical History:  No past surgical history on file.   Social History:   reports that he has quit smoking. He has never used smokeless tobacco. He reports current alcohol use of about 1.0 standard drink of alcohol per week. He reports that he does not use drugs.   Family History:  His family history is not on file.   Allergies Allergies  Allergen Reactions   Clonidine  Other (See Comments)    Weakness, lower extremity paralysis feeling   Penicillins Shortness Of Breath    Patient reports reaction occurred as a teenager and he  experienced shortness of breath not requiring hospitalization.    Amlodipine Swelling   Lisinopril Swelling   Metoprolol Swelling   Tylenol  [Acetaminophen ]      Home Medications  Prior to Admission medications   Medication Sig Start Date End Date Taking? Authorizing Provider  FAMOTIDINE  PO Take 1 tablet by mouth daily.   Yes [provider]  naproxen sodium (ALEVE) 220 MG tablet Take 440 mg by mouth.   Yes [provider]    The patient is critically ill with multiple organ systems failure and requires high complexity decision making for assessment and support, frequent evaluation and titration of therapies, application of advanced monitoring technologies and extensive interpretation of multiple databases. Critical Care Time devoted to patient care services described in this note independent of APP/resident time (if applicable)  is 33 minutes.   Jennet Epley MD [facility] Personal pager: See Amion If unanswered, please page CCM On-call: #[phone]

## 2024-07-20 NOTE — Progress Notes (Signed)
 STROKE TEAM PROGRESS NOTE    SIGNIFICANT HOSPITAL EVENTS 11/11: Patient admitted with right basal ganglia ICH with IVH 11/13: Patient transferred out of the ICU  INTERIM HISTORY/SUBJECTIVE Patient remains critically ill.  He is on multiple pressors.  Remains intubated on ventilatory support.  Remains on multiple antibiotics  o vancomycin , cefepime  and Flagyl.  Mental status slightly improved.  White count is still elevated.  Still febrile requiring Tylenol  around-the-clock.   .  Remains tachycardic and blood pressure is low requiring pressors.  OBJECTIVE  CBC    Component Value Date/Time   WBC 16.8 (H) 07/20/2024 0859   RBC 6.33 (H) 07/20/2024 0859   HGB 17.2 (H) 07/20/2024 0859   HGB 16.2 10/07/2012 1618   HCT 52.9 (H) 07/20/2024 0859   HCT 47.8 10/07/2012 1618   PLT 272 07/20/2024 0859   PLT 306 10/07/2012 1618   MCV 83.6 07/20/2024 0859   MCV 92 10/07/2012 1618   MCH 27.2 07/20/2024 0859   MCHC 32.5 07/20/2024 0859   RDW 15.9 (H) 07/20/2024 0859   RDW 13.9 10/07/2012 1618   LYMPHSABS 2.1 07/14/2024 1904   MONOABS 0.4 07/14/2024 1904   EOSABS 0.1 07/14/2024 1904   BASOSABS 0.0 07/14/2024 1904    BMET    Component Value Date/Time   NA 155 (H) 07/20/2024 0859   NA 139 10/08/2012 0431   K 3.3 (L) 07/20/2024 0859   K 4.0 10/08/2012 0431   CL 114 (H) 07/20/2024 0859   CL 107 10/08/2012 0431   CO2 24 07/20/2024 0859   CO2 25 10/08/2012 0431   GLUCOSE 140 (H) 07/20/2024 0859   GLUCOSE 133 (H) 10/08/2012 0431   BUN 50 (H) 07/20/2024 0859   BUN 10 10/08/2012 0431   CREATININE 2.38 (H) 07/20/2024 0859   CREATININE 0.83 10/08/2012 0431   CALCIUM 9.2 07/20/2024 0859   CALCIUM 9.4 10/08/2012 0431   GFRNONAA 28 (L) 07/20/2024 0859   GFRNONAA >60 10/08/2012 0431    IMAGING past 24 hours US  RENAL Result Date: 07/20/2024 EXAM: US  Retroperitoneum Complete, Renal. CLINICAL HISTORY: 409830 AKI (acute kidney injury) 409830 AKI (acute kidney injury) TECHNIQUE: Real-time  ultrasound of the retroperitoneum (complete) with image documentation. COMPARISON: Comparison is made to 07/17/2019. FINDINGS: RIGHT KIDNEY: Multiple simple cortical cysts are seen within the right kidney measuring up to 2.3 cm for which no follow up imaging is recommended. Renal cortical thickness and echogenicity is within normal limits. No hydronephrosis. No intrarenal mass or calcification. LEFT KIDNEY: Two continuous cortical cysts are identified within the medial interpolar region of the left kidney and are partially visualized due to shadowing by overlying bowel gas, but appear grossly stable since prior examination. No follow up imaging is recommended for these lesions. Renal cortical thickness and echogenicity is within normal limits. No hydronephrosis. No intrarenal mass or calcification. BLADDER: The bladder is distended, but is otherwise unremarkable without intraluminal debris or mural mass. IMPRESSION: 1. No hydronephrosis 2. bladder distention, possibly related to bladder outlet obstruction or voluntary retention. 3. Multiple simple renal cortical cysts, no follow-up recommended Electronically signed by: Dorethia Molt MD 07/20/2024 03:03 AM EST RP Workstation: [id number]   Portable Chest x-ray Result Date: 07/19/2024 EXAM: 1 VIEW XRAY OF THE CHEST 07/19/2024 10:48:00 PM COMPARISON: Portable chest yesterday at 8:54 pm. CLINICAL HISTORY: [id number] Endotracheal tube present [id number] Endotracheal tube present. FINDINGS: LINES, TUBES AND DEVICES: Tip of endotracheal tube (ETT) 4.3 cm from the carina. Nasogastric tube (NGT) enters the stomach with the full intragastric course out of  view. There are multiple overlying telemetry leads. There are overlying electrical pads inferiorly. LUNGS AND PLEURA: No focal pulmonary opacity. The lungs are clear of infiltrate. No pleural effusion. No pneumothorax. HEART AND MEDIASTINUM: The mediastinum is stable with mild aortic tortuosity and atherosclerosis. The cardiac  size is normal. BONES AND SOFT TISSUES: Thoracic spondylosis. No acute osseous abnormality. IMPRESSION: 1. Appropriate positioning of endotracheal tube with tip 4.3 cm above the carina, and nasogastric tube coursing into the stomach with intragastric portion out of view. 2. No acute cardiopulmonary process identified. Electronically signed by: Francis Quam MD 07/19/2024 11:04 PM EST RP Workstation: [id number]   CT HEAD WO CONTRAST ( ) Result Date: 07/19/2024 EXAM: CT HEAD WITHOUT CONTRAST 07/19/2024 09:41:57 PM TECHNIQUE: CT of the head was performed without the administration of intravenous contrast. Automated exposure control, iterative reconstruction, and/or weight based adjustment of the mA/kV was utilized to reduce the radiation dose to as low as reasonably achievable. COMPARISON: Ct Head Jul 18, 2024 CLINICAL HISTORY: Neuro deficit, acute, stroke suspected; follow-up CT recommended by neurology FINDINGS: BRAIN AND VENTRICLES: No significant change in size of an intraparenchymal hemorrhage in the right thalamus. Slight interval increase in size of intraventricular hemorrhage. New trace subarachnoid hemorrhage layering in the right sylvian fissure. No progressive mass effect. Stable mild hydrocephalus. Stable mild hydrocephalus. Similar patchy white matter hypodensities, compatible with chronig microvascular ischemic change. ORBITS: No acute abnormality. SINUSES: Opacfied left maxillary sinus and right maxillary sinus mucosal thickening. SOFT TISSUES AND SKULL: No acute soft tissue abnormality. No skull fracture. IMPRESSION: 1. No significant change in size of an intraparenchymal hemorrhage in the right thalamus. 2.  Slight interval increase in size of intraventricular hemorrhage. 3.  New trace subarachnoid hemorrhage layering in the right sylvian fissure. 4. Stable mild hydrocephalus. Electronically signed by: Gilmore Molt MD 07/19/2024 10:12 PM EST RP Workstation: [id number]     Vitals:    07/20/24 1330 07/20/24 1345 07/20/24 1400 07/20/24 1413  BP: 109/81 105/69  98/75  Pulse: (!) 124 (!) 123 (!) 124 (!) 127  Resp: (!) 32 (!) 32 (!) 34 (!) 32  Temp:      TempSrc:      SpO2: 100% 96% 100% 91%  Weight:      Height:         PHYSICAL EXAM General:   , well-nourished, well-developed elderly patient in no acute distress.  Intubated Psych:  Mood and affect appropriate for situation CV: Regular rate and rhythm on monitor Respiratory:  Regular, unlabored respirations on room air GI: Abdomen soft and nontender   NEURO:  Mental Status: Patient is intubated.  He is drowsy but can be barely aroused.  Not following commands.  Speech/Language: speech cannot be ascertained.  Cranial Nerves:  II: PERRL. Visual fields full.  III, IV, VI: Right gaze preference, able to cross midline V: Sensation is intact to light touch and symmetrical to face.  VII: Left facial droop VIII: hearing intact to voice. IX, X: Voice is dysarthric XII: tongue is midline without fasciculations. Motor: Able to move right upper and lower extremities with antigravity strength, no movement of left upper extremity but is able to flicker left lower extremity to noxious Tone: is normal and bulk is normal Sensation-sensation diminished on the left Coordination: Unable to test gait- deferred      ASSESSMENT/PLAN  Lawrence Barnett is a 73 y.o. male with history of hypertension admitted for left-sided hemiplegia.  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  He did not pass his swallow study and will have cortrak inserted NIH on Admission 17  ICH:  right ganglia/thalamus ICH with IVH, etiology: Likely hypertensive Code Stroke CT head 7 mm acute IPH centered at right thalamus with IVH, no hydrocephalus CTA head & neck no LVO or hemodynamically significant stenosis, no vascular abnormality underlying right thalamic hemorrhage  Follow-up head CT slight interval increase in size of  right thalamic hemorrhage, slightly increased IVH MRI acute right thalamic IPH with IVH, stable, no evidence of underlying mass 2D Echo EF 55-60% LDL 73 HgbA1c 5.6 UDS neg VTE prophylaxis - heparin subq No antithrombotic prior to admission, now on No antithrombotic secondary to IPH Therapy recommendations:  CIR Disposition: Pending  Hypertension Home meds: None BP stable Now stable on the high end On losartan 100, hydralazine  50mg  TID BP goal < 160 Long-term BP goal normotensive  Lipid management Home meds: None LDL 73, goal < 70 Add statin at discharge  Fever with tachycardia ?  Aspiration Tmax 102.6 with lethargy Concern for aspiration CXR negative Keep NPO and close monitoring On   antibiotic with vancomycin , cefepime  and flagyl   AKI Hypernatremia Na 145--148--152 Cre 0.84-0.99-1.35 On IVF  Given bolus\ Close monitoring  Dysphagia Patient has post-stroke dysphagia, SLP consulted  Was on soft diet with nectar thick liquids, now n.p.o. due to concern of aspiration Continue IVF   Other Stroke Risk Factors Advanced age   Hospital day # 6     Pt is critically ill.  Respiratory failure, encephalopathic and dense left hemiplegia and.  Still has spiking fever and tachycardia, developed AKI and hypernatremia.   Tachycardia likely due to fever, now on empiric abx. Management per primary team. Will follow.  Prognosis is very poor.  Consider palliative care approach.  Discussed with Dr. Neda critical care medicine   I personally spent a total of 50 minutes in the care of the patient today including getting/reviewing separately obtained history, performing a medically appropriate exam/evaluation, counseling and educating, placing orders, referring and communicating with other health care professionals, documenting clinical information in the EHR, independently interpreting results, and coordinating care.   Stroke team will sign off.  Kindly call for questions      Lawrence Popp, MD Stroke Neurology 07/20/2024 3:15 PM         To contact Stroke Continuity provider, please refer to [email or web address]. After hours, contact General Neurology

## 2024-07-20 NOTE — Progress Notes (Signed)
 SLP Cancellation Note  Patient Details Name: Lawrence Barnett MRN: [id number] DOB: Aug 04, 1951   Cancelled treatment:        Pt with medical decline and intubated 11/16. Will follow for potential for extubation in next few days however if intubation is prolonged will likely discharge and request new orders. Pt will likely need a repeat MBS prior to initiating po's.    Dustin Olam Bull 07/20/2024, 10:43 AM

## 2024-07-20 NOTE — Progress Notes (Signed)
 Concern for thyroid storm  Hydrocortisone Iodide Propranolol

## 2024-07-21 DIAGNOSIS — I610 Nontraumatic intracerebral hemorrhage in hemisphere, subcortical: Secondary | ICD-10-CM | POA: Diagnosis not present

## 2024-07-21 DIAGNOSIS — E870 Hyperosmolality and hypernatremia: Secondary | ICD-10-CM | POA: Diagnosis not present

## 2024-07-21 DIAGNOSIS — E876 Hypokalemia: Secondary | ICD-10-CM | POA: Diagnosis not present

## 2024-07-21 DIAGNOSIS — G919 Hydrocephalus, unspecified: Secondary | ICD-10-CM | POA: Diagnosis not present

## 2024-07-21 DIAGNOSIS — E039 Hypothyroidism, unspecified: Secondary | ICD-10-CM

## 2024-07-21 LAB — POCT I-STAT 7, (LYTES, BLD GAS, ICA,H+H)
Acid-Base Excess: 3 mmol/L — ABNORMAL HIGH (ref 0.0–2.0)
Bicarbonate: 26.2 mmol/L (ref 20.0–28.0)
Calcium, Ion: 1.22 mmol/L (ref 1.15–1.40)
HCT: 43 % (ref 39.0–52.0)
Hemoglobin: 14.6 g/dL (ref 13.0–17.0)
O2 Saturation: 94 %
Patient temperature: 37
Potassium: 3.9 mmol/L (ref 3.5–5.1)
Sodium: 150 mmol/L — ABNORMAL HIGH (ref 135–145)
TCO2: 27 mmol/L (ref 22–32)
pCO2 arterial: 34.3 mmHg (ref 32–48)
pH, Arterial: 7.491 — ABNORMAL HIGH (ref 7.35–7.45)
pO2, Arterial: 63 mmHg — ABNORMAL LOW (ref 83–108)

## 2024-07-21 LAB — BASIC METABOLIC PANEL WITH GFR
Anion gap: 15 (ref 5–15)
BUN: 59 mg/dL — ABNORMAL HIGH (ref 8–23)
CO2: 25 mmol/L (ref 22–32)
Calcium: 8.9 mg/dL (ref 8.9–10.3)
Chloride: 111 mmol/L (ref 98–111)
Creatinine, Ser: 1.89 mg/dL — ABNORMAL HIGH (ref 0.61–1.24)
GFR, Estimated: 37 mL/min — ABNORMAL LOW (ref >60)
Glucose, Bld: 176 mg/dL — ABNORMAL HIGH (ref 70–99)
Potassium: 3.5 mmol/L (ref 3.5–5.1)
Sodium: 151 mmol/L — ABNORMAL HIGH (ref 135–145)

## 2024-07-21 LAB — GLUCOSE, CAPILLARY
Glucose-Capillary: 158 mg/dL — ABNORMAL HIGH (ref 70–99)
Glucose-Capillary: 181 mg/dL — ABNORMAL HIGH (ref 70–99)
Glucose-Capillary: 193 mg/dL — ABNORMAL HIGH (ref 70–99)
Glucose-Capillary: 194 mg/dL — ABNORMAL HIGH (ref 70–99)
Glucose-Capillary: 197 mg/dL — ABNORMAL HIGH (ref 70–99)
Glucose-Capillary: 203 mg/dL — ABNORMAL HIGH (ref 70–99)

## 2024-07-21 LAB — CBC
HCT: 49.8 % (ref 39.0–52.0)
Hemoglobin: 16.2 g/dL (ref 13.0–17.0)
MCH: 27.6 pg (ref 26.0–34.0)
MCHC: 32.5 g/dL (ref 30.0–36.0)
MCV: 85 fL (ref 80.0–100.0)
Platelets: 205 K/uL (ref 150–400)
RBC: 5.86 MIL/uL — ABNORMAL HIGH (ref 4.22–5.81)
RDW: 15.3 % (ref 11.5–15.5)
WBC: 15.6 K/uL — ABNORMAL HIGH (ref 4.0–10.5)
nRBC: 0.1 % (ref 0.0–0.2)

## 2024-07-21 LAB — T3, FREE: T3, Free: 3.2 pg/mL (ref 2.0–4.4)

## 2024-07-21 MED ORDER — LACTATED RINGERS IV SOLN: INTRAVENOUS | Status: DC

## 2024-07-21 MED ORDER — POTASSIUM CHLORIDE 20 MEQ PO PACK
20.0000 meq | PACK | Freq: Once | ORAL | Status: AC
  Administered 2024-07-21: 20 meq
  Filled 2024-07-21: qty 1

## 2024-07-21 MED ORDER — LACTULOSE 10 GM/15ML PO SOLN
10.0000 g | Freq: Two times a day (BID) | ORAL | Status: DC
  Administered 2024-07-21 (×2): 10 g
  Filled 2024-07-21 (×2): qty 15

## 2024-07-21 MED ORDER — PROPRANOLOL HCL 20 MG PO TABS
20.0000 mg | ORAL_TABLET | Freq: Three times a day (TID) | ORAL | Status: DC
  Administered 2024-07-21 (×3): 20 mg
  Filled 2024-07-21 (×4): qty 1

## 2024-07-21 NOTE — Progress Notes (Signed)
 OT Cancellation Note  Patient Details Name: Lawrence Barnett MRN: [id number] DOB: May 25, 1951   Cancelled Treatment:    Reason Eval/Treat Not Completed: Medical issues which prohibited therapy. Transfer to ICU yesterday. Per RN, unresponsive, on multiple pressors, intubated, RASS -5. Will check back tomorrow for wakefulness/appropriateness of assessment.   [name] [name], OTR/L [facility] Acute Rehabilitation Office: [phone]   [name] 07/21/2024, 1:19 PM

## 2024-07-21 NOTE — Progress Notes (Signed)
 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 Pt was progressing thru his hospitalization with a superimposed infection 2/2 suspected aspiration pneumonia being empirically treated.    Pt's mental status declined yesterday evening prompting overnight evaluation via RRT. Pt was reportedly found obtunded, tachycardic and tachypneic. VSS however. Abx were broadened, pt was given fluids, and stat cth revealing mildly increased ventricular size concerning for mildly progressive hydrocephalus. Neuro was reached out to who recommended repeat cth to eval progression further at 0800.    Pt's labs at 0200 this am revealed multiple areas for improvement that could contribute to pt's mental status as well. Thru the course of the day however it appears that pt's mental status did not improve, bicarb/renal function/ammonia/lactate/sodium have been worsening as well. Cth has yet to be complete.    Ccm was consulted 11/16 PM as pt remains obtunded and req ICU transfer.  Intubated, on peripheral pressors, multiple abnormal labs  Pertinent  Medical History  Htn  Significant Hospital Events: Including procedures, antibiotic start and stop dates in addition to other pertinent events   11/11 admitted to ICU  11/13 transferred to floor  11/16 CCM consulted and transferred back to ICU 11/16, intubated, on peripheral pressors 11/17-did tolerate, mental status data remain poor  Antibiotics Cefepime  11/15>> Zyvox 11/16>> Vancomycin  11/15. Metronidazole 11/15-11/16. Ceftriaxone 11/15.  Interim History / Subjective:  Off sedation Not responsive Breathing over the vent  Objective    Blood pressure (!) 120/90, pulse (!) 101,  temperature 98.6 F (37 C), resp. rate (!) 25, height 5' 10 (1.778 m), weight 57.4 kg, SpO2 100%.    Vent Mode: PRVC FiO2 (%):  [40 %] 40 % Set Rate:  [14 bmp] 14 bmp Vt Set:  [440 mL] 440 mL PEEP:  [5 cmH20] 5 cmH20 Plateau Pressure:  [7 cmH20-17 cmH20] 10 cmH20   Intake/Output Summary (Last 24 hours) at 07/21/2024 0750 Last data filed at 07/21/2024 0700 Gross per 24 hour  Intake 5345.48 ml  Output 2889 ml  Net 2456.48 ml   Filed Weights   07/19/24 0500 07/20/24 0500 07/21/24 0500  Weight: 59.4 kg 56.6 kg 57.4 kg    Examination: General: Elderly frail, chronically ill-appearing  HENT: Dry oral mucosa, endotracheal tube in place Lungs: Clear breath sounds bilaterally Cardiovascular: S1-S2 appreciated Abdomen: Soft, bowel sounds appreciated Extremities: No clubbing, no edema Neuro: Pupils reacting, positive deep suction GU: Fair output  Fluid balance approaching even  I reviewed last 24 h vitals and pain scores, last 48 h intake and output, last 24 h labs and trends, and last 24 h imaging results. Sodium improved to 151, BUN 59, creatinine 1.89 WBC 15.6  Resolved problem list   Assessment and Plan   Intraparenchymal hemorrhage with IVH with hydrocephalus -Slight worsening of intraparenchymal hemorrhage with follow-up CT - Remains encephalopathic - Neuroprotective measures- normothermia, euglycemia, HOB greater than 30, head in neutral alignment, normocapnia, normoxia  Hyponatremia - On free water Sodium improving -Continue 300 cc every 4  Hypokalemia repleted  Hyperammonemia - On lactulose  Acute hypoxemic respiratory failure -Continue mechanical ventilation per ARDS protocol -Target TVol 6-8cc/kgIBW -Target Plateau Pressure < 30cm H20 -Target driving pressure less than 15 cm of water -Target  PaO2 55-65: titrate PEEP/FiO2 per protocol -Ventilator associated pneumonia prevention protocol  Acute kidney injury - Continue to monitor - Maintain renal  perfusion Maintain MAP greater than 65 Will continue fluid resuscitation Renal ultrasound did not reveal any hydronephrosis Bladder was distended  Hypothyroidism, 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9* 49.8  MCV 81.6   < > 84.6 84.6  --  85.7  --  83.6 85.0  PLT 200   < > 220 203  --  232  --  272 205   < > = values in this interval not displayed.    Basic Metabolic Panel: Recent Labs  Lab 07/19/24 0255 07/19/24 1458 07/19/24 2321 07/19/24 2336 07/20/24 0216 07/20/24 0445 07/20/24 0859 07/21/24 0510  NA 153*  --  155* 158* 154* 157* 155* 151*  K 3.6  --  3.6 2.5* 3.2* 3.2* 3.3* 3.5  CL 119*  --  119*  --  114*  --  114* 111  CO2 14*  --  22  --  24  --  24 25  GLUCOSE 100*  --  110*  --  93  --  140* 176*  BUN 35*  --  40*  --  41*  --  50* 59*  CREATININE 1.37*  --  1.75*  --  1.67*  --  2.38* 1.89*  CALCIUM 9.5  --  9.2  --  9.4  --  9.2 8.9  MG  --  2.1  --   --  2.2  --   --   --   PHOS  --  2.7  --   --  3.6  --   --   --    GFR: Estimated Creatinine Clearance: 27.4 mL/min (A) (by C-G formula based on SCr of 1.89 mg/dL (H)). Recent Labs  Lab 07/18/24 2051 07/19/24 0255 07/19/24 2222 07/19/24 2252 07/20/24 0216 07/20/24 0859 07/20/24 1050 07/20/24 1834 07/21/24 0510  PROCALCITON 3.34  --   --   --   --   --   --   --   --   WBC 11.8*   < >   --  13.7* 14.6* 16.8*  --   --  15.6*  LATICACIDVEN 3.9*   < > 6.6*  --   --  3.1* 2.4* 1.5  --    < > = values in this interval not displayed.    Liver Function Tests: Recent Labs  Lab 07/14/24 1904 07/18/24 2051 07/19/24 2321 07/20/24 0216  AST 29 87* 107* 122*  ALT 18 39 41 45*  ALKPHOS 140* 95 75 78  BILITOT 0.8 3.3* 2.7* 2.7*  PROT 8.2* 8.5* 6.8 7.8  ALBUMIN  3.6 3.0* 2.1* 2.5*   No results for input(s): LIPASE, AMYLASE in the last 168 hours. Recent Labs  Lab 07/19/24 0255  AMMONIA 59*    ABG    Component Value Date/Time   PHART 7.595 (H) 07/20/2024 0445   PCO2ART 28.4 (L) 07/20/2024 0445   PO2ART 184 (H) 07/20/2024 0445   HCO3 29.8 (H) 07/20/2024 0859   TCO2 28 07/20/2024 0445   ACIDBASEDEF 0.8 07/18/2024 2250   O2SAT 84.9 07/20/2024 0859     Coagulation Profile: Recent Labs  Lab 07/14/24 1904  INR 1.0    Cardiac Enzymes: No results for input(s): CKTOTAL, CKMB, CKMBINDEX, TROPONINI in the last 168 hours.  HbA1C: Hgb A1c MFr Bld  Date/Time Value Ref Range Status  07/16/2024 06:01 AM 5.6 4.8 - 5.6 % Final    Comment:    (NOTE) Diagnosis of Diabetes The following HbA1c ranges recommended by the American Diabetes Association (ADA) may be used as an aid in the diagnosis of diabetes mellitus.  Hemoglobin             Suggested A1C NGSP%              Diagnosis  <5.7                   Non Diabetic  5.7-6.4                Pre-Diabetic  >6.4                   Diabetic  <7.0                   Glycemic control for                       adults with diabetes.      CBG: Recent Labs  Lab 07/20/24 1213 07/20/24 1624 07/20/24 1913 07/20/24 2315 07/21/24 0400  GLUCAP 75 128* 149* 119* 158*    Review of Systems:   Unable to provide any history  Past Medical History:  He,  has a past medical history of Hypertension.   Surgical History:  No past surgical history on file.   Social History:   reports that he has quit smoking. He  has never used smokeless tobacco. He reports current alcohol use of about 1.0 standard drink of alcohol per week. He reports that he does not use drugs.   Family History:  His family history is not on file.   Allergies Allergies  Allergen Reactions   Clonidine  Other (See Comments)    Weakness, lower extremity paralysis feeling   Penicillins Shortness Of Breath    Patient reports reaction occurred as a teenager and he experienced shortness of breath not requiring hospitalization.    Amlodipine Swelling   Lisinopril Swelling   Metoprolol Swelling   Tylenol  [Acetaminophen ]      Home Medications  Prior to Admission medications   Medication Sig Start Date End Date Taking? Authorizing Provider  FAMOTIDINE  PO Take 1 tablet by mouth daily.   Yes [provider]  naproxen sodium (ALEVE) 220 MG tablet Take 440 mg by mouth.   Yes [provider]    The patient is critically ill with multiple organ systems failure and requires high complexity decision making for assessment and support, frequent evaluation and titration of therapies, application of advanced monitoring technologies and extensive interpretation of multiple databases. Critical Care Time devoted to patient care services described in this note  independent of APP/resident time (if applicable)  is 33 minutes.   Jennet Epley MD [facility] Personal pager: See Amion If unanswered, please page CCM On-call: #[phone]

## 2024-07-21 NOTE — Progress Notes (Signed)
 PT Cancellation Note  Patient Details Name: Lawrence Barnett MRN: [id number] DOB: 07/30/1951   Cancelled Treatment:    Reason Eval/Treat Not Completed: Medical issues which prohibited therapy (Noted transfer to ICU yesterday. Pt unresponsive per nurse. Will check back and see if pt with improved wakefulness tomorrow.)   Stephane JULIANNA Bevel 07/21/2024, 12:32 PM [name] M,PT Acute Rehab Services [phone]

## 2024-07-21 NOTE — TOC Progression Note (Addendum)
 Transition of Care [facility]) - Progression Note    Patient Details  Name: Lawrence Barnett MRN: [id number] Date of Birth: 04-Dec-1950  [facility]) CM/SW Contact  Lendia Dais, [place] Phone Number: 07/21/2024, 2:41 PM  Clinical Narrative:  Pt is not yet medically stable for SNF/ PT. Pt is currently intubated and unresponsive.  CSW will continue to monitor.    Expected Discharge Plan: Skilled Nursing Facility Barriers to Discharge: Continued Medical Work up               Expected Discharge Plan and Services     Post Acute Care Choice: Skilled Nursing Facility Living arrangements for the past 2 months: Single Family Home                                       Social Drivers of Health (SDOH) Interventions SDOH Screenings   Tobacco Use: Medium Risk (07/17/2024)    Readmission Risk Interventions     No data to display

## 2024-07-21 NOTE — TOC CAGE-AID Note (Signed)
 Transition of Care [facility]) - CAGE-AID Screening   Patient Details  Name: Lawrence Barnett MRN: [id number] Date of Birth: 1951-06-12  Transition of Care [facility]) CM/SW Contact:    Herlinda Heady E Sena Hoopingarner, LCSW Phone Number: 07/21/2024, 1:58 PM   Clinical Narrative: Currently intubated.   CAGE-AID Screening: Substance Abuse Screening unable to be completed due to: : Patient unable to participate

## 2024-07-22 ENCOUNTER — Inpatient Hospital Stay (HOSPITAL_COMMUNITY)

## 2024-07-22 DIAGNOSIS — I610 Nontraumatic intracerebral hemorrhage in hemisphere, subcortical: Secondary | ICD-10-CM | POA: Diagnosis not present

## 2024-07-22 DIAGNOSIS — G919 Hydrocephalus, unspecified: Secondary | ICD-10-CM | POA: Diagnosis not present

## 2024-07-22 DIAGNOSIS — E870 Hyperosmolality and hypernatremia: Secondary | ICD-10-CM | POA: Diagnosis not present

## 2024-07-22 DIAGNOSIS — E876 Hypokalemia: Secondary | ICD-10-CM | POA: Diagnosis not present

## 2024-07-22 LAB — BASIC METABOLIC PANEL WITH GFR
Anion gap: 14 (ref 5–15)
BUN: 56 mg/dL — ABNORMAL HIGH (ref 8–23)
CO2: 24 mmol/L (ref 22–32)
Calcium: 9 mg/dL (ref 8.9–10.3)
Chloride: 113 mmol/L — ABNORMAL HIGH (ref 98–111)
Creatinine, Ser: 1.42 mg/dL — ABNORMAL HIGH (ref 0.61–1.24)
GFR, Estimated: 52 mL/min — ABNORMAL LOW (ref >60)
Glucose, Bld: 202 mg/dL — ABNORMAL HIGH (ref 70–99)
Potassium: 4 mmol/L (ref 3.5–5.1)
Sodium: 151 mmol/L — ABNORMAL HIGH (ref 135–145)

## 2024-07-22 LAB — CBC
HCT: 47.3 % (ref 39.0–52.0)
Hemoglobin: 15.2 g/dL (ref 13.0–17.0)
MCH: 27.5 pg (ref 26.0–34.0)
MCHC: 32.1 g/dL (ref 30.0–36.0)
MCV: 85.5 fL (ref 80.0–100.0)
Platelets: 218 K/uL (ref 150–400)
RBC: 5.53 MIL/uL (ref 4.22–5.81)
RDW: 15.3 % (ref 11.5–15.5)
WBC: 15.3 K/uL — ABNORMAL HIGH (ref 4.0–10.5)
nRBC: 0 % (ref 0.0–0.2)

## 2024-07-22 LAB — GLUCOSE, CAPILLARY
Glucose-Capillary: 114 mg/dL — ABNORMAL HIGH (ref 70–99)
Glucose-Capillary: 144 mg/dL — ABNORMAL HIGH (ref 70–99)
Glucose-Capillary: 149 mg/dL — ABNORMAL HIGH (ref 70–99)
Glucose-Capillary: 156 mg/dL — ABNORMAL HIGH (ref 70–99)
Glucose-Capillary: 167 mg/dL — ABNORMAL HIGH (ref 70–99)
Glucose-Capillary: 171 mg/dL — ABNORMAL HIGH (ref 70–99)
Glucose-Capillary: 190 mg/dL — ABNORMAL HIGH (ref 70–99)

## 2024-07-22 LAB — HEPATIC FUNCTION PANEL
ALT: 34 U/L (ref 0–44)
AST: 59 U/L — ABNORMAL HIGH (ref 15–41)
Albumin: 2 g/dL — ABNORMAL LOW (ref 3.5–5.0)
Alkaline Phosphatase: 85 U/L (ref 38–126)
Bilirubin, Direct: 0.2 mg/dL (ref 0.0–0.2)
Indirect Bilirubin: 0.6 mg/dL (ref 0.3–0.9)
Total Bilirubin: 0.8 mg/dL (ref 0.0–1.2)
Total Protein: 7.2 g/dL (ref 6.5–8.1)

## 2024-07-22 LAB — TSH: TSH: <0.1 u[IU]/mL — ABNORMAL LOW (ref 0.350–4.500)

## 2024-07-22 LAB — T4, FREE: Free T4: 1.37 ng/dL — ABNORMAL HIGH (ref 0.61–1.12)

## 2024-07-22 LAB — AMMONIA: Ammonia: 23 umol/L (ref 9–35)

## 2024-07-22 MED ORDER — NITROGLYCERIN 2 % TD OINT
1.0000 [in_us] | TOPICAL_OINTMENT | Freq: Three times a day (TID) | TRANSDERMAL | Status: AC
  Administered 2024-07-22 – 2024-07-23 (×3): 1 [in_us] via TOPICAL
  Filled 2024-07-22: qty 30

## 2024-07-22 MED ORDER — AMLODIPINE BESYLATE 10 MG PO TABS
10.0000 mg | ORAL_TABLET | Freq: Every day | ORAL | Status: DC
  Administered 2024-07-22 – 2024-07-29 (×8): 10 mg
  Filled 2024-07-22 (×8): qty 1

## 2024-07-22 MED ORDER — LACTULOSE 10 GM/15ML PO SOLN
10.0000 g | Freq: Every day | ORAL | Status: DC
  Administered 2024-07-22 – 2024-07-29 (×8): 10 g
  Filled 2024-07-22 (×8): qty 15

## 2024-07-22 MED ORDER — PROPRANOLOL HCL 20 MG PO TABS
30.0000 mg | ORAL_TABLET | Freq: Three times a day (TID) | ORAL | Status: DC
  Administered 2024-07-22 – 2024-07-27 (×18): 30 mg
  Filled 2024-07-22 (×20): qty 1

## 2024-07-22 MED ORDER — FREE WATER
300.0000 mL | Status: DC
  Administered 2024-07-22 – 2024-07-23 (×12): 300 mL

## 2024-07-22 MED ORDER — INSULIN ASPART 100 UNIT/ML IJ SOLN
0.0000 [IU] | INTRAMUSCULAR | Status: DC
  Administered 2024-07-22: 3 [IU] via SUBCUTANEOUS
  Administered 2024-07-22 (×2): 2 [IU] via SUBCUTANEOUS
  Administered 2024-07-22 (×3): 3 [IU] via SUBCUTANEOUS
  Administered 2024-07-23: 2 [IU] via SUBCUTANEOUS
  Administered 2024-07-23 (×2): 8 [IU] via SUBCUTANEOUS
  Administered 2024-07-23: 3 [IU] via SUBCUTANEOUS
  Administered 2024-07-23: 5 [IU] via SUBCUTANEOUS
  Administered 2024-07-24: 2 [IU] via SUBCUTANEOUS
  Administered 2024-07-24: 5 [IU] via SUBCUTANEOUS
  Administered 2024-07-24: 8 [IU] via SUBCUTANEOUS
  Administered 2024-07-24 (×3): 5 [IU] via SUBCUTANEOUS
  Administered 2024-07-24: 2 [IU] via SUBCUTANEOUS
  Administered 2024-07-25 (×3): 5 [IU] via SUBCUTANEOUS
  Administered 2024-07-25 (×2): 8 [IU] via SUBCUTANEOUS
  Administered 2024-07-25 – 2024-07-26 (×2): 5 [IU] via SUBCUTANEOUS
  Administered 2024-07-26: 8 [IU] via SUBCUTANEOUS
  Administered 2024-07-26: 3 [IU] via SUBCUTANEOUS
  Administered 2024-07-26: 5 [IU] via SUBCUTANEOUS
  Administered 2024-07-26 – 2024-07-27 (×2): 8 [IU] via SUBCUTANEOUS
  Administered 2024-07-27: 3 [IU] via SUBCUTANEOUS
  Administered 2024-07-27: 5 [IU] via SUBCUTANEOUS
  Filled 2024-07-22: qty 3
  Filled 2024-07-22: qty 8
  Filled 2024-07-22: qty 3
  Filled 2024-07-22: qty 2
  Filled 2024-07-22 (×2): qty 5
  Filled 2024-07-22: qty 3
  Filled 2024-07-22: qty 5
  Filled 2024-07-22: qty 2
  Filled 2024-07-22: qty 3
  Filled 2024-07-22: qty 2
  Filled 2024-07-22: qty 5
  Filled 2024-07-22: qty 8
  Filled 2024-07-22 (×2): qty 5
  Filled 2024-07-22: qty 8
  Filled 2024-07-22: qty 5
  Filled 2024-07-22: qty 8
  Filled 2024-07-22: qty 5
  Filled 2024-07-22: qty 8
  Filled 2024-07-22 (×3): qty 5
  Filled 2024-07-22: qty 2
  Filled 2024-07-22 (×3): qty 8
  Filled 2024-07-22: qty 3
  Filled 2024-07-22: qty 5
  Filled 2024-07-22 (×2): qty 3

## 2024-07-22 MED ORDER — SODIUM CHLORIDE 0.9 % IV SOLN
2.0000 g | Freq: Two times a day (BID) | INTRAVENOUS | Status: DC
  Administered 2024-07-22 – 2024-07-23 (×3): 2 g via INTRAVENOUS
  Filled 2024-07-22 (×3): qty 12.5

## 2024-07-22 NOTE — Progress Notes (Signed)
 eLink Physician-Brief Progress Note Patient Name: CHARLIS HARNER DOB: [birth year] MRN: [id number]   Date of Service  07/22/2024  HPI/Events of Note  CBG slightly above goal  eICU Interventions  Add SSI     Intervention Category Intermediate Interventions: Hyperglycemia - evaluation and treatment  Aubriauna Riner 07/22/2024, 12:17 AM

## 2024-07-22 NOTE — Progress Notes (Signed)
 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 Pt was progressing thru his hospitalization with a superimposed infection 2/2 suspected aspiration pneumonia being empirically treated.    Pt's mental status declined yesterday evening prompting overnight evaluation via RRT. Pt was reportedly found obtunded, tachycardic and tachypneic. VSS however. Abx were broadened, pt was given fluids, and stat cth revealing mildly increased ventricular size concerning for mildly progressive hydrocephalus. Neuro was reached out to who recommended repeat cth to eval progression further at 0800.    Pt's labs at 0200 this am revealed multiple areas for improvement that could contribute to pt's mental status as well. Thru the course of the day however it appears that pt's mental status did not improve, bicarb/renal function/ammonia/lactate/sodium have been worsening as well. Cth has yet to be complete.    Ccm was consulted 11/16 PM as pt remains obtunded and req ICU transfer.  Intubated, on peripheral pressors, multiple abnormal labs  Pertinent  Medical History  Htn  Significant Hospital Events: Including procedures, antibiotic start and stop dates in addition to other pertinent events   11/11 admitted to ICU  11/13 transferred to floor  11/16 CCM consulted and transferred back to ICU 11/16, intubated, on peripheral pressors 11/17-did tolerate, mental status data remain poor 11/18-tolerated weaning pressor support  Antibiotics Cefepime  11/15>> Zyvox  11/16 Vancomycin  11/15. Metronidazole  11/15-11/16. Ceftriaxone  11/15.  Interim History / Subjective:  Has remained off sedation Has still remained unresponsive Minimal  movement to painful stimuli Blood pressure still elevated, required as needed doses of meds  Objective    Blood pressure (!) 174/88, pulse 90, temperature (!) 97.3 F (36.3 C), temperature source Bladder, resp. rate (!) 23, height 5' 10 (1.778 m), weight 59.4 kg, SpO2 100%.    Vent Mode: CPAP;PSV FiO2 (%):  [40 %] 40 % Set Rate:  [14 bmp] 14 bmp Vt Set:  [440 mL] 440 mL PEEP:  [5 cmH20] 5 cmH20 Pressure Support:  [8 cmH20] 8 cmH20 Plateau Pressure:  [12 cmH20-15 cmH20] 15 cmH20   Intake/Output Summary (Last 24 hours) at 07/22/2024 0830 Last data filed at 07/22/2024 0746 Gross per 24 hour  Intake 4835.3 ml  Output 2950 ml  Net 1885.3 ml   Filed Weights   07/20/24 0500 07/21/24 0500 07/22/24 0500  Weight: 56.6 kg 57.4 kg 59.4 kg    Examination: General: Elderly, frail, HENT: Dry oral mucosa, endotracheal tube in place Lungs: Clear breath sounds bilaterally Cardiovascular: S1-S2 appreciated Abdomen: Soft, bowel sounds appreciated Extremities: No clubbing, no edema Neuro: Pupils reacting, positive deep suction reflex.  Minimal response to deep painful stimuli GU: Fair output  +2 L of fluid  I reviewed last 24 h vitals and pain scores, last 48 h intake and output, last 24 h labs and trends, and last 24 h imaging results.  I reviewed last 24 h vitals and pain scores, last 48 h intake and output, last 24 h labs and trends, and last 24 h imaging results. Sodium 151, chloride 113 BUN 56, creatinine 1.42  Resolved problem list   Assessment and Plan   Intraparenchymal hemorrhage with IVH and hydrocephalus Mental status has remained the same without sedation -Remains encephalopathic -Neuroprotective measures- normothermia, euglycemia, HOB greater than  30, head in neutral alignment, normocapnia, normoxia  Hypernatremia - On free water will increase water to every 2  Hyperammonemia -Corrected  - On lactulose-decrease to daily  Acute kidney injury - Continue to  monitor -Maintain renal perfusion -Maintain MAP greater than 65 - Renal ultrasound did not reveal any hydronephrosis   Hyperthyroidism - TSH less than 0.1, T4 of 2.2 - Will repeat today - Increase propranolol to 30 - iodide - On methimazole  Continue tube feeds  Complete 7 days of cefepime  Nutrition - Continue tube feeds  Concerning that he is not waking up  Obtain MRI today Blood pressure control   Labs   CBC: Recent Labs  Lab 07/19/24 2252 07/19/24 2336 07/20/24 0216 07/20/24 0445 07/20/24 0859 07/21/24 0510 07/21/24 0847 07/22/24 0506  WBC 13.7*  --  14.6*  --  16.8* 15.6*  --  15.3*  HGB 15.3   < > 17.1* 18.0* 17.2* 16.2 14.6 15.2  HCT 47.4   < > 54.0* 53.0* 52.9* 49.8 43.0 47.3  MCV 84.6  --  85.7  --  83.6 85.0  --  85.5  PLT 203  --  232  --  272 205  --  218   < > = values in this interval not displayed.    Basic Metabolic Panel: Recent Labs  Lab 07/19/24 1458 07/19/24 2321 07/19/24 2336 07/20/24 0216 07/20/24 0445 07/20/24 0859 07/21/24 0510 07/21/24 0847 07/22/24 0506  NA  --  155*   < > 154* 157* 155* 151* 150* 151*  K  --  3.6   < > 3.2* 3.2* 3.3* 3.5 3.9 4.0  CL  --  119*  --  114*  --  114* 111  --  113*  CO2  --  22  --  24  --  24 25  --  24  GLUCOSE  --  110*  --  93  --  140* 176*  --  202*  BUN  --  40*  --  41*  --  50* 59*  --  56*  CREATININE  --  1.75*  --  1.67*  --  2.38* 1.89*  --  1.42*  CALCIUM  --  9.2  --  9.4  --  9.2 8.9  --  9.0  MG 2.1  --   --  2.2  --   --   --   --   --   PHOS 2.7  --   --  3.6  --   --   --   --   --    < > = values in this interval not displayed.   GFR: Estimated Creatinine Clearance: 37.8 mL/min (A) (by C-G formula based on SCr of 1.42 mg/dL (H)). Recent Labs  Lab 07/18/24 2051 07/19/24 0255 07/19/24 2222 07/19/24 2252 07/20/24 0216 07/20/24 0859 07/20/24 1050 07/20/24 1834 07/21/24 0510 07/22/24 0506  PROCALCITON 3.34  --   --   --   --   --   --   --   --   --   WBC 11.8*   <  >  --    < > 14.6* 16.8*  --   --  15.6* 15.3*  LATICACIDVEN 3.9*   < > 6.6*  --   --  3.1* 2.4* 1.5  --   --    < > = values in this interval not displayed.    Liver Function Tests: Recent Labs  Lab 07/18/24 2051 07/19/24 2321  07/20/24 0216 07/22/24 0506  AST 87* 107* 122* 59*  ALT 39 41 45* 34  ALKPHOS 95 75 78 85  BILITOT 3.3* 2.7* 2.7* 0.8  PROT 8.5* 6.8 7.8 7.2  ALBUMIN 3.0* 2.1* 2.5* 2.0*   No results for input(s): LIPASE, AMYLASE in the last 168 hours. Recent Labs  Lab 07/19/24 0255 07/22/24 0506  AMMONIA 59* 23    ABG    Component Value Date/Time   PHART 7.491 (H) 07/21/2024 0847   PCO2ART 34.3 07/21/2024 0847   PO2ART 63 (L) 07/21/2024 0847   HCO3 26.2 07/21/2024 0847   TCO2 27 07/21/2024 0847   ACIDBASEDEF 0.8 07/18/2024 2250   O2SAT 94 07/21/2024 0847     Coagulation Profile: No results for input(s): INR, PROTIME in the last 168 hours.   Cardiac Enzymes: No results for input(s): CKTOTAL, CKMB, CKMBINDEX, TROPONINI in the last 168 hours.  HbA1C: Hgb A1c MFr Bld  Date/Time Value Ref Range Status  07/16/2024 06:01 AM 5.6 4.8 - 5.6 % Final    Comment:    (NOTE) Diagnosis of Diabetes The following HbA1c ranges recommended by the American Diabetes Association (ADA) may be used as an aid in the diagnosis of diabetes mellitus.  Hemoglobin             Suggested A1C NGSP%              Diagnosis  <5.7                   Non Diabetic  5.7-6.4                Pre-Diabetic  >6.4                   Diabetic  <7.0                   Glycemic control for                       adults with diabetes.      CBG: Recent Labs  Lab 07/21/24 1954 07/21/24 2334 07/22/24 0045 07/22/24 0337 07/22/24 0730  GLUCAP 203* 194* 190* 171* 156*    Review of Systems:   Unable to provide any history  Past Medical History:  He,  has a past medical history of Hypertension.   Surgical History:  No past surgical history on file.   Social  History:   reports that he has quit smoking. He has never used smokeless tobacco. He reports current alcohol use of about 1.0 standard drink of alcohol per week. He reports that he does not use drugs.   Family History:  His family history is not on file.   Allergies Allergies  Allergen Reactions   Clonidine  Other (See Comments)    Weakness, lower extremity paralysis feeling   Penicillins Shortness Of Breath    Patient reports reaction occurred as a teenager and he experienced shortness of breath not requiring hospitalization.    Amlodipine Swelling   Lisinopril Swelling   Metoprolol Swelling   Tylenol  [Acetaminophen ]     The patient is critically ill with multiple organ systems failure and requires high complexity decision making for assessment and support, frequent evaluation and titration of therapies, application of advanced monitoring technologies and extensive interpretation of multiple databases. Critical Care Time devoted to patient care services described in this note independent of APP/resident time (if applicable)  is 32 minutes.   Jennet Epley MD  Pulmonary  Critical Care Personal pager: See Amion If unanswered, please page CCM On-call: #[phone]

## 2024-07-22 NOTE — Progress Notes (Signed)
 MD called at bedside to assess pt's right arm after a big dark bruise with blisters was noted above IV site, IV flushed and great blood return was noted, IV has not been used for 48hrs, MD ordered to d/c IV and consult pharmacy, see new orders for nitroglycerin cream application.

## 2024-07-22 NOTE — Progress Notes (Signed)
 OT Cancellation Note  Patient Details Name: Lawrence Barnett MRN: [id number] DOB: Sep 16, 1950   Cancelled Treatment:    Reason Eval/Treat Not Completed: Medical issues which prohibited therapy. RN hold, pt remains intubated and unresponsive. Will continue efforts as appropriate.   [name], OTR/L [facility] Acute Rehabilitation Services [phone] Secure Chat Preferred  [name] 07/22/2024, 12:40 PM

## 2024-07-22 NOTE — Progress Notes (Signed)
 PT Cancellation and Discharge Note  Patient Details Name: Lawrence Barnett MRN: [id number] DOB: [birth year]   Cancelled Treatment:    Reason Eval/Treat Not Completed: (P) Medical issues which prohibited therapy Pt has had a change in medical status and has been inappropriate for therapy the last 3 days. Per department protocol PT will discontinue orders. Please reorder when pt becomes appropriate.   Lawrence Barnett PT, DPT Acute Rehabilitation Services Please use secure chat or  Call Office [phone]    [name] [name] 07/22/2024, 1:12 PM

## 2024-07-22 NOTE — Progress Notes (Addendum)
 SLP Cancellation Note  Patient Details Name: Lawrence Barnett MRN: [id number] DOB: May 30, 1951   Cancelled treatment:       Reason Eval/Treat Not Completed: Medical issues which prohibited therapy. Continues to be intubated, unresponsive. SLP to s/o at this time. Please reorder SLP for swallow evaluation and speech-language cognitive evaluation if appropriate.  Norleen IVAR Blase, MA, CCC-SLP Speech Therapy

## 2024-07-22 NOTE — Progress Notes (Signed)
 RT transported pt on ventilator from 3M05 to MRI and back to 3M05 without any complications. RN at bedside.

## 2024-07-23 ENCOUNTER — Inpatient Hospital Stay (HOSPITAL_COMMUNITY)

## 2024-07-23 DIAGNOSIS — I610 Nontraumatic intracerebral hemorrhage in hemisphere, subcortical: Secondary | ICD-10-CM | POA: Diagnosis not present

## 2024-07-23 DIAGNOSIS — I10 Essential (primary) hypertension: Secondary | ICD-10-CM | POA: Diagnosis not present

## 2024-07-23 DIAGNOSIS — A419 Sepsis, unspecified organism: Secondary | ICD-10-CM

## 2024-07-23 DIAGNOSIS — I6389 Other cerebral infarction: Secondary | ICD-10-CM

## 2024-07-23 DIAGNOSIS — I615 Nontraumatic intracerebral hemorrhage, intraventricular: Secondary | ICD-10-CM | POA: Diagnosis not present

## 2024-07-23 DIAGNOSIS — E876 Hypokalemia: Secondary | ICD-10-CM | POA: Diagnosis not present

## 2024-07-23 DIAGNOSIS — I959 Hypotension, unspecified: Secondary | ICD-10-CM

## 2024-07-23 DIAGNOSIS — E43 Unspecified severe protein-calorie malnutrition: Secondary | ICD-10-CM | POA: Insufficient documentation

## 2024-07-23 DIAGNOSIS — E870 Hyperosmolality and hypernatremia: Secondary | ICD-10-CM | POA: Diagnosis not present

## 2024-07-23 DIAGNOSIS — T361X5A Adverse effect of cephalosporins and other beta-lactam antibiotics, initial encounter: Secondary | ICD-10-CM

## 2024-07-23 DIAGNOSIS — I739 Peripheral vascular disease, unspecified: Secondary | ICD-10-CM

## 2024-07-23 DIAGNOSIS — G919 Hydrocephalus, unspecified: Secondary | ICD-10-CM | POA: Diagnosis not present

## 2024-07-23 LAB — CBC
HCT: 46.9 % (ref 39.0–52.0)
Hemoglobin: 14.6 g/dL (ref 13.0–17.0)
MCH: 27.2 pg (ref 26.0–34.0)
MCHC: 31.1 g/dL (ref 30.0–36.0)
MCV: 87.3 fL (ref 80.0–100.0)
Platelets: 182 K/uL (ref 150–400)
RBC: 5.37 MIL/uL (ref 4.22–5.81)
RDW: 15.3 % (ref 11.5–15.5)
WBC: 12.8 K/uL — ABNORMAL HIGH (ref 4.0–10.5)
nRBC: 0.4 % — ABNORMAL HIGH (ref 0.0–0.2)

## 2024-07-23 LAB — BASIC METABOLIC PANEL WITH GFR
Anion gap: 11 (ref 5–15)
BUN: 45 mg/dL — ABNORMAL HIGH (ref 8–23)
CO2: 23 mmol/L (ref 22–32)
Calcium: 8.6 mg/dL — ABNORMAL LOW (ref 8.9–10.3)
Chloride: 110 mmol/L (ref 98–111)
Creatinine, Ser: 1.08 mg/dL (ref 0.61–1.24)
GFR, Estimated: >60 mL/min (ref >60)
Glucose, Bld: 222 mg/dL — ABNORMAL HIGH (ref 70–99)
Potassium: 4.5 mmol/L (ref 3.5–5.1)
Sodium: 144 mmol/L (ref 135–145)

## 2024-07-23 LAB — CULTURE, BLOOD (ROUTINE X 2)
Culture: NO GROWTH
Culture: NO GROWTH

## 2024-07-23 LAB — GLUCOSE, CAPILLARY
Glucose-Capillary: 136 mg/dL — ABNORMAL HIGH (ref 70–99)
Glucose-Capillary: 140 mg/dL — ABNORMAL HIGH (ref 70–99)
Glucose-Capillary: 193 mg/dL — ABNORMAL HIGH (ref 70–99)
Glucose-Capillary: 206 mg/dL — ABNORMAL HIGH (ref 70–99)
Glucose-Capillary: 263 mg/dL — ABNORMAL HIGH (ref 70–99)
Glucose-Capillary: 277 mg/dL — ABNORMAL HIGH (ref 70–99)

## 2024-07-23 LAB — T4, FREE: Free T4: 1.14 ng/dL — ABNORMAL HIGH (ref 0.61–1.12)

## 2024-07-23 LAB — TSH: TSH: <0.1 u[IU]/mL — ABNORMAL LOW (ref 0.350–4.500)

## 2024-07-23 MED ORDER — SODIUM CHLORIDE 0.9 % IV SOLN
100.0000 mg | Freq: Two times a day (BID) | INTRAVENOUS | Status: DC
  Filled 2024-07-23: qty 100

## 2024-07-23 MED ORDER — METHIMAZOLE 5 MG PO TABS
30.0000 mg | ORAL_TABLET | Freq: Two times a day (BID) | ORAL | Status: DC
  Administered 2024-07-23 – 2024-08-03 (×23): 30 mg
  Filled 2024-07-23 (×6): qty 3
  Filled 2024-07-23: qty 6
  Filled 2024-07-23 (×2): qty 3
  Filled 2024-07-23 (×2): qty 6
  Filled 2024-07-23 (×5): qty 3
  Filled 2024-07-23: qty 6
  Filled 2024-07-23 (×3): qty 3
  Filled 2024-07-23 (×2): qty 6
  Filled 2024-07-23 (×2): qty 3
  Filled 2024-07-23 (×2): qty 6
  Filled 2024-07-23 (×4): qty 3

## 2024-07-23 MED ORDER — FREE WATER
200.0000 mL | Freq: Four times a day (QID) | Status: DC
  Administered 2024-07-23 – 2024-07-24 (×4): 200 mL

## 2024-07-23 MED ORDER — SODIUM CHLORIDE 0.9 % IV SOLN
100.0000 mg | Freq: Two times a day (BID) | INTRAVENOUS | Status: AC
  Administered 2024-07-23 – 2024-07-27 (×9): 100 mg via INTRAVENOUS
  Filled 2024-07-23 (×9): qty 100

## 2024-07-23 MED ORDER — FUROSEMIDE 10 MG/ML IJ SOLN
40.0000 mg | Freq: Once | INTRAMUSCULAR | Status: AC
  Administered 2024-07-23: 40 mg via INTRAVENOUS
  Filled 2024-07-23: qty 4

## 2024-07-23 NOTE — Progress Notes (Signed)
  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

## 2024-07-23 NOTE — Progress Notes (Addendum)
 STROKE TEAM PROGRESS NOTE    SIGNIFICANT HOSPITAL EVENTS 11/11: Patient admitted with right basal ganglia ICH with IVH 11/13: Patient transferred out of the ICU  INTERIM HISTORY/SUBJECTIVE Patient remains critically ill.  Off pressors, neurology reengaged because he is not waking up. MRI shows numerous new punctate and small acute to early subacute infarcts predominantly involving the bilateral centrum semiovale and deep white matter, with additional involvement of the bilateral basal ganglia, cerebral lobes, right cerebellum and right insula consistent with multifocal acute ischemia. 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  Niece called- no answer, voicemail left, will attempt later today   OBJECTIVE  CBC    Component Value Date/Time   WBC 12.8 (H) 07/23/2024 0332   RBC 5.37 07/23/2024 0332   HGB 14.6 07/23/2024 0332   HGB 16.2 10/07/2012 1618   HCT 46.9 07/23/2024 0332   HCT 47.8 10/07/2012 1618   PLT 182 07/23/2024 0332   PLT 306 10/07/2012 1618   MCV 87.3 07/23/2024 0332   MCV 92 10/07/2012 1618   MCH 27.2 07/23/2024 0332   MCHC 31.1 07/23/2024 0332   RDW 15.3 07/23/2024 0332   RDW 13.9 10/07/2012 1618   LYMPHSABS 2.1 07/14/2024 1904   MONOABS 0.4 07/14/2024 1904   EOSABS 0.1 07/14/2024 1904   BASOSABS 0.0 07/14/2024 1904    BMET    Component Value Date/Time   NA 144 07/23/2024 0332   NA 139 10/08/2012 0431   K 4.5 07/23/2024 0332   K 4.0 10/08/2012 0431   CL 110 07/23/2024 0332   CL 107 10/08/2012 0431   CO2 23 07/23/2024 0332   CO2 25 10/08/2012 0431   GLUCOSE 222 (H) 07/23/2024 0332   GLUCOSE 133 (H) 10/08/2012 0431   BUN 45 (H) 07/23/2024 0332   BUN 10 10/08/2012 0431   CREATININE 1.08 07/23/2024 0332   CREATININE 0.83 10/08/2012 0431   CALCIUM 8.6 (L) 07/23/2024 0332   CALCIUM 9.4 10/08/2012 0431   GFRNONAA >60 07/23/2024 0332   GFRNONAA >60 10/08/2012 0431    IMAGING past 24 hours MR BRAIN  WO CONTRAST Result Date: 07/22/2024 EXAM: MRI BRAIN WITHOUT CONTRAST 07/22/2024 12:13:00 PM TECHNIQUE: Multiplanar multisequence MRI of the head/brain was performed without the administration of intravenous contrast. COMPARISON: MR Head Without and With IV Contrast 07/16/2024. CT head 07/19/2024. CLINICAL HISTORY: Stroke, follow up. FINDINGS: BRAIN AND VENTRICLES: Numerous new punctate and small foci of restricted diffusion predominantly involving the bilateral centrum semiovale and deep white matter with associated T2/FLAIR hyperintensity. There are additional scattered diffusion-restricting foci involving the bilateral basal ganglia, bilateral frontal, parietal, occipital, and temporal lobes as well as the right cerebellar hemisphere and posterior right insula. 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 There is overall similar effacement of the right lateral and third ventricles. Re-demonstrated foci of susceptibility notable within sthe left parietal lobe, right temporal lobe, left thalamus and pons in keeping with the sequela of chronic hemorrhages. Slightly increased intraventricular hemorrhage layering within the left occipital horn which could reflect redistribution. Otherwise overall similar intraventricular hemorrhage within the right occipital horn and fourth ventricle. There is moderate to severe subcortical and periventricular T2 and FLAIR signal hyperintensity likely reflecting sequelae of acute and chronic ischemia, increased since prior exam. The sella is unremarkable. Normal flow voids. ORBITS: No acute abnormality. SINUSES AND MASTOIDS: Opacified left maxillary sinus with diffusion restricting contents. There is scattered mucosal thickening of the bilateral frontal, ethmoid, and right maxillary paranasal sinuses. Trace bilateral  mastoid effusions. BONES AND SOFT TISSUES: Normal marrow signal. No acute soft tissue abnormality.  IMPRESSION: 1. Since 07/16/2024, numerous new punctate and small acute to early subacute infarcts predominantly involving the bilateral centrum semiovale and deep white matter, with additional involvement of the bilateral basal ganglia, cerebral lobes, right cerebellum and right insula consistent with multifocal acute ischemia. 2. 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 3. Left maxillary sinus opacified with diffusion-restricting contents, correlate for acute sinusitis. Electronically signed by: andrew bybordi 07/22/2024 01:41 PM EST RP Workstation: [id number]     Vitals:   07/23/24 0755 07/23/24 0800 07/23/24 0900 07/23/24 1000  BP:  (!) 157/89 (!) 142/90 136/86  Pulse:  79 82 84  Resp:  20 18 20   Temp: 98 F (36.7 C)     TempSrc: Axillary     SpO2:  100% 100% 100%  Weight:      Height:         PHYSICAL EXAM General:   , well-nourished, well-developed elderly patient in no acute distress.  Intubated Psych:  Mood and affect appropriate for situation CV: Regular rate and rhythm on monitor Respiratory:  Regular, unlabored respirations on room air GI: Abdomen soft and nontender   NEURO:  Mental Status: Patient is intubated.  Not on any sedation.  Eyes closed.  Unresponsive  can be barely aroused.  Not following commands.  Speech/Language: speech cannot be ascertained.  Cranial Nerves:  II: PERRL. Visual fields full.  III, IV, VI: Right gaze preference, able to cross midline V: Sensation is intact to light touch and symmetrical to face.  VII: Left facial droop VIII: hearing intact to voice. IX, X: Voice is dysarthric XII: tongue is midline without fasciculations. Motor: Able to move right upper and lower extremities with antigravity strength, no movement of left upper extremity but is able to flicker left lower extremity to noxious Tone: is normal and bulk is normal Sensation-sensation diminished on the left Coordination:  Unable to test gait- deferred      ASSESSMENT/PLAN  [name] is a 73 y.o. male with history of hypertension admitted for left-sided hemiplegia.  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  He did not pass his swallow study and will have cortrak inserted NIH on Admission 17  ICH:  right ganglia/thalamus ICH with IVH, etiology: Likely hypertensive Acute ischemic infarcts: numerous new punctate and small acute to early subacute infarcts, watershed territory; etiology likely synchronous bilateral watershed small vessel disease infarcts related to hypotension   from sepsis There may also be a component of encephalopathy related to cefepime  ,sepsis Code Stroke CT head 7 mm acute IPH centered at right thalamus with IVH, no hydrocephalus CTA head & neck no LVO or hemodynamically significant stenosis, no vascular abnormality underlying right thalamic hemorrhage  Follow-up head CT slight interval increase in size of right thalamic hemorrhage, slightly increased IVH MRI acute right thalamic IPH with IVH, stable, no evidence of underlying mass Repeat MRI - numerous new punctate and small acute to early subacute infarcts predominantly involving the bilateral centrum semiovale and deep white matter, with additional involvement of the bilateral basal ganglia, cerebral lobes, right cerebellum and right insula consistent with multifocal acute ischemia. 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  2D Echo EF 55-60% EEG LDL 73 HgbA1c 5.6 UDS neg VTE prophylaxis - heparin subq No antithrombotic prior to admission, now on No antithrombotic secondary to IPH Therapy recommendations:  CIR Disposition:  Pending  Hypertension Home meds: None BP stable Now stable on the high end On losartan 100, hydralazine  50mg  TID BP goal < 160 Long-term BP goal normotensive  Lipid management Home meds: None LDL 73, goal <  70 Add statin at discharge  Fever with tachycardia ?  Aspiration Afebrile for 24 hours Concern for aspiration CXR negative Keep NPO and close monitoring On  antibiotics Switch cefepime  to doxycycline   AKI Hypernatremia Na 145--148--152 Cre 0.84-0.99-1.35-1.08 Close monitoring  Dysphagia Patient has post-stroke dysphagia, SLP consulted  Was on soft diet with nectar thick liquids, now n.p.o. due to concern of aspiration Continue IVF   Other Stroke Risk Factors Advanced age   Hospital day # 9  Patient seen and examined by NP/APP with MD. MD to update note as needed.   Jorene Last, DNP, FNP-BC [facility] Pager: [phone]  I have personally obtained history,examined this patient, reviewed notes, independently viewed imaging studies, participated in medical decision making and plan of care.ROS completed by me personally and pertinent positives fully documented  I have made any additions or clarifications directly to the above note. Agree with note above.  Patient neurological exam appears to have declined significantly over the past several days in the setting of sepsis hypertension antibiotic usage.  MRI scan shows multiple tiny punctate bilateral mostly subcortical watershed distribution infarcts likely from small vessel disease and hypotension from sepsis.  His mental status seems to be out of proportion to the extent of infarcts and is likely multifactorial related to underlying encephalopathy from sepsis as well as possibly antibiotic usage of cefepime .  Recommend check EEG for seizure activity.  Change cefepime  to alternative antibiotic if possible.  No family available at the bedside for discussion.  Discussed with Dr. Delmon.  Prognosis is guarded This patient is critically ill and at significant risk of neurological worsening, death and care requires constant monitoring of vital signs, hemodynamics,respiratory and cardiac monitoring, extensive review of  multiple databases, frequent neurological assessment, discussion with family, other specialists and medical decision making of high complexity.I have made any additions or clarifications directly to the above note.This critical care time does not reflect procedure time, or teaching time or supervisory time of PA/NP/Med Resident etc but could involve care discussion time.  I spent 35 minutes of neurocritical care time  in the care of  this patient.     Eather Popp, MD Medical Director [facility] Pager: [phone] 07/23/2024 5:18 PM   To contact Stroke Continuity provider, please refer to [email or web address]. After hours, contact General Neurology

## 2024-07-23 NOTE — Procedures (Signed)
 Patient Name: Lawrence Barnett  MRN: [id number]  Epilepsy Attending: Pastor Falling  Referring Physician/Provider: No ref. provider found      Date: 07/23/2024  Duration: 23 minutes   Patient history: 73 year old man with basal ganglia stroke. Remains encephalopathic. EEG to rule out seizure   Level of alertness: Intubated and sedated  AEDs during EEG study: None   Technical aspects: This EEG study was done with scalp electrodes positioned according to the 10-20 International system of electrode placement. Electrical activity was reviewed with band pass filter of 1-70Hz , sensitivity of 7 uV/mm, display speed of 38mm/sec with a 60Hz  notched filter applied as appropriate. EEG data were recorded continuously and digitally stored.  Video monitoring was available and reviewed as appropriate.  Description: EEG showed continuous generalized polymorphic sharply contoured 3 to 6 Hz theta-delta slowing. Hyperventilation and photic stimulation were not performed.     ABNORMALITY - Continuous slow, generalized   IMPRESSION:  This study is suggestive of moderate to severe diffuse encephalopathy, nonspecific etiology but likely related to sedation, toxic-metabolic etiology, hypoxic brain injury.  No seizures or epileptiform discharges were seen throughout the recording.   Pastor Falling MD Neurology

## 2024-07-23 NOTE — Plan of Care (Signed)
  Problem: Education: Goal: Knowledge of disease or condition will improve Outcome: Progressing Goal: Knowledge of secondary prevention will improve (MUST DOCUMENT ALL) Outcome: Progressing Goal: Knowledge of patient specific risk factors will improve (DELETE if not current risk factor) Outcome: Progressing   Problem: Intracerebral Hemorrhage Tissue Perfusion: Goal: Complications of Intracerebral Hemorrhage will be minimized Outcome: Progressing   Problem: Coping: Goal: Will verbalize positive feelings about self Outcome: Progressing Goal: Will identify appropriate support needs Outcome: Progressing   Problem: Self-Care: Goal: Ability to participate in self-care as condition permits will improve Outcome: Progressing Goal: Verbalization of feelings and concerns over difficulty with self-care will improve Outcome: Progressing Goal: Ability to communicate needs accurately will improve Outcome: Progressing   Problem: Nutrition: Goal: Risk of aspiration will decrease Outcome: Progressing Goal: Dietary intake will improve Outcome: Progressing   Problem: Clinical Measurements: Goal: Ability to maintain clinical measurements within normal limits will improve Outcome: Progressing Goal: Will remain free from infection Outcome: Progressing Goal: Diagnostic test results will improve Outcome: Progressing Goal: Respiratory complications will improve Outcome: Progressing Goal: Cardiovascular complication will be avoided Outcome: Progressing   Problem: Activity: Goal: Risk for activity intolerance will decrease Outcome: Progressing   Problem: Elimination: Goal: Will not experience complications related to bowel motility Outcome: Progressing Goal: Will not experience complications related to urinary retention Outcome: Progressing   Problem: Coping: Goal: Level of anxiety will decrease Outcome: Progressing   Problem: Safety: Goal: Ability to remain free from injury will  improve Outcome: Progressing   Problem: Skin Integrity: Goal: Risk for impaired skin integrity will decrease Outcome: Progressing   Problem: Respiratory: Goal: Ability to maintain adequate ventilation will improve Outcome: Progressing   Problem: Metabolic: Goal: Ability to maintain appropriate glucose levels will improve Outcome: Progressing   Problem: Nutritional: Goal: Maintenance of adequate nutrition will improve Outcome: Progressing Goal: Progress toward achieving an optimal weight will improve Outcome: Progressing   Problem: Skin Integrity: Goal: Risk for impaired skin integrity will decrease Outcome: Progressing

## 2024-07-23 NOTE — Progress Notes (Signed)
  Progress Note   Date: 07/22/2024  Patient Name: Lawrence Barnett        MRN#: [id number]  Clarification of diagnosis:  Severe malnutrition

## 2024-07-23 NOTE — Progress Notes (Signed)
  Progress Note   Date: 07/21/2024  Patient Name: Lawrence Barnett        MRN#: [id number]  Clarification of diagnosis:  Hypoxemic respiratory failure Patient was placed on oxygen supplementation with increased work of breathing, sonorous respirations, airway congestion, tachypneic

## 2024-07-23 NOTE — Progress Notes (Signed)
 Nutrition Follow-up  DOCUMENTATION CODES:  Severe malnutrition in context of social or environmental circumstances, Underweight  INTERVENTION:  TF via Cortrak: Osmolite 1.5 at goal rate of 32ml/hr ( per day) Provides 1800 kcal, 75g protein and free water daily   Free water flush 200ml q6h ( total daily) Total free water (TF+FWF)-   NUTRITION DIAGNOSIS:  Severe Malnutrition related to social / environmental circumstances as evidenced by severe fat depletion, severe muscle depletion. 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 Throughout admission, pt became obtunded, tachycardic and tachypneic requiring transfer to ICU and subsequent intubation. PMH significant for HTN.  11/11: admitted with ICH 11/16: transferred back to ICU, intubated; repeat CT head- slight increase in IVH 11/17: Cortrak placed 11/19: MRI brain- Acute ischemic infarcts: numerous new punctate and small acute to early subacute infarcts, watershed territory; etiology likely hypotension   Remains intubated on vent support.  He remains encephalopathic.  Plan for cEEG. Pt found to have hypothyroidism. Receiving medical management.   Tolerating tube feed at goal via Cortrak. Having loose stool though on lactulose.   Admit weight: 67.4 kg Current weight: 64.2 kg  Drains/lines: Cortrak FMS  Medications:  pepcid , solu-cortef, SSI 0-15 units q4h, lactulose 10g daily, potassium iodide q6h, thiamine , IV abx  Labs:  BUN 45 TSH <0.100 T4, free 1.14 CBG's 114-193 x24 hours  Diet Order:   Diet Order             Diet NPO time  specified  Diet effective now                   EDUCATION NEEDS:   No education needs have been identified at this time  Skin:  Skin Assessment: Reviewed RN Assessment  Last BM:  x 12 hours via FMS + lactulose  Height:   Ht Readings from Last 1 Encounters:  07/20/24 5' 10 (1.778 m)    Weight:   Wt Readings from Last 1 Encounters:  07/23/24 64.2 kg   BMI:  Body mass index is 20.31 kg/m.  Estimated Nutritional Needs:   Kcal:  1700-1900  Protein:  75-90g  Fluid:  >/=1.7L  Lawrence Barnett, RDN, LDN Clinical Nutrition See AMiON for contact information.

## 2024-07-23 NOTE — Progress Notes (Signed)
 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 Pt was progressing thru his hospitalization with a superimposed infection 2/2 suspected aspiration pneumonia being empirically treated.    Pt's mental status declined yesterday evening prompting overnight evaluation via RRT. Pt was reportedly found obtunded, tachycardic and tachypneic. VSS however. Abx were broadened, pt was given fluids, and stat cth revealing mildly increased ventricular size concerning for mildly progressive hydrocephalus. Neuro was reached out to who recommended repeat cth to eval progression further at 0800.    Pt's labs at 0200 this am revealed multiple areas for improvement that could contribute to pt's mental status as well. Thru the course of the day however it appears that pt's mental status did not improve, bicarb/renal function/ammonia/lactate/sodium have been worsening as well. Cth has yet to be complete.    Ccm was consulted 11/16 PM as pt remains obtunded and req ICU transfer.  Intubated, on peripheral pressors, multiple abnormal labs  Pertinent  Medical History  Htn  Significant Hospital Events: Including procedures, antibiotic start and stop dates in addition to other pertinent events   11/11 admitted to ICU  11/13 transferred to floor  11/16 CCM consulted and transferred back to ICU 11/16, intubated, on peripheral pressors 11/17-did tolerate, mental status data remain poor 11/18-tolerated weaning pressor support  Antibiotics Cefepime  11/15>> Zyvox 11/16 Vancomycin  11/15. Metronidazole 11/15-11/16. Ceftriaxone 11/15.  Interim History / Subjective:   Remains off sedation MRI results noted Updated patient's  niece  Objective    Blood pressure (!) 142/79, pulse 82, temperature 98 F (36.7 C), temperature source Axillary, resp. rate 20, height 5' 10 (1.778 m), weight 64.2 kg, SpO2 100%.    Vent Mode: PSV;CPAP FiO2 (%):  [40 %] 40 % Set Rate:  [14 bmp] 14 bmp Vt Set:  [440 mL] 440 mL PEEP:  [5 cmH20] 5 cmH20 Pressure Support:  [8 cmH20] 8 cmH20 Plateau Pressure:  [13 cmH20-16 cmH20] 16 cmH20   Intake/Output Summary (Last 24 hours) at 07/23/2024 0853 Last data filed at 07/23/2024 0800 Gross per 24 hour  Intake 5115.62 ml  Output 2300 ml  Net 2815.62 ml   Filed Weights   07/21/24 0500 07/22/24 0500 07/23/24 0500  Weight: 57.4 kg 59.4 kg 64.2 kg    Examination: General: Elderly, frail HENT: Dry oral mucosa, endotracheal tube in place Lungs: Clear breath sounds bilaterally Cardiovascular: S1-S2 appreciated Abdomen: Bowel sounds appreciated Extremities: No clubbing, no edema pupils reactive Neuro: Still has minimal response to deep painful stimuli  5 L positive fluid balance  I reviewed last 24 h vitals and pain scores, last 48 h intake and output, last 24 h labs and trends, and last 24 h imaging results.  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    Basic Metabolic Panel: Recent Labs  Lab 07/19/24 1458 07/19/24 2321 07/20/24 0216 07/20/24 0445 07/20/24 0859 07/21/24 0510 07/21/24 0847 07/22/24 0506 07/23/24 0332  NA  --    < > 154*   < > 155* 151* 150* 151* 144  K  --    < > 3.2*   < > 3.3* 3.5 3.9 4.0 4.5  CL  --    < > 114*  --  114* 111  --  113* 110  CO2  --    < > 24  --  24 25  --  24 23  GLUCOSE  --    < > 93  --  140* 176*  --  202* 222*  BUN  --    < > 41*  --  50* 59*  --  56* 45*  CREATININE  --    < > 1.67*  --  2.38* 1.89*  --  1.42* 1.08  CALCIUM  --    < > 9.4  --  9.2 8.9  --  9.0 8.6*  MG 2.1  --  2.2  --   --   --   --   --   --   PHOS 2.7  --  3.6  --   --   --   --   --   --    < > = values in this interval not displayed.   GFR: Estimated Creatinine Clearance: 53.7 mL/min (by C-G formula based on SCr of 1.08 mg/dL). Recent Labs  Lab 07/18/24 2051 07/19/24 0255 07/19/24 2222 07/19/24 2252 07/20/24 0859 07/20/24 1050 07/20/24 1834 07/21/24 0510 07/22/24 0506 07/23/24 0332  PROCALCITON 3.34  --   --   --   --   --   --   --   --   --   WBC 11.8*   < >  --    < > 16.8*  --   --  15.6* 15.3* 12.8*  LATICACIDVEN 3.9*   < > 6.6*  --  3.1* 2.4* 1.5  --   --   --    < > = values in this interval not displayed.    Liver Function Tests: Recent Labs  Lab 07/18/24 2051 07/19/24 2321 07/20/24 0216 07/22/24 0506  AST 87* 107* 122* 59*  ALT 39 41 45* 34  ALKPHOS 95 75 78 85  BILITOT 3.3* 2.7* 2.7* 0.8  PROT 8.5* 6.8 7.8 7.2  ALBUMIN 3.0* 2.1* 2.5* 2.0*   No results for input(s): LIPASE, AMYLASE in the last 168 hours. Recent Labs   Lab 07/19/24 0255 07/22/24 0506  AMMONIA 59* 23    ABG    Component Value Date/Time   PHART 7.491 (H) 07/21/2024 0847   PCO2ART 34.3 07/21/2024 0847   PO2ART 63 (L) 07/21/2024 9152  HCO3 26.2 07/21/2024 0847   TCO2 27 07/21/2024 0847   ACIDBASEDEF 0.8 07/18/2024 2250   O2SAT 94 07/21/2024 0847     Coagulation Profile: No results for input(s): INR, PROTIME in the last 168 hours.   Cardiac Enzymes: No results for input(s): CKTOTAL, CKMB, CKMBINDEX, TROPONINI in the last 168 hours.  HbA1C: Hgb A1c MFr Bld  Date/Time Value Ref Range Status  07/16/2024 06:01 AM 5.6 4.8 - 5.6 % Final    Comment:    (NOTE) Diagnosis of Diabetes The following HbA1c ranges recommended by the American Diabetes Association (ADA) may be used as an aid in the diagnosis of diabetes mellitus.  Hemoglobin             Suggested A1C NGSP%              Diagnosis  <5.7                   Non Diabetic  5.7-6.4                Pre-Diabetic  >6.4                   Diabetic  <7.0                   Glycemic control for                       adults with diabetes.      CBG: Recent Labs  Lab 07/22/24 1525 07/22/24 2007 07/22/24 2323 07/23/24 0343 07/23/24 0757  GLUCAP 149* 167* 114* 140* 193*    Review of Systems:   Unable to provide any history  Past Medical History:  He,  has a past medical history of Hypertension.   Surgical History:  No past surgical history on file.   Social History:   reports that he has quit smoking. He has never used smokeless tobacco. He reports current alcohol use of about 1.0 standard drink of alcohol per week. He reports that he does not use drugs.   Family History:  His family history is not on file.   Allergies Allergies  Allergen Reactions   Clonidine  Other (See Comments)    Weakness, lower extremity paralysis feeling   Penicillins Shortness Of Breath    Patient reports reaction occurred as a teenager and he experienced shortness of  breath not requiring hospitalization.    Amlodipine Swelling   Lisinopril Swelling   Metoprolol Swelling   Tylenol  [Acetaminophen ]     The patient is critically ill with multiple organ systems failure and requires high complexity decision making for assessment and support, frequent evaluation and titration of therapies, application of advanced monitoring technologies and extensive interpretation of multiple databases. Critical Care Time devoted to patient care services described in this note independent of APP/resident time (if applicable)  is 35 minutes.   Jennet Epley MD [facility] Personal pager: See Amion If unanswered, please page CCM On-call: #[phone]

## 2024-07-23 NOTE — Progress Notes (Signed)
 Routine EEG complete. Results pending.

## 2024-07-24 DIAGNOSIS — E870 Hyperosmolality and hypernatremia: Secondary | ICD-10-CM | POA: Diagnosis not present

## 2024-07-24 DIAGNOSIS — I610 Nontraumatic intracerebral hemorrhage in hemisphere, subcortical: Secondary | ICD-10-CM | POA: Diagnosis not present

## 2024-07-24 DIAGNOSIS — I6389 Other cerebral infarction: Secondary | ICD-10-CM | POA: Diagnosis not present

## 2024-07-24 DIAGNOSIS — E876 Hypokalemia: Secondary | ICD-10-CM | POA: Diagnosis not present

## 2024-07-24 DIAGNOSIS — I615 Nontraumatic intracerebral hemorrhage, intraventricular: Secondary | ICD-10-CM | POA: Diagnosis not present

## 2024-07-24 DIAGNOSIS — G919 Hydrocephalus, unspecified: Secondary | ICD-10-CM | POA: Diagnosis not present

## 2024-07-24 DIAGNOSIS — I10 Essential (primary) hypertension: Secondary | ICD-10-CM | POA: Diagnosis not present

## 2024-07-24 LAB — POCT I-STAT 7, (LYTES, BLD GAS, ICA,H+H)
Acid-Base Excess: 6 mmol/L — ABNORMAL HIGH (ref 0.0–2.0)
Bicarbonate: 28.8 mmol/L — ABNORMAL HIGH (ref 20.0–28.0)
Calcium, Ion: 1.27 mmol/L (ref 1.15–1.40)
HCT: 42 % (ref 39.0–52.0)
Hemoglobin: 14.3 g/dL (ref 13.0–17.0)
O2 Saturation: 100 %
Patient temperature: 98.6
Potassium: 4.8 mmol/L (ref 3.5–5.1)
Sodium: 149 mmol/L — ABNORMAL HIGH (ref 135–145)
TCO2: 30 mmol/L (ref 22–32)
pCO2 arterial: 36.3 mmHg (ref 32–48)
pH, Arterial: 7.507 — ABNORMAL HIGH (ref 7.35–7.45)
pO2, Arterial: 180 mmHg — ABNORMAL HIGH (ref 83–108)

## 2024-07-24 LAB — GLUCOSE, CAPILLARY
Glucose-Capillary: 127 mg/dL — ABNORMAL HIGH (ref 70–99)
Glucose-Capillary: 209 mg/dL — ABNORMAL HIGH (ref 70–99)
Glucose-Capillary: 232 mg/dL — ABNORMAL HIGH (ref 70–99)
Glucose-Capillary: 241 mg/dL — ABNORMAL HIGH (ref 70–99)
Glucose-Capillary: 242 mg/dL — ABNORMAL HIGH (ref 70–99)
Glucose-Capillary: 255 mg/dL — ABNORMAL HIGH (ref 70–99)

## 2024-07-24 LAB — SYPHILIS: RPR W/REFLEX TO RPR TITER AND TREPONEMAL ANTIBODIES, TRADITIONAL SCREENING AND DIAGNOSIS ALGORITHM: RPR Ser Ql: NONREACTIVE

## 2024-07-24 LAB — CBC
HCT: 47.6 % (ref 39.0–52.0)
Hemoglobin: 15.2 g/dL (ref 13.0–17.0)
MCH: 27.5 pg (ref 26.0–34.0)
MCHC: 31.9 g/dL (ref 30.0–36.0)
MCV: 86.2 fL (ref 80.0–100.0)
Platelets: 265 K/uL (ref 150–400)
RBC: 5.52 MIL/uL (ref 4.22–5.81)
RDW: 14.9 % (ref 11.5–15.5)
WBC: 13.1 K/uL — ABNORMAL HIGH (ref 4.0–10.5)
nRBC: 0.2 % (ref 0.0–0.2)

## 2024-07-24 LAB — BASIC METABOLIC PANEL WITH GFR
Anion gap: 14 (ref 5–15)
BUN: 41 mg/dL — ABNORMAL HIGH (ref 8–23)
CO2: 23 mmol/L (ref 22–32)
Calcium: 8.9 mg/dL (ref 8.9–10.3)
Chloride: 112 mmol/L — ABNORMAL HIGH (ref 98–111)
Creatinine, Ser: 0.94 mg/dL (ref 0.61–1.24)
GFR, Estimated: >60 mL/min (ref >60)
Glucose, Bld: 166 mg/dL — ABNORMAL HIGH (ref 70–99)
Potassium: 3.8 mmol/L (ref 3.5–5.1)
Sodium: 149 mmol/L — ABNORMAL HIGH (ref 135–145)

## 2024-07-24 MED ORDER — FREE WATER
300.0000 mL | Freq: Four times a day (QID) | Status: DC
  Administered 2024-07-24 – 2024-07-25 (×4): 300 mL

## 2024-07-24 MED ORDER — POTASSIUM CHLORIDE 20 MEQ PO PACK
40.0000 meq | PACK | Freq: Once | ORAL | Status: AC
  Administered 2024-07-24: 40 meq
  Filled 2024-07-24: qty 2

## 2024-07-24 MED ORDER — FUROSEMIDE 10 MG/ML IJ SOLN
40.0000 mg | Freq: Three times a day (TID) | INTRAMUSCULAR | Status: AC
Start: 2024-07-24 — End: 2024-07-24
  Administered 2024-07-24 (×2): 40 mg via INTRAVENOUS
  Filled 2024-07-24 (×2): qty 4

## 2024-07-24 NOTE — Plan of Care (Signed)
  Problem: Education: Goal: Knowledge of disease or condition will improve Outcome: Progressing Goal: Knowledge of secondary prevention will improve (MUST DOCUMENT ALL) Outcome: Progressing Goal: Knowledge of patient specific risk factors will improve (DELETE if not current risk factor) Outcome: Progressing   Problem: Intracerebral Hemorrhage Tissue Perfusion: Goal: Complications of Intracerebral Hemorrhage will be minimized Outcome: Progressing   Problem: Coping: Goal: Will verbalize positive feelings about self Outcome: Progressing Goal: Will identify appropriate support needs Outcome: Progressing   Problem: Self-Care: Goal: Ability to participate in self-care as condition permits will improve Outcome: Progressing Goal: Verbalization of feelings and concerns over difficulty with self-care will improve Outcome: Progressing Goal: Ability to communicate needs accurately will improve Outcome: Progressing   Problem: Health Behavior/Discharge Planning: Goal: Ability to manage health-related needs will improve Outcome: Progressing   Problem: Clinical Measurements: Goal: Ability to maintain clinical measurements within normal limits will improve Outcome: Progressing Goal: Will remain free from infection Outcome: Progressing Goal: Diagnostic test results will improve Outcome: Progressing Goal: Respiratory complications will improve Outcome: Progressing Goal: Cardiovascular complication will be avoided Outcome: Progressing   Problem: Activity: Goal: Risk for activity intolerance will decrease Outcome: Progressing   Problem: Nutrition: Goal: Adequate nutrition will be maintained Outcome: Progressing   Problem: Coping: Goal: Level of anxiety will decrease Outcome: Progressing   Problem: Pain Managment: Goal: General experience of comfort will improve and/or be controlled Outcome: Progressing   Problem: Safety: Goal: Ability to remain free from injury will  improve Outcome: Progressing   Problem: Skin Integrity: Goal: Risk for impaired skin integrity will decrease Outcome: Progressing   Problem: Fluid Volume: Goal: Hemodynamic stability will improve Outcome: Progressing   Problem: Respiratory: Goal: Ability to maintain adequate ventilation will improve Outcome: Progressing   Problem: Coping: Goal: Ability to adjust to condition or change in health will improve Outcome: Progressing   Problem: Fluid Volume: Goal: Ability to maintain a balanced intake and output will improve Outcome: Progressing   Problem: Skin Integrity: Goal: Risk for impaired skin integrity will decrease Outcome: Progressing   Problem: Tissue Perfusion: Goal: Adequacy of tissue perfusion will improve Outcome: Progressing

## 2024-07-24 NOTE — Progress Notes (Signed)
 OT Cancellation Note  Patient Details Name: Lawrence Barnett MRN: [id number] DOB: Jun 17, 1951   Cancelled Treatment:    Reason Eval/Treat Not Completed: Medical issues which prohibited therapy (Pt has been on medical hold for > 3 days. Remains intubated, RASS score -3. Will d/c OT orders, please re-consult when appropriate.)   [name] [name], OTR/L [facility] [phone] Secure Chat Preferred  [name] 07/24/2024, 7:34 AM

## 2024-07-24 NOTE — Progress Notes (Signed)
 STROKE TEAM PROGRESS NOTE    SIGNIFICANT HOSPITAL EVENTS 11/11: Patient admitted with right basal ganglia ICH with IVH 11/13: Patient transferred out of the ICU  INTERIM HISTORY/SUBJECTIVE Patient remains critically ill.  Off sedation with very poorly responsive.  Does not open eyes.  Globally aphasic.  Not following commands.  Trace movement of extremities to painful stimuli.  EEG shows moderate diffuse slowing no definite seizure activity.  Antibiotics have been changed now to doxycycline .  Sodium slightly elevated and free water  increased Prognosis is guarded.  Family still wants full support patient may end up needing trach and PEG tube. OBJECTIVE  CBC    Component Value Date/Time   WBC 13.1 (H) 07/24/2024 0318   RBC 5.52 07/24/2024 0318   HGB 14.3 07/24/2024 1217   HGB 16.2 10/07/2012 1618   HCT 42.0 07/24/2024 1217   HCT 47.8 10/07/2012 1618   PLT 265 07/24/2024 0318   PLT 306 10/07/2012 1618   MCV 86.2 07/24/2024 0318   MCV 92 10/07/2012 1618   MCH 27.5 07/24/2024 0318   MCHC 31.9 07/24/2024 0318   RDW 14.9 07/24/2024 0318   RDW 13.9 10/07/2012 1618   LYMPHSABS 2.1 07/14/2024 1904   MONOABS 0.4 07/14/2024 1904   EOSABS 0.1 07/14/2024 1904   BASOSABS 0.0 07/14/2024 1904    BMET    Component Value Date/Time   NA 149 (H) 07/24/2024 1217   NA 139 10/08/2012 0431   K 4.8 07/24/2024 1217   K 4.0 10/08/2012 0431   CL 112 (H) 07/24/2024 0318   CL 107 10/08/2012 0431   CO2 23 07/24/2024 0318   CO2 25 10/08/2012 0431   GLUCOSE 166 (H) 07/24/2024 0318   GLUCOSE 133 (H) 10/08/2012 0431   BUN 41 (H) 07/24/2024 0318   BUN 10 10/08/2012 0431   CREATININE 0.94 07/24/2024 0318   CREATININE 0.83 10/08/2012 0431   CALCIUM 8.9 07/24/2024 0318   CALCIUM 9.4 10/08/2012 0431   GFRNONAA >60 07/24/2024 0318   GFRNONAA >60 10/08/2012 0431    IMAGING past 24 hours EEG adult Result Date: 07/23/2024 Gregg Lek, MD     07/23/2024  7:36 PM Patient Name: Lawrence Barnett MRN:  [id number] Epilepsy Attending: Lek Gregg Referring Physician/Provider: No ref. provider found     Date: 07/23/2024 Duration: 23 minutes Patient history: 73 year old man with basal ganglia stroke. Remains encephalopathic. EEG to rule out seizure Level of alertness: Intubated and sedated AEDs during EEG study: None Technical aspects: This EEG study was done with scalp electrodes positioned according to the 10-20 International system of electrode placement. Electrical activity was reviewed with band pass filter of 1-70Hz , sensitivity of 7 uV/mm, display speed of 33mm/sec with a 60Hz  notched filter applied as appropriate. EEG data were recorded continuously and digitally stored.  Video monitoring was available and reviewed as appropriate. Description: EEG showed continuous generalized polymorphic sharply contoured 3 to 6 Hz theta-delta slowing. Hyperventilation and photic stimulation were not performed.   ABNORMALITY - Continuous slow, generalized IMPRESSION: This study is suggestive of moderate to severe diffuse encephalopathy, nonspecific etiology but likely related to sedation, toxic-metabolic etiology, hypoxic brain injury.  No seizures or epileptiform discharges were seen throughout the recording. Lek Gregg MD Neurology      Vitals:   07/24/24 1126 07/24/24 1142 07/24/24 1200 07/24/24 1300  BP:   137/87 128/77  Pulse:   78 73  Resp:   (!) 21 (!) 21  Temp:  (!) 96.8 F (36 C)  TempSrc:  Axillary    SpO2: 100%  100% 100%  Weight:      Height:         PHYSICAL EXAM General:   , well-nourished, well-developed elderly patient in no acute distress.  Intubated Psych:  Mood and affect appropriate for situation CV: Regular rate and rhythm on monitor Respiratory:  Regular, unlabored respirations on room air GI: Abdomen soft and nontender   NEURO:  Mental Status: Patient is intubated.  Not on any sedation.  Eyes closed.  Unresponsive  can be barely aroused.  Not following commands.   Speech/Language: speech cannot be ascertained.  Cranial Nerves:  II: PERRL. Visual fields full.  III, IV, VI: Right gaze preference,   V: Sensation is intact to light touch and symmetrical to face.  VII: Left facial droop VIII: hearing intact to voice. IX, X: Voice is dysarthric XII: tongue is midline without fasciculations. Motor: Able to move right upper and lower extremities with painful stimulus, no movement of left upper extremity but is able to flicker left lower extremity to noxious Tone: is normal and bulk is normal Sensation-sensation diminished on the left Coordination: Unable to test gait- deferred      ASSESSMENT/PLAN  Lawrence Barnett is a 73 y.o. male with history of hypertension admitted for left-sided hemiplegia.  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  He did not pass his swallow study and will have cortrak inserted NIH on Admission 17  ICH:  right ganglia/thalamus ICH with IVH, etiology: Likely hypertensive Acute ischemic infarcts: numerous new punctate and small acute to early subacute infarcts, watershed territory; etiology likely synchronous bilateral watershed small vessel disease infarcts related to hypotension   from sepsis There may also be a component of encephalopathy related to cefepime  and thyroid  storm,,sepsis Code Stroke CT head 7 mm acute IPH centered at right thalamus with IVH, no hydrocephalus CTA head & neck no LVO or hemodynamically significant stenosis, no vascular abnormality underlying right thalamic hemorrhage  Follow-up head CT slight interval increase in size of right thalamic hemorrhage, slightly increased IVH MRI acute right thalamic IPH with IVH, stable, no evidence of underlying mass Repeat MRI - numerous new punctate and small acute to early subacute infarcts predominantly involving the bilateral centrum semiovale and deep white matter, with additional involvement of the bilateral basal ganglia, cerebral  lobes, right cerebellum and right insula consistent with multifocal acute ischemia. 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  2D Echo EF 55-60% EEG LDL 73 HgbA1c 5.6 UDS neg VTE prophylaxis - heparin  subq No antithrombotic prior to admission, now on No antithrombotic secondary to IPH Therapy recommendations:  CIR Disposition: Pending  Hypertension Home meds: None BP stable Now stable on the high end On losartan  100, hydralazine  50mg  TID BP goal < 160 Long-term BP goal normotensive  Lipid management Home meds: None LDL 73, goal < 70 Add statin at discharge  Fever with tachycardia ?  Aspiration Afebrile for 24 hours Concern for aspiration CXR negative Keep NPO and close monitoring On  antibiotics Switch cefepime  to doxycycline    AKI Hypernatremia Na 145--148--152 Cre 0.84-0.99-1.35-1.08 Close monitoring  Dysphagia Patient has post-stroke dysphagia, SLP consulted  Was on soft diet with nectar thick liquids, now n.p.o. due to concern of aspiration Continue IVF   Other Stroke Risk Factors Advanced age   Hospital day # 10     Patient neurological exam appears to have declined significantly over the past several days  in the setting of sepsis hypotension and antibiotic usage.  MRI scan shows multiple tiny punctate bilateral mostly subcortical watershed distribution infarcts likely from small vessel disease and hypotension from sepsis.  His mental status seems to be out of proportion to the extent of infarcts and is likely multifactorial related to underlying encephalopathy from sepsis as well as possibly antibiotic usage of cefepime .  EEG shows moderate diffuse encephalopathy without seizures.  His antibiotics have been changed now to doxycycline .  No family available at the bedside for discussion.  Discussed with Dr. Delmon.  Prognosis is guarded.  Stroke team will sign off.  Kindly call for questions This patient  is critically ill and at significant risk of neurological worsening, death and care requires constant monitoring of vital signs, hemodynamics,respiratory and cardiac monitoring, extensive review of multiple databases, frequent neurological assessment, discussion with family, other specialists and medical decision making of high complexity.I have made any additions or clarifications directly to the above note.This critical care time does not reflect procedure time, or teaching time or supervisory time of PA/NP/Med Resident etc but could involve care discussion time.  I spent 30 minutes of neurocritical care time  in the care of  this patient.        Eather Popp, MD Medical Director [facility] Pager: [phone] 07/24/2024 2:41 PM   To contact Stroke Continuity provider, please refer to [email or web address]. After hours, contact General Neurology

## 2024-07-24 NOTE — Progress Notes (Addendum)
 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 Pt was progressing thru his hospitalization with a superimposed infection 2/2 suspected aspiration pneumonia being empirically treated.    Pt's mental status declined yesterday evening prompting overnight evaluation via RRT. Pt was reportedly found obtunded, tachycardic and tachypneic. VSS however. Abx were broadened, pt was given fluids, and stat cth revealing mildly increased ventricular size concerning for mildly progressive hydrocephalus. Neuro was reached out to who recommended repeat cth to eval progression further at 0800.    Pt's labs at 0200 this am revealed multiple areas for improvement that could contribute to pt's mental status as well. Thru the course of the day however it appears that pt's mental status did not improve, bicarb/renal function/ammonia/lactate/sodium have been worsening as well. Cth has yet to be complete.    Ccm was consulted 11/16 PM as pt remains obtunded and req ICU transfer.  Intubated, on peripheral pressors, multiple abnormal labs  Pertinent  Medical History  Htn  Significant Hospital Events: Including procedures, antibiotic start and stop dates in addition to other pertinent events   11/11 admitted to ICU  11/13 transferred to floor  11/16 CCM consulted and transferred back to ICU 11/16, intubated, on peripheral pressors 11/17-did tolerate, mental status data remain poor 11/18-tolerated weaning pressor support Tolerating weaning, remains encephalopathic11/21  Antibiotics Cefepime  11/15-20 Doxycycline  11/20>> Zyvox  11/16 Vancomycin  11/15. Metronidazole  11/15-11/16. Ceftriaxone  11/15.  Interim History / Subjective:    Remains off sedation MRI results noted Updated niece at bedside No significant overnight events  Objective    Blood pressure 119/81, pulse 85, temperature 97.8 F (36.6 C), temperature source Axillary, resp. rate (!) 25, height 5' 10 (1.778 m), weight 63.2 kg, SpO2 100%.    Vent Mode: PSV;CPAP FiO2 (%):  [40 %] 40 % Set Rate:  [14 bmp] 14 bmp Vt Set:  [440 mL] 440 mL PEEP:  [5 cmH20] 5 cmH20 Pressure Support:  [8 cmH20] 8 cmH20 Plateau Pressure:  [16 cmH20-18 cmH20] 18 cmH20   Intake/Output Summary (Last 24 hours) at 07/24/2024 1124 Last data filed at 07/24/2024 0630 Gross per 24 hour  Intake 2003.04 ml  Output 3700 ml  Net -1696.96 ml   Filed Weights   07/22/24 0500 07/23/24 0500 07/24/24 0423  Weight: 59.4 kg 64.2 kg 63.2 kg    Examination: General: Elderly, frail HENT: Dry oral mucosa, endotracheal tube in place Lungs: Clear breath sounds bilaterally Cardiovascular: S1-S2 appreciated Abdomen: Bowel sounds appreciated Extremities: No clubbing, no edema Neuro: Pupils reactive No response to painful stimuli  6 L positive fluid balance  I reviewed last 24 h vitals and pain scores, last 48 h intake and output, last 24 h labs and trends, and last 24 h imaging results. 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  Continue tube feeds  Maxillary sinusitis on MRI - Continue Doxy  Hypertension - Better controlled  Updated niece at bedside I did start discussing medium term program plans which may mean you will need a tracheostomy and a PEG, will take it a day at a time  Will check ABG today Labs   CBC: Recent Labs  Lab 07/20/24 0859 07/21/24 0510 07/21/24 0847 07/22/24 0506 07/23/24 0332 07/24/24 0318  WBC 16.8* 15.6*  --  15.3* 12.8* 13.1*  HGB 17.2* 16.2 14.6 15.2 14.6 15.2  HCT 52.9* 49.8 43.0 47.3 46.9 47.6  MCV 83.6 85.0  --  85.5 87.3 86.2  PLT 272 205  --  218 182 265    Basic Metabolic Panel: Recent Labs  Lab 07/19/24 1458 07/19/24 2321 07/20/24 0216 07/20/24 0445 07/20/24 0859 07/21/24 0510 07/21/24 0847 07/22/24 0506 07/23/24 0332 07/24/24 0318  NA  --    < > 154*   < > 155* 151* 150* 151* 144 149*  K  --    < > 3.2*   < > 3.3* 3.5 3.9 4.0 4.5 3.8  CL  --    < > 114*  --  114* 111  --  113* 110 112*  CO2  --    < > 24  --  24 25  --  24 23 23   GLUCOSE  --    < > 93  --  140* 176*  --  202* 222* 166*  BUN  --    < > 41*  --  50* 59*  --  56* 45* 41*  CREATININE  --    < > 1.67*  --  2.38* 1.89*  --  1.42* 1.08 0.94  CALCIUM  --    < > 9.4  --  9.2 8.9  --  9.0 8.6* 8.9  MG 2.1  --  2.2  --   --   --   --   --   --   --   PHOS 2.7  --  3.6  --   --   --   --   --   --   --    < > = values in this interval not displayed.   GFR: Estimated Creatinine Clearance: 60.7 mL/min (by C-G formula based on SCr of 0.94 mg/dL). Recent Labs  Lab 07/18/24 2051 07/19/24 0255 07/19/24 2222 07/19/24 2252 07/20/24 0859 07/20/24 1050 07/20/24 1834 07/21/24 0510 07/22/24 0506 07/23/24 0332 07/24/24 0318  PROCALCITON 3.34  --   --   --   --   --   --   --   --   --   --   WBC 11.8*   < >  --    < > 16.8*  --   --  15.6* 15.3* 12.8* 13.1*  LATICACIDVEN 3.9*   < > 6.6*  --  3.1* 2.4* 1.5  --   --   --   --    < > = values in this interval not displayed.     Liver Function Tests: Recent Labs  Lab 07/18/24 2051 07/19/24 2321 07/20/24 0216 07/22/24 0506  AST 87* 107* 122* 59*  ALT 39 41 45* 34  ALKPHOS 95 75 78 85  BILITOT 3.3* 2.7* 2.7* 0.8  PROT 8.5* 6.8 7.8 7.2  ALBUMIN 3.0* 2.1* 2.5* 2.0*  No results for input(s): LIPASE, AMYLASE in the last 168 hours. Recent Labs  Lab 07/19/24 0255 07/22/24 0506  AMMONIA 59* 23    ABG    Component Value Date/Time   PHART 7.491 (H) 07/21/2024 0847   PCO2ART 34.3 07/21/2024 0847   PO2ART 63 (L) 07/21/2024 0847   HCO3 26.2 07/21/2024 0847   TCO2 27 07/21/2024 0847   ACIDBASEDEF 0.8 07/18/2024 2250   O2SAT 94 07/21/2024 0847     CBG: Recent Labs  Lab 07/23/24 1547 07/23/24 1930 07/23/24 2333 07/24/24 0327 07/24/24 0744  GLUCAP 263* 206* 136* 127* 209*    Review of Systems:   Unable to provide any history  Past Medical History:  He,  has a past medical history of Hypertension.   Surgical History:  No past surgical history on file.   Social History:   reports that he has quit smoking. He has never used smokeless tobacco. He reports current alcohol use of about 1.0 standard drink of alcohol per week. He reports that he does not use drugs.   Family History:  His family history is not on file.   Allergies Allergies  Allergen Reactions   Clonidine  Other (See Comments)    Weakness, lower extremity paralysis feeling   Penicillins Shortness Of Breath    Patient reports reaction occurred as a teenager and he experienced shortness of breath not requiring hospitalization.    Amlodipine  Swelling   Lisinopril Swelling   Metoprolol Swelling   Tylenol  [Acetaminophen ]     The patient is critically ill with multiple organ systems failure and requires high complexity decision making for assessment and support, frequent evaluation and titration of therapies, application of advanced monitoring technologies and extensive interpretation of multiple databases. Critical Care Time  devoted to patient care services described in this note independent of APP/resident time (if applicable)  is 33 minutes.   Jennet Epley MD [facility] Personal pager: See Amion If unanswered, please page CCM On-call: [phone]

## 2024-07-25 DIAGNOSIS — E876 Hypokalemia: Secondary | ICD-10-CM | POA: Diagnosis not present

## 2024-07-25 DIAGNOSIS — G919 Hydrocephalus, unspecified: Secondary | ICD-10-CM | POA: Diagnosis not present

## 2024-07-25 DIAGNOSIS — I610 Nontraumatic intracerebral hemorrhage in hemisphere, subcortical: Secondary | ICD-10-CM | POA: Diagnosis not present

## 2024-07-25 DIAGNOSIS — E870 Hyperosmolality and hypernatremia: Secondary | ICD-10-CM | POA: Diagnosis not present

## 2024-07-25 LAB — CBC
HCT: 47.8 % (ref 39.0–52.0)
Hemoglobin: 15 g/dL (ref 13.0–17.0)
MCH: 27.2 pg (ref 26.0–34.0)
MCHC: 31.4 g/dL (ref 30.0–36.0)
MCV: 86.8 fL (ref 80.0–100.0)
Platelets: 264 K/uL (ref 150–400)
RBC: 5.51 MIL/uL (ref 4.22–5.81)
RDW: 15.1 % (ref 11.5–15.5)
WBC: 14.9 K/uL — ABNORMAL HIGH (ref 4.0–10.5)
nRBC: 0 % (ref 0.0–0.2)

## 2024-07-25 LAB — BASIC METABOLIC PANEL WITH GFR
Anion gap: 12 (ref 5–15)
BUN: 42 mg/dL — ABNORMAL HIGH (ref 8–23)
CO2: 24 mmol/L (ref 22–32)
Calcium: 8.9 mg/dL (ref 8.9–10.3)
Chloride: 114 mmol/L — ABNORMAL HIGH (ref 98–111)
Creatinine, Ser: 1.03 mg/dL (ref 0.61–1.24)
GFR, Estimated: >60 mL/min (ref >60)
Glucose, Bld: 238 mg/dL — ABNORMAL HIGH (ref 70–99)
Potassium: 3.9 mmol/L (ref 3.5–5.1)
Sodium: 150 mmol/L — ABNORMAL HIGH (ref 135–145)

## 2024-07-25 LAB — GLUCOSE, CAPILLARY
Glucose-Capillary: 231 mg/dL — ABNORMAL HIGH (ref 70–99)
Glucose-Capillary: 262 mg/dL — ABNORMAL HIGH (ref 70–99)
Glucose-Capillary: 236 mg/dL — ABNORMAL HIGH (ref 70–99)
Glucose-Capillary: 270 mg/dL — ABNORMAL HIGH (ref 70–99)
Glucose-Capillary: 220 mg/dL — ABNORMAL HIGH (ref 70–99)
Glucose-Capillary: 239 mg/dL — ABNORMAL HIGH (ref 70–99)

## 2024-07-25 LAB — MAGNESIUM: Magnesium: 1.9 mg/dL (ref 1.7–2.4)

## 2024-07-25 LAB — TSH: TSH: <0.1 u[IU]/mL — ABNORMAL LOW (ref 0.350–4.500)

## 2024-07-25 LAB — T4, FREE: Free T4: 0.94 ng/dL (ref 0.61–1.12)

## 2024-07-25 MED ORDER — FREE WATER
300.0000 mL | Status: DC
  Administered 2024-07-25 – 2024-07-26 (×7): 300 mL

## 2024-07-25 MED ORDER — FUROSEMIDE 10 MG/ML IJ SOLN
40.0000 mg | Freq: Every day | INTRAMUSCULAR | Status: DC
  Administered 2024-07-25: 40 mg via INTRAVENOUS
  Filled 2024-07-25: qty 4

## 2024-07-25 NOTE — Progress Notes (Signed)
 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 Pt was progressing thru his hospitalization with a superimposed infection 2/2 suspected aspiration pneumonia being empirically treated.    Pt's mental status declined yesterday evening prompting overnight evaluation via RRT. Pt was reportedly found obtunded, tachycardic and tachypneic. VSS however. Abx were broadened, pt was given fluids, and stat cth revealing mildly increased ventricular size concerning for mildly progressive hydrocephalus. Neuro was reached out to who recommended repeat cth to eval progression further at 0800.    Pt's labs at 0200 this am revealed multiple areas for improvement that could contribute to pt's mental status as well. Thru the course of the day however it appears that pt's mental status did not improve, bicarb/renal function/ammonia/lactate/sodium have been worsening as well. Cth has yet to be complete.    Ccm was consulted 11/16 PM as pt remains obtunded and req ICU transfer.  Intubated, on peripheral pressors, multiple abnormal labs  Pertinent  Medical History  Htn  Significant Hospital Events: Including procedures, antibiotic start and stop dates in addition to other pertinent events   11/11 admitted to ICU  11/13 transferred to floor  11/16 CCM consulted and transferred back to ICU 11/16, intubated, on peripheral pressors 11/17-did tolerate, mental status data remain poor 11/18-tolerated weaning pressor support Tolerating weaning, remains encephalopathic11/21 11/22 remains encephalopathic, on full vent support  Antibiotics Cefepime  11/15-20 Doxycycline  11/20>> Zyvox  11/16 Vancomycin  11/15. Metronidazole   11/15-11/16. Ceftriaxone  11/15.  Interim History / Subjective:   Remains off sedation No significant overnight events Intermittently was able to squeeze my hand on the right side  MRI results noted  Objective    Blood pressure 139/74, pulse 83, temperature 97.9 F (36.6 C), temperature source Oral, resp. rate 19, height 5' 10 (1.778 m), weight 62.2 kg, SpO2 100%.    Vent Mode: PRVC FiO2 (%):  [40 %] 40 % Set Rate:  [14 bmp] 14 bmp Vt Set:  [440 mL] 440 mL PEEP:  [5 cmH20] 5 cmH20 Pressure Support:  [8 cmH20] 8 cmH20 Plateau Pressure:  [11 cmH20-18 cmH20] 11 cmH20   Intake/Output Summary (Last 24 hours) at 07/25/2024 0843 Last data filed at 07/25/2024 0600 Gross per 24 hour  Intake 3133.73 ml  Output 3570 ml  Net -436.27 ml   Filed Weights   07/23/24 0500 07/24/24 0423 07/25/24 0209  Weight: 64.2 kg 63.2 kg 62.2 kg    Examination: General: Elderly, frail HENT: Dry oral mucosa, endotracheal tube in place Lungs: Clear breath sounds bilaterally Cardiovascular: S1-S2 appreciated Abdomen: Bowel sounds appreciated Extremities: No clubbing, no edema Neuro: Pupils are reactive No response to painful stimuli  7 L positive fluid balance  I reviewed last 24 h vitals and pain scores, last 48 h intake and output, last 24 h labs and trends, and last 24 h imaging results.  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 Patient remains encephalopathic - Neuroprotective measures- normothermia, euglycemia, HOB greater than 30, head in neutral alignment, normocapnia, normoxia - Neurology continues to follow  Hypernatremia - Improving - Increase free water  -Lasix  as needed hopefully for some natriuresis  Acute kidney injury - Remains stable - Continue to monitor  Hyperthyroidism - On propranolol , methimazole , steroids - T4 is better - TSH  takes a while to improve  Continue tube feeds  Maxillary sinusitis - Continue doxycycline   Hypertension is better controlled  Updated niece at bedside 07/24/2024 I did start discussing medium term program plans which may mean you will need a tracheostomy and a PEG, will take it a day at a time  Labs   CBC: Recent Labs  Lab 07/21/24 0510 07/21/24 0847 07/22/24 0506 07/23/24 0332 07/24/24 0318 07/24/24 1217 07/25/24 0143  WBC 15.6*  --  15.3* 12.8* 13.1*  --  14.9*  HGB 16.2   < > 15.2 14.6 15.2 14.3 15.0  HCT 49.8   < > 47.3 46.9 47.6 42.0 47.8  MCV 85.0  --  85.5 87.3 86.2  --  86.8  PLT 205  --  218 182 265  --  264   < > = values in this interval not displayed.    Basic Metabolic Panel: Recent Labs  Lab 07/19/24 1458 07/19/24 2321 07/20/24 0216 07/20/24 0445 07/21/24 0510 07/21/24 0847 07/22/24 0506 07/23/24 0332 07/24/24 0318 07/24/24 1217 07/25/24 0143  NA  --    < > 154*   < > 151*   < > 151* 144 149* 149* 150*  K  --    < > 3.2*   < > 3.5   < > 4.0 4.5 3.8 4.8 3.9  CL  --    < > 114*   < > 111  --  113* 110 112*  --  114*  CO2  --    < > 24   < > 25  --  24 23 23   --  24  GLUCOSE  --    < > 93   < > 176*  --  202* 222* 166*  --  238*  BUN  --    < > 41*   < > 59*  --  56* 45* 41*  --  42*  CREATININE  --    < > 1.67*   < > 1.89*  --  1.42* 1.08 0.94  --  1.03  CALCIUM  --    < > 9.4   < > 8.9  --  9.0 8.6* 8.9  --  8.9  MG 2.1  --  2.2  --   --   --   --   --   --   --  1.9  PHOS 2.7  --  3.6  --   --   --   --   --   --   --   --    < > = values in this interval not displayed.   GFR: Estimated Creatinine Clearance: 54.5 mL/min (by C-G formula based on SCr of 1.03 mg/dL). Recent Labs  Lab 07/18/24 2051 07/19/24 0255 07/19/24 2222 07/19/24 2252 07/20/24 0859 07/20/24 1050 07/20/24 1834 07/21/24 0510 07/22/24 0506 07/23/24 0332 07/24/24 0318 07/25/24 0143  PROCALCITON 3.34  --   --   --   --   --   --   --   --   --   --   --   WBC 11.8*    < >  --    < > 16.8*  --   --    < > 15.3* 12.8* 13.1* 14.9*  LATICACIDVEN 3.9*   < > 6.6*  --  3.1* 2.4* 1.5  --   --   --   --   --    < > =  values in this interval not displayed.    Liver Function Tests: Recent Labs  Lab 07/18/24 2051 07/19/24 2321 07/20/24 0216 07/22/24 0506  AST 87* 107* 122* 59*  ALT 39 41 45* 34  ALKPHOS 95 75 78 85  BILITOT 3.3* 2.7* 2.7* 0.8  PROT 8.5* 6.8 7.8 7.2  ALBUMIN 3.0* 2.1* 2.5* 2.0*   No results for input(s): LIPASE, AMYLASE in the last 168 hours. Recent Labs  Lab 07/19/24 0255 07/22/24 0506  AMMONIA 59* 23    ABG    Component Value Date/Time   PHART 7.507 (H) 07/24/2024 1217   PCO2ART 36.3 07/24/2024 1217   PO2ART 180 (H) 07/24/2024 1217   HCO3 28.8 (H) 07/24/2024 1217   TCO2 30 07/24/2024 1217   ACIDBASEDEF 0.8 07/18/2024 2250   O2SAT 100 07/24/2024 1217     CBG: Recent Labs  Lab 07/24/24 1516 07/24/24 1924 07/24/24 2343 07/25/24 0310 07/25/24 0725  GLUCAP 255* 241* 242* 231* 262*    Review of Systems:   Unable to provide any history  Past Medical History:  He,  has a past medical history of Hypertension.   Surgical History:  No past surgical history on file.   Social History:   reports that he has quit smoking. He has never used smokeless tobacco. He reports current alcohol use of about 1.0 standard drink of alcohol per week. He reports that he does not use drugs.   Family History:  His family history is not on file.   Allergies Allergies  Allergen Reactions   Clonidine  Other (See Comments)    Weakness, lower extremity paralysis feeling   Penicillins Shortness Of Breath    Patient reports reaction occurred as a teenager and he experienced shortness of breath not requiring hospitalization.    Amlodipine  Swelling   Lisinopril Swelling   Metoprolol Swelling   Tylenol  [Acetaminophen ]     The patient is critically ill with multiple organ systems failure and requires high complexity decision making for  assessment and support, frequent evaluation and titration of therapies, application of advanced monitoring technologies and extensive interpretation of multiple databases. Critical Care Time devoted to patient care services described in this note independent of APP/resident time (if applicable)  is 31 minutes.   Jennet Epley MD [facility] Personal pager: See Amion If unanswered, please page CCM On-call: #[phone]

## 2024-07-25 NOTE — Plan of Care (Signed)
  Problem: Nutrition: Goal: Risk of aspiration will decrease Outcome: Progressing Goal: Dietary intake will improve Outcome: Progressing   Problem: Clinical Measurements: Goal: Respiratory complications will improve Outcome: Progressing Goal: Cardiovascular complication will be avoided Outcome: Progressing

## 2024-07-26 DIAGNOSIS — E876 Hypokalemia: Secondary | ICD-10-CM | POA: Diagnosis not present

## 2024-07-26 DIAGNOSIS — I610 Nontraumatic intracerebral hemorrhage in hemisphere, subcortical: Secondary | ICD-10-CM | POA: Diagnosis not present

## 2024-07-26 DIAGNOSIS — G919 Hydrocephalus, unspecified: Secondary | ICD-10-CM | POA: Diagnosis not present

## 2024-07-26 DIAGNOSIS — E870 Hyperosmolality and hypernatremia: Secondary | ICD-10-CM | POA: Diagnosis not present

## 2024-07-26 LAB — BASIC METABOLIC PANEL WITH GFR
Anion gap: 8 (ref 5–15)
BUN: 38 mg/dL — ABNORMAL HIGH (ref 8–23)
CO2: 28 mmol/L (ref 22–32)
Calcium: 8.9 mg/dL (ref 8.9–10.3)
Chloride: 109 mmol/L (ref 98–111)
Creatinine, Ser: 0.88 mg/dL (ref 0.61–1.24)
GFR, Estimated: >60 mL/min (ref >60)
Glucose, Bld: 223 mg/dL — ABNORMAL HIGH (ref 70–99)
Potassium: 3.8 mmol/L (ref 3.5–5.1)
Sodium: 145 mmol/L (ref 135–145)

## 2024-07-26 LAB — GLUCOSE, CAPILLARY
Glucose-Capillary: 195 mg/dL — ABNORMAL HIGH (ref 70–99)
Glucose-Capillary: 222 mg/dL — ABNORMAL HIGH (ref 70–99)
Glucose-Capillary: 224 mg/dL — ABNORMAL HIGH (ref 70–99)
Glucose-Capillary: 249 mg/dL — ABNORMAL HIGH (ref 70–99)
Glucose-Capillary: 255 mg/dL — ABNORMAL HIGH (ref 70–99)
Glucose-Capillary: 272 mg/dL — ABNORMAL HIGH (ref 70–99)

## 2024-07-26 MED ORDER — FUROSEMIDE 10 MG/ML IJ SOLN
40.0000 mg | Freq: Two times a day (BID) | INTRAMUSCULAR | Status: DC
  Administered 2024-07-26 – 2024-07-29 (×6): 40 mg via INTRAVENOUS
  Filled 2024-07-26 (×6): qty 4

## 2024-07-26 MED ORDER — FREE WATER
200.0000 mL | Status: DC
  Administered 2024-07-26 – 2024-07-27 (×6): 200 mL

## 2024-07-26 NOTE — Progress Notes (Signed)
 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 Pt was progressing thru his hospitalization with a superimposed infection 2/2 suspected aspiration pneumonia being empirically treated.    Pt's mental status declined yesterday evening prompting overnight evaluation via RRT. Pt was reportedly found obtunded, tachycardic and tachypneic. VSS however. Abx were broadened, pt was given fluids, and stat cth revealing mildly increased ventricular size concerning for mildly progressive hydrocephalus. Neuro was reached out to who recommended repeat cth to eval progression further at 0800.    Pt's labs at 0200 this am revealed multiple areas for improvement that could contribute to pt's mental status as well. Thru the course of the day however it appears that pt's mental status did not improve, bicarb/renal function/ammonia/lactate/sodium have been worsening as well. Cth has yet to be complete.    Ccm was consulted 11/16 PM as pt remains obtunded and req ICU transfer.  Intubated, on peripheral pressors, multiple abnormal labs  Pertinent  Medical History  Htn  Significant Hospital Events: Including procedures, antibiotic start and stop dates in addition to other pertinent events   11/11 admitted to ICU  11/13 transferred to floor  11/16 CCM consulted and transferred back to ICU 11/16, intubated, on peripheral pressors 11/17-did tolerate, mental status data remain poor 11/18-tolerated weaning pressor support Tolerating weaning, remains encephalopathic11/21 11/22 remains encephalopathic, on full vent support 11/23 encephalopathic  Antibiotics Cefepime  11/15-20 Doxycycline  11/20>> Zyvox  11/16 Vancomycin   11/15. Metronidazole  11/15-11/16. Ceftriaxone  11/15.  Interim History / Subjective:   Has remained off sedation No significant overnight events Not responsive to commands  Objective    Blood pressure 139/72, pulse 71, temperature 98.7 F (37.1 C), temperature source Axillary, resp. rate 17, height 5' 10 (1.778 m), weight 63.1 kg, SpO2 99%.    Vent Mode: PSV;CPAP FiO2 (%):  [30 %-40 %] 30 % Set Rate:  [14 bmp] 14 bmp Vt Set:  [440 mL] 440 mL PEEP:  [5 cmH20] 5 cmH20 Pressure Support:  [5 cmH20-8 cmH20] 5 cmH20 Plateau Pressure:  [11 cmH20-18 cmH20] 18 cmH20   Intake/Output Summary (Last 24 hours) at 07/26/2024 0807 Last data filed at 07/26/2024 0749 Gross per 24 hour  Intake 3642.18 ml  Output 2690 ml  Net 952.18 ml   Filed Weights   07/24/24 0423 07/25/24 0209 07/26/24 0211  Weight: 63.2 kg 62.2 kg 63.1 kg    Examination: General: Elderly, frail HENT: Moist oral mucosa, endotracheal tube in place Lungs: Clear breath sounds bilaterally Cardiovascular:  S1-S2 appreciated Abdomen: Bowel sounds appreciated Extremities: No clubbing, no edema Neuro: Pupils are small, minimally reactive No response to painful stimuli  7 L fluid positive  I reviewed last 24 h vitals and pain scores, last 48 h intake and output, last 24 h labs and trends, and last 24 h imaging results.  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 Patient remains encephalopathic - Neuroprotective measures- normothermia, euglycemia, HOB greater than 30, head in neutral alignment, normocapnia, normoxia -Neurology continues to follow  Hypernatremia - This is improving - Decrease free water  to 200 every 4 -Increase Lasix  to 40 twice a day for  natriuresis  Acute kidney injury - Stabilized  Hyperthyroidism -On methimazole , propranolol , steroids -T4 better TSH will take a  while to improve  Nutrition - Continue tube feeds  Maxillary sinusitis - Continue doxycycline   Hypertension - Controlled  Updated niece at bedside 07/24/2024 I did start discussing medium term program plans which may mean you will need a tracheostomy and a PEG, will take it a day at a time  Labs   CBC: Recent Labs  Lab 07/21/24 0510 07/21/24 0847 07/22/24 0506 07/23/24 0332 07/24/24 0318 07/24/24 1217 07/25/24 0143  WBC 15.6*  --  15.3* 12.8* 13.1*  --  14.9*  HGB 16.2   < > 15.2 14.6 15.2 14.3 15.0  HCT 49.8   < > 47.3 46.9 47.6 42.0 47.8  MCV 85.0  --  85.5 87.3 86.2  --  86.8  PLT 205  --  218 182 265  --  264   < > = values in this interval not displayed.    Basic Metabolic Panel: Recent Labs  Lab 07/19/24 1458 07/19/24 2321 07/20/24 0216 07/20/24 0445 07/22/24 0506 07/23/24 0332 07/24/24 0318 07/24/24 1217 07/25/24 0143 07/26/24 0055  NA  --    < > 154*   < > 151* 144 149* 149* 150* 145  K  --    < > 3.2*   < > 4.0 4.5 3.8 4.8 3.9 3.8  CL  --    < > 114*   < > 113* 110 112*  --  114* 109  CO2  --    < > 24   < > 24 23 23   --  24 28  GLUCOSE  --    < > 93   < > 202* 222* 166*  --  238* 223*  BUN  --    < > 41*   < > 56* 45* 41*  --  42* 38*  CREATININE  --    < > 1.67*   < > 1.42* 1.08 0.94  --  1.03 0.88  CALCIUM  --    < > 9.4   < > 9.0 8.6* 8.9  --  8.9 8.9  MG 2.1  --  2.2  --   --   --   --   --  1.9  --   PHOS 2.7  --  3.6  --   --   --   --   --   --   --    < > = values in this interval not displayed.   GFR: Estimated Creatinine Clearance: 64.7 mL/min (by C-G formula based on SCr of 0.88 mg/dL). Recent Labs  Lab 07/19/24 2222 07/19/24 2252 07/20/24 0859 07/20/24 1050 07/20/24 1834 07/21/24 0510 07/22/24 0506 07/23/24 0332 07/24/24 0318 07/25/24 0143  WBC  --    < > 16.8*  --   --    < > 15.3* 12.8* 13.1* 14.9*  LATICACIDVEN 6.6*  --  3.1* 2.4* 1.5  --   --   --   --   --    < > = values in this interval not displayed.    Liver  Function Tests: Recent Labs  Lab 07/19/24 2321 07/20/24 0216 07/22/24 0506  AST 107* 122* 59*  ALT 41 45* 34  ALKPHOS 75 78 85  BILITOT 2.7* 2.7* 0.8  PROT 6.8 7.8 7.2  ALBUMIN 2.1* 2.5* 2.0*   No results for input(s): LIPASE, AMYLASE in the last 168 hours. Recent Labs  Lab  07/22/24 0506  AMMONIA 23    ABG    Component Value Date/Time   PHART 7.507 (H) 07/24/2024 1217   PCO2ART 36.3 07/24/2024 1217   PO2ART 180 (H) 07/24/2024 1217   HCO3 28.8 (H) 07/24/2024 1217   TCO2 30 07/24/2024 1217   ACIDBASEDEF 0.8 07/18/2024 2250   O2SAT 100 07/24/2024 1217     CBG: Recent Labs  Lab 07/25/24 1508 07/25/24 1917 07/25/24 2308 07/26/24 0312 07/26/24 0709  GLUCAP 270* 220* 239* 249* 255*      The patient is critically ill with multiple organ systems failure and requires high complexity decision making for assessment and support, frequent evaluation and titration of therapies, application of advanced monitoring technologies and extensive interpretation of multiple databases. Critical Care Time devoted to patient care services described in this note independent of APP/resident time (if applicable)  is 32 minutes.   Jennet Epley MD [facility] Personal pager: See Amion If unanswered, please page CCM On-call: #[phone]

## 2024-07-26 NOTE — Plan of Care (Signed)
 Problem: Education: Goal: Knowledge of disease or condition will improve Outcome: Not Progressing Goal: Knowledge of secondary prevention will improve (MUST DOCUMENT ALL) Outcome: Not Progressing Goal: Knowledge of patient specific risk factors will improve (DELETE if not current risk factor) Outcome: Not Progressing   Problem: Intracerebral Hemorrhage Tissue Perfusion: Goal: Complications of Intracerebral Hemorrhage will be minimized Outcome: Not Progressing   Problem: Coping: Goal: Will verbalize positive feelings about self Outcome: Not Progressing Goal: Will identify appropriate support needs Outcome: Not Progressing   Problem: Health Behavior/Discharge Planning: Goal: Ability to manage health-related needs will improve Outcome: Not Progressing Goal: Goals will be collaboratively established with patient/family Outcome: Not Progressing   Problem: Self-Care: Goal: Ability to participate in self-care as condition permits will improve Outcome: Not Progressing Goal: Verbalization of feelings and concerns over difficulty with self-care will improve Outcome: Not Progressing Goal: Ability to communicate needs accurately will improve Outcome: Not Progressing   Problem: Nutrition: Goal: Risk of aspiration will decrease Outcome: Not Progressing Goal: Dietary intake will improve Outcome: Not Progressing   Problem: Education: Goal: Knowledge of General Education information will improve Description: Including pain rating scale, medication(s)/side effects and non-pharmacologic comfort measures Outcome: Not Progressing   Problem: Health Behavior/Discharge Planning: Goal: Ability to manage health-related needs will improve Outcome: Not Progressing   Problem: Clinical Measurements: Goal: Ability to maintain clinical measurements within normal limits will improve Outcome: Not Progressing Goal: Will remain free from infection Outcome: Not Progressing Goal: Diagnostic test  results will improve Outcome: Not Progressing Goal: Respiratory complications will improve Outcome: Not Progressing Goal: Cardiovascular complication will be avoided Outcome: Not Progressing   Problem: Activity: Goal: Risk for activity intolerance will decrease Outcome: Not Progressing   Problem: Nutrition: Goal: Adequate nutrition will be maintained Outcome: Not Progressing   Problem: Coping: Goal: Level of anxiety will decrease Outcome: Not Progressing   Problem: Elimination: Goal: Will not experience complications related to bowel motility Outcome: Not Progressing Goal: Will not experience complications related to urinary retention Outcome: Not Progressing   Problem: Pain Managment: Goal: General experience of comfort will improve and/or be controlled Outcome: Not Progressing   Problem: Safety: Goal: Ability to remain free from injury will improve Outcome: Not Progressing   Problem: Skin Integrity: Goal: Risk for impaired skin integrity will decrease Outcome: Not Progressing   Problem: Fluid Volume: Goal: Hemodynamic stability will improve Outcome: Not Progressing   Problem: Clinical Measurements: Goal: Diagnostic test results will improve Outcome: Not Progressing Goal: Signs and symptoms of infection will decrease Outcome: Not Progressing   Problem: Respiratory: Goal: Ability to maintain adequate ventilation will improve Outcome: Not Progressing   Problem: Education: Goal: Ability to describe self-care measures that may prevent or decrease complications (Diabetes Survival Skills Education) will improve Outcome: Not Progressing Goal: Individualized Educational Video(s) Outcome: Not Progressing   Problem: Coping: Goal: Ability to adjust to condition or change in health will improve Outcome: Not Progressing   Problem: Fluid Volume: Goal: Ability to maintain a balanced intake and output will improve Outcome: Not Progressing   Problem: Health  Behavior/Discharge Planning: Goal: Ability to identify and utilize available resources and services will improve Outcome: Not Progressing Goal: Ability to manage health-related needs will improve Outcome: Not Progressing   Problem: Metabolic: Goal: Ability to maintain appropriate glucose levels will improve Outcome: Not Progressing   Problem: Nutritional: Goal: Maintenance of adequate nutrition will improve Outcome: Not Progressing Goal: Progress toward achieving an optimal weight will improve Outcome: Not Progressing   Problem: Skin Integrity: Goal: Risk for  impaired skin integrity will decrease Outcome: Not Progressing   Problem: Tissue Perfusion: Goal: Adequacy of tissue perfusion will improve Outcome: Not Progressing

## 2024-07-27 LAB — CBC
HCT: 45.8 % (ref 39.0–52.0)
Hemoglobin: 14.9 g/dL (ref 13.0–17.0)
MCH: 27.6 pg (ref 26.0–34.0)
MCHC: 32.5 g/dL (ref 30.0–36.0)
MCV: 85 fL (ref 80.0–100.0)
Platelets: 364 K/uL (ref 150–400)
RBC: 5.39 MIL/uL (ref 4.22–5.81)
RDW: 15 % (ref 11.5–15.5)
WBC: 14.8 K/uL — ABNORMAL HIGH (ref 4.0–10.5)
nRBC: 0 % (ref 0.0–0.2)

## 2024-07-27 LAB — THYROTROPIN RECEPTOR AUTOABS: Thyrotropin Receptor Ab: 5.84 IU/L — ABNORMAL HIGH (ref 0.00–1.75)

## 2024-07-27 LAB — BASIC METABOLIC PANEL WITH GFR
Anion gap: 17 — ABNORMAL HIGH (ref 5–15)
BUN: 34 mg/dL — ABNORMAL HIGH (ref 8–23)
CO2: 25 mmol/L (ref 22–32)
Calcium: 9.1 mg/dL (ref 8.9–10.3)
Chloride: 109 mmol/L (ref 98–111)
Creatinine, Ser: 0.62 mg/dL (ref 0.61–1.24)
GFR, Estimated: >60 mL/min (ref >60)
Glucose, Bld: 269 mg/dL — ABNORMAL HIGH (ref 70–99)
Potassium: 4 mmol/L (ref 3.5–5.1)
Sodium: 151 mmol/L — ABNORMAL HIGH (ref 135–145)

## 2024-07-27 LAB — T4, FREE: Free T4: 0.93 ng/dL (ref 0.61–1.12)

## 2024-07-27 LAB — GLUCOSE, CAPILLARY
Glucose-Capillary: 258 mg/dL — ABNORMAL HIGH (ref 70–99)
Glucose-Capillary: 261 mg/dL — ABNORMAL HIGH (ref 70–99)
Glucose-Capillary: 250 mg/dL — ABNORMAL HIGH (ref 70–99)
Glucose-Capillary: 245 mg/dL — ABNORMAL HIGH (ref 70–99)
Glucose-Capillary: 265 mg/dL — ABNORMAL HIGH (ref 70–99)
Glucose-Capillary: 173 mg/dL — ABNORMAL HIGH (ref 70–99)

## 2024-07-27 LAB — TSH: TSH: <0.1 u[IU]/mL — ABNORMAL LOW (ref 0.350–4.500)

## 2024-07-27 LAB — THYROID STIMULATING IMMUNOGLOBULIN: Thyroid Stimulating Immunoglob: 0.5 IU/L (ref 0.00–0.55)

## 2024-07-27 LAB — MAGNESIUM: Magnesium: 1.8 mg/dL (ref 1.7–2.4)

## 2024-07-27 MED ORDER — INSULIN GLARGINE-YFGN 100 UNIT/ML ~~LOC~~ SOLN
10.0000 [IU] | Freq: Two times a day (BID) | SUBCUTANEOUS | Status: DC
  Administered 2024-07-27 – 2024-07-30 (×7): 10 [IU] via SUBCUTANEOUS
  Filled 2024-07-27 (×8): qty 0.1

## 2024-07-27 MED ORDER — INSULIN ASPART 100 UNIT/ML IJ SOLN
0.0000 [IU] | INTRAMUSCULAR | Status: DC
  Administered 2024-07-27: 11 [IU] via SUBCUTANEOUS
  Administered 2024-07-27: 4 [IU] via SUBCUTANEOUS
  Administered 2024-07-27 (×2): 7 [IU] via SUBCUTANEOUS
  Administered 2024-07-28: 4 [IU] via SUBCUTANEOUS
  Administered 2024-07-28: 7 [IU] via SUBCUTANEOUS
  Administered 2024-07-28: 4 [IU] via SUBCUTANEOUS
  Administered 2024-07-28 (×3): 7 [IU] via SUBCUTANEOUS
  Administered 2024-07-29: 4 [IU] via SUBCUTANEOUS
  Administered 2024-07-29: 3 [IU] via SUBCUTANEOUS
  Filled 2024-07-27 (×4): qty 7
  Filled 2024-07-27 (×4): qty 4
  Filled 2024-07-27: qty 7
  Filled 2024-07-27: qty 11
  Filled 2024-07-27: qty 7
  Filled 2024-07-27: qty 3

## 2024-07-27 MED ORDER — FREE WATER
300.0000 mL | Status: DC
  Administered 2024-07-27 – 2024-08-03 (×38): 300 mL

## 2024-07-27 MED ORDER — MAGNESIUM SULFATE 2 GM/50ML IV SOLN
2.0000 g | Freq: Once | INTRAVENOUS | Status: AC
  Administered 2024-07-27: 2 g via INTRAVENOUS
  Filled 2024-07-27: qty 50

## 2024-07-27 NOTE — Inpatient Diabetes Management (Addendum)
 Inpatient Diabetes Program Recommendations  AACE/ADA: New Consensus Statement on Inpatient Glycemic Control (2015)  Target Ranges:  Prepandial:   less than 140 mg/dL      Peak postprandial:   less than 180 mg/dL (1-2 hours)      Critically ill patients:  140 - 180 mg/dL   Lab Results  Component Value Date   GLUCAP 250 (H) 07/27/2024   HGBA1C 5.6 07/16/2024    Review of Glycemic Control  Latest Reference Range & Units 07/26/24 15:15 07/26/24 19:49 07/26/24 23:32 07/27/24 03:12 07/27/24 08:09 07/27/24 11:37  Glucose-Capillary 70 - 99 mg/dL 804 (H) 727 (H) 777 (H) 258 (H) 261 (H) 250 (H)   Diabetes history: None Outpatient Diabetes medications:  None Current orders for Inpatient glycemic control:  Novolog  0-20 units q 4 hours Semglee  10 units bid Solucortef 100 mg q 8 hours Osmolite 50 ml/hr  Inpatient Diabetes Program Recommendations:         Note basal insulin  (Semglee  10 units bid) added today as well as Novolog  correction increased to resistant (0-20 units).        If CBG's remain>180 mg/dL with current changes, consider reducing Novolog  correction to 0-15 units q 4 hours and add Novolog  tube feed coverage 3 units q 4 hours.    Thanks,  Randall Bullocks, RN, BC-ADM Inpatient Diabetes Coordinator Pager [phone]  (8a-5p)

## 2024-07-27 NOTE — Progress Notes (Signed)
 Nutrition Follow-up  DOCUMENTATION CODES:   Severe malnutrition in context of social or environmental circumstances, Underweight  INTERVENTION:  TF via Cortrak: Osmolite 1.5 at goal rate of 46ml/hr ( per day) Provides 1800 kcal, 75g protein and free water  daily   Free water  flush 300ml q4h ( total daily) Total free water  (TF+FWF)-   NUTRITION DIAGNOSIS:  Severe Malnutrition related to social / environmental circumstances as evidenced by severe fat depletion, severe muscle depletion. 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 Throughout admission, pt became obtunded, tachycardic and tachypneic requiring transfer to ICU and subsequent intubation. PMH significant for HTN.  11/11: admitted with ICH 11/16: transferred back to ICU, intubated; repeat CT head- slight increase in IVH 11/17: Cortrak placed 11/19: MRI brain- Acute ischemic infarcts: numerous new punctate and small acute to early subacute infarcts, watershed territory; etiology likely hypotension    Patient remains intubated on vent support.  Pt discussed in rounds. Remains encephalopathic off sedation. He continues on tube feeds at goal rate via Cortrak. Per RN no report of emesis or other signs of intolerance. Having bowel movements + remains on lactulose .   EEG showed moderate diffuse slowing, no definite seizure activity.   Neurology s/o.   Noted discussions for possible trach/PEG.   Admit weight: 67.4 kg Current weight: 68.1 kg  UOP: x24 hours FMS: x24 hours   Drains/lines: Cortrak FMS  Medications: lasix  40mg   BID, solu-cortef , SSI 0-15 units q4h, lactulose  10g daily, potassium iodide , IV abx  Labs:  Sodium 151 BUN 34 Anion gap 17 CBG's 195-272 x24 hours  Diet Order:   Diet Order             Diet NPO time specified  Diet effective now                   EDUCATION NEEDS:  No education needs have been identified at this time  Skin:  Skin Assessment: Skin Integrity Issues: Skin Integrity Issues:: Other (Comment) Other: PI to bilateral heels  Last BM:  11/24 via FMS  Height:  Ht Readings from Last 1 Encounters:  07/20/24 5' 10 (1.778 m)    Weight:   Wt Readings from Last 1 Encounters:  07/27/24 68.1 kg   BMI:  Body mass index is 21.54 kg/m.  Estimated Nutritional Needs:   Kcal:  1700-1900  Protein:  75-90g  Fluid:  >/=1.7L  Royce Maris, RDN, LDN Clinical Nutrition See AMiON for contact information.

## 2024-07-27 NOTE — Plan of Care (Signed)
  Problem: Education: Goal: Knowledge of disease or condition will improve Outcome: Progressing Goal: Knowledge of secondary prevention will improve (MUST DOCUMENT ALL) Outcome: Progressing Goal: Knowledge of patient specific risk factors will improve (DELETE if not current risk factor) Outcome: Progressing   Problem: Intracerebral Hemorrhage Tissue Perfusion: Goal: Complications of Intracerebral Hemorrhage will be minimized Outcome: Progressing   Problem: Coping: Goal: Will verbalize positive feelings about self Outcome: Progressing Goal: Will identify appropriate support needs Outcome: Progressing   Problem: Health Behavior/Discharge Planning: Goal: Ability to manage health-related needs will improve Outcome: Progressing Goal: Goals will be collaboratively established with patient/family Outcome: Progressing   Problem: Self-Care: Goal: Ability to participate in self-care as condition permits will improve Outcome: Progressing Goal: Verbalization of feelings and concerns over difficulty with self-care will improve Outcome: Progressing Goal: Ability to communicate needs accurately will improve Outcome: Progressing   Problem: Nutrition: Goal: Risk of aspiration will decrease Outcome: Progressing Goal: Dietary intake will improve Outcome: Progressing   Problem: Education: Goal: Knowledge of General Education information will improve Description: Including pain rating scale, medication(s)/side effects and non-pharmacologic comfort measures Outcome: Progressing   Problem: Health Behavior/Discharge Planning: Goal: Ability to manage health-related needs will improve Outcome: Progressing   Problem: Clinical Measurements: Goal: Ability to maintain clinical measurements within normal limits will improve Outcome: Progressing Goal: Will remain free from infection Outcome: Progressing Goal: Diagnostic test results will improve Outcome: Progressing Goal: Respiratory  complications will improve Outcome: Progressing Goal: Cardiovascular complication will be avoided Outcome: Progressing   Problem: Activity: Goal: Risk for activity intolerance will decrease Outcome: Progressing   Problem: Nutrition: Goal: Adequate nutrition will be maintained Outcome: Progressing   Problem: Coping: Goal: Level of anxiety will decrease Outcome: Progressing   Problem: Elimination: Goal: Will not experience complications related to bowel motility Outcome: Progressing Goal: Will not experience complications related to urinary retention Outcome: Progressing   Problem: Pain Managment: Goal: General experience of comfort will improve and/or be controlled Outcome: Progressing   Problem: Safety: Goal: Ability to remain free from injury will improve Outcome: Progressing   Problem: Skin Integrity: Goal: Risk for impaired skin integrity will decrease Outcome: Progressing   Problem: Fluid Volume: Goal: Hemodynamic stability will improve Outcome: Progressing   Problem: Clinical Measurements: Goal: Diagnostic test results will improve Outcome: Progressing Goal: Signs and symptoms of infection will decrease Outcome: Progressing   Problem: Respiratory: Goal: Ability to maintain adequate ventilation will improve Outcome: Progressing   Problem: Education: Goal: Ability to describe self-care measures that may prevent or decrease complications (Diabetes Survival Skills Education) will improve Outcome: Progressing Goal: Individualized Educational Video(s) Outcome: Progressing   Problem: Coping: Goal: Ability to adjust to condition or change in health will improve Outcome: Progressing   Problem: Fluid Volume: Goal: Ability to maintain a balanced intake and output will improve Outcome: Progressing

## 2024-07-27 NOTE — Progress Notes (Signed)
 NAME:  Lawrence Barnett, MRN:  [id number], DOB:  May 22, 1951, LOS: 13 ADMISSION DATE:  07/14/2024, CONSULTATION DATE:  07/19/24 REFERRING MD:  TRH, CHIEF COMPLAINT:  Obtundation   History of Present Illness:  73 yo male admitted 11/11 with L sided weakness and R gaze found to have R basal ganglia ICH with intraventricular extension. Pt was progressing thru his hospitalization with a superimposed infection 2/2 suspected aspiration pneumonia being empirically treated.    Pt's mental status declined yesterday evening prompting overnight evaluation via RRT. Pt was reportedly found obtunded, tachycardic and tachypneic. VSS however. Abx were broadened, pt was given fluids, and stat cth revealing mildly increased ventricular size concerning for mildly progressive hydrocephalus. Neuro was reached out to who recommended repeat cth to eval progression further at 0800.    Pt's labs at 0200 this am revealed multiple areas for improvement that could contribute to pt's mental status as well. Thru the course of the day however it appears that pt's mental status did not improve, bicarb/renal function/ammonia/lactate/sodium have been worsening as well. Cth has yet to be complete.    Ccm was consulted 11/16 PM as pt remains obtunded and req ICU transfer.  Intubated, on peripheral pressors, multiple abnormal labs  Pertinent  Medical History  Htn  Significant Hospital Events: Including procedures, antibiotic start and stop dates in addition to other pertinent events   11/11 admitted to ICU  11/13 transferred to floor  11/16 CCM consulted and transferred back to ICU 11/16, intubated, on peripheral pressors 11/17-did tolerate, mental status data remain poor 11/18-tolerated weaning pressor support Tolerating weaning, remains encephalopathic11/21 11/22 remains encephalopathic, on full vent support 11/23 encephalopathic - Has remained off sedation . No significant overnight events. Not responsive to  commands  Antibiotics Cefepime  11/15-20 Doxycycline  11/20>> Zyvox  11/16 Vancomycin  11/15. Metronidazole  11/15-11/16. Ceftriaxone  11/15.  Interim History / Subjective:    07/27/24 - afebrile . On vent, fio2 30%. On Tube Feed. Niece Randine at bedside. 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 Says patient was never married and no kids. Live in [place] and moved to [place] 5y ago to be near sister . Now sister is in a facility. No overnight issues. They understand trach is an option in their care but reports no decision made  Objective    Blood pressure (!) 141/78, pulse 75, temperature 98.1 F (36.7 C), temperature source Axillary, resp. rate (!) 24, height 5' 10 (1.778 m), weight 68.1 kg, SpO2 99%.    Vent Mode: PSV;CPAP FiO2 (%):  [30 %] 30 % PEEP:  [5 cmH20] 5 cmH20 Pressure Support:  [5 cmH20] 5 cmH20   Intake/Output Summary (Last 24 hours) at 07/27/2024 9161 Last data filed at 07/27/2024 0825 Gross per 24 hour  Intake 3103.19 ml  Output 3360 ml  Net -256.81 ml   Filed Weights   07/25/24 0209 07/26/24 0211 07/27/24 0600  Weight: 62.2 kg 63.1 kg 68.1 kg    Examination: General Appearance:  Looks criticall ill OBESE - No Head:  Normocephalic, without obvious abnormality, atraumatic Eyes:  PERRL - yes, conjunctiva/corneas - muddy     Ears:  Normal external ear canals, both ears Nose:  G tube - YES Throat:  ETT TUBE - YES , OG tube - no Neck:  Supple,  No enlargement/tenderness/nodules Lungs: Clear to auscultation bilaterally, Ventilator   Synchrony - yes, fio2 30% Heart:  S1 and S2 normal, no murmur, CVP - no.  Pressors - no Abdomen:  Soft, no masses,  no organomegaly Genitalia / Rectal:  Not done Extremities:  Extremities- intact and in boots Skin:  ntact in exposed areas . Sacral area - not examined Neurologic:  Sedation - none -> RASS - -4 equivaln . Moves all 4s - no. CAM-ICU - cannot test . 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  11/24 - Patient remains encephalopathic/obtunded  PLAN - Neuroprotective measures- normothermia, euglycemia, HOB greater than 30, head in neutral alignment, normocapnia, normoxia -Neurology continues to follow  Hypernatremia  - 112/4 - Na 151  Plan -  free water  to 200 every 4 -Lasix  to 40 twice a day for natriuresis - adjust based on trend./neuro recommendations  Acute kidney injury - Stabilized  Hyperthyroidism -On methimazole , propranolol , steroids -T4 better TSH will take a while to improve  Nutrition - Continue tube feeds  Maxillary sinusitis - Continue doxycycline   Hypertension - Controlled  Updated niece at bedside 07/24/2024: CCM  start discussing medium term program plans which may mean you will need a tracheostomy and a PEG, will take it a day at a time   11/24 - Niece Randine updated at bedside    ATTESTATION & SIGNATURE   The patient Lawrence Barnett is critically ill with multiple organ systems failure and requires high complexity decision making for assessment and support, frequent evaluation and titration of therapies, application of advanced monitoring technologies and extensive interpretation of multiple databases and discussion with other appropriate health care personnel such as bedside nurses, social workers, case production designer, theatre/television/film, consultants, respiratory therapists, nutritionists, secretaries etc.,  Critical care time includes but is not restricted to just documentation time. Documentation can happen in parallel or sequential to care time depending on case mix urgency and priorities for the shift. So, overall critical Care Time devoted to patient care services described in this note is  30  Minutes.   This time reflects time of care of this signee Dr Dorethia Cave which includ does not reflect procedure time, or teaching time or supervisory time of  PA/NP/Med student/Med Resident etc but could involve care discussion time     Dr. Dorethia Cave, M.D., [facility].C.P Pulmonary and Critical Care Medicine Staff Physician, [facility] Pager: [phone], If no answer or between  15:00h - 7:00h: call [phone]  07/27/2024 8:40 AM   LABS    PULMONARY Recent Labs  Lab 07/20/24 0859 07/21/24 0847 07/24/24 1217  PHART  --  7.491* 7.507*  PCO2ART  --  34.3 36.3  PO2ART  --  63* 180*  HCO3 29.8* 26.2 28.8*  TCO2  --  27 30  O2SAT 84.9 94 100    CBC Recent Labs  Lab 07/24/24 0318 07/24/24 1217 07/25/24 0143 07/27/24 0620  HGB 15.2 14.3 15.0 14.9  HCT 47.6 42.0 47.8 45.8  WBC 13.1*  --  14.9* 14.8*  PLT 265  --  264 364    COAGULATION No results for input(s): INR in the last 168 hours.  CARDIAC  No results for input(s): TROPONINI in the last 168 hours. No results for input(s): PROBNP in the last 168 hours.   CHEMISTRY Recent Labs  Lab 07/23/24 0332 07/24/24 0318 07/24/24 1217 07/25/24 0143 07/26/24 0055 07/27/24 0620  NA 144 149* 149* 150* 145 151*  K 4.5 3.8 4.8 3.9 3.8 4.0  CL 110 112*  --  114* 109 109  CO2 23 23  --  24 28 25   GLUCOSE 222* 166*  --  238* 223* 269*  BUN 45* 41*  --  42* 38* 34*  CREATININE 1.08 0.94  --  1.03 0.88 0.62  CALCIUM 8.6* 8.9  --  8.9 8.9 9.1  MG  --   --   --  1.9  --  1.8   Estimated Creatinine Clearance: 76.8 mL/min (by C-G formula based on SCr of 0.62 mg/dL).   LIVER Recent Labs  Lab 07/22/24 0506  AST 59*  ALT 34  ALKPHOS 85  BILITOT 0.8  PROT 7.2  ALBUMIN 2.0*     INFECTIOUS Recent Labs  Lab 07/20/24 0859 07/20/24 1050 07/20/24 1834  LATICACIDVEN 3.1* 2.4* 1.5     ENDOCRINE CBG (last 3)  Recent Labs    07/26/24 2332 07/27/24 0312 07/27/24 0809  GLUCAP 222* 258* 261*         IMAGING x48h  - image(s) personally visualized  -   highlighted in bold No results found.

## 2024-07-27 NOTE — TOC Progression Note (Signed)
 Transition of Care [facility]) - Progression Note    Patient Details  Name: Lawrence Barnett MRN: [id number] Date of Birth: September 30, 1950  Transition of Care [facility]) CM/SW Contact  Lendia Dais, [place] Phone Number: 07/27/2024, 4:40 PM  Clinical Narrative: Pt is note yet medically appropriate for SNF.   CSW will continue to monitor.      Expected Discharge Plan: Skilled Nursing Facility Barriers to Discharge: Continued Medical Work up               Expected Discharge Plan and Services     Post Acute Care Choice: Skilled Nursing Facility Living arrangements for the past 2 months: Single Family Home                                       Social Drivers of Health (SDOH) Interventions SDOH Screenings   Food Insecurity: No Food Insecurity (07/23/2024)  Housing: Low Risk  (07/23/2024)  Transportation Needs: No Transportation Needs (07/23/2024)  Utilities: Not At Risk (07/23/2024)  Social Connections: Moderately Integrated (07/23/2024)  Tobacco Use: Medium Risk (07/17/2024)    Readmission Risk Interventions     No data to display

## 2024-07-28 ENCOUNTER — Inpatient Hospital Stay (HOSPITAL_COMMUNITY)

## 2024-07-28 DIAGNOSIS — R609 Edema, unspecified: Secondary | ICD-10-CM | POA: Diagnosis not present

## 2024-07-28 DIAGNOSIS — I6389 Other cerebral infarction: Secondary | ICD-10-CM | POA: Diagnosis not present

## 2024-07-28 DIAGNOSIS — I10 Essential (primary) hypertension: Secondary | ICD-10-CM | POA: Diagnosis not present

## 2024-07-28 DIAGNOSIS — I615 Nontraumatic intracerebral hemorrhage, intraventricular: Secondary | ICD-10-CM | POA: Diagnosis not present

## 2024-07-28 DIAGNOSIS — J9690 Respiratory failure, unspecified, unspecified whether with hypoxia or hypercapnia: Secondary | ICD-10-CM

## 2024-07-28 DIAGNOSIS — I610 Nontraumatic intracerebral hemorrhage in hemisphere, subcortical: Secondary | ICD-10-CM | POA: Diagnosis not present

## 2024-07-28 LAB — PHOSPHORUS: Phosphorus: 2.5 mg/dL (ref 2.5–4.6)

## 2024-07-28 LAB — COMPREHENSIVE METABOLIC PANEL WITH GFR
ALT: 70 U/L — ABNORMAL HIGH (ref 0–44)
AST: 51 U/L — ABNORMAL HIGH (ref 15–41)
Albumin: 2.1 g/dL — ABNORMAL LOW (ref 3.5–5.0)
Alkaline Phosphatase: 177 U/L — ABNORMAL HIGH (ref 38–126)
Anion gap: 12 (ref 5–15)
BUN: 37 mg/dL — ABNORMAL HIGH (ref 8–23)
CO2: 25 mmol/L (ref 22–32)
Calcium: 8.6 mg/dL — ABNORMAL LOW (ref 8.9–10.3)
Chloride: 109 mmol/L (ref 98–111)
Creatinine, Ser: 0.88 mg/dL (ref 0.61–1.24)
GFR, Estimated: >60 mL/min (ref >60)
Glucose, Bld: 233 mg/dL — ABNORMAL HIGH (ref 70–99)
Potassium: 3.9 mmol/L (ref 3.5–5.1)
Sodium: 146 mmol/L — ABNORMAL HIGH (ref 135–145)
Total Bilirubin: 0.7 mg/dL (ref 0.0–1.2)
Total Protein: 6.3 g/dL — ABNORMAL LOW (ref 6.5–8.1)

## 2024-07-28 LAB — GLUCOSE, CAPILLARY
Glucose-Capillary: 184 mg/dL — ABNORMAL HIGH (ref 70–99)
Glucose-Capillary: 199 mg/dL — ABNORMAL HIGH (ref 70–99)
Glucose-Capillary: 227 mg/dL — ABNORMAL HIGH (ref 70–99)
Glucose-Capillary: 229 mg/dL — ABNORMAL HIGH (ref 70–99)
Glucose-Capillary: 223 mg/dL — ABNORMAL HIGH (ref 70–99)
Glucose-Capillary: 242 mg/dL — ABNORMAL HIGH (ref 70–99)

## 2024-07-28 LAB — CBC
HCT: 47.4 % (ref 39.0–52.0)
Hemoglobin: 15.1 g/dL (ref 13.0–17.0)
MCH: 27.4 pg (ref 26.0–34.0)
MCHC: 31.9 g/dL (ref 30.0–36.0)
MCV: 86 fL (ref 80.0–100.0)
Platelets: 398 K/uL (ref 150–400)
RBC: 5.51 MIL/uL (ref 4.22–5.81)
RDW: 15.3 % (ref 11.5–15.5)
WBC: 14.8 K/uL — ABNORMAL HIGH (ref 4.0–10.5)
nRBC: 0.1 % (ref 0.0–0.2)

## 2024-07-28 LAB — MAGNESIUM: Magnesium: 2.1 mg/dL (ref 1.7–2.4)

## 2024-07-28 LAB — T3: T3, Total: 40 ng/dL — ABNORMAL LOW (ref 71–180)

## 2024-07-28 MED ORDER — PROPRANOLOL HCL 10 MG PO TABS
15.0000 mg | ORAL_TABLET | Freq: Three times a day (TID) | ORAL | Status: DC
  Administered 2024-07-28 – 2024-07-29 (×4): 15 mg
  Filled 2024-07-28 (×4): qty 1.5

## 2024-07-28 MED ORDER — HYDROCORTISONE SOD SUC (PF) 100 MG IJ SOLR
100.0000 mg | Freq: Every day | INTRAMUSCULAR | Status: DC
  Administered 2024-07-28 – 2024-07-29 (×2): 100 mg via INTRAVENOUS
  Filled 2024-07-28 (×2): qty 2

## 2024-07-28 MED ORDER — POTASSIUM & SODIUM PHOSPHATES 280-160-250 MG PO PACK
2.0000 | PACK | Freq: Once | ORAL | Status: AC
  Administered 2024-07-28: 2
  Filled 2024-07-28: qty 2

## 2024-07-28 MED ORDER — FENTANYL CITRATE (PF) 50 MCG/ML IJ SOSY
50.0000 ug | PREFILLED_SYRINGE | INTRAMUSCULAR | Status: DC | PRN
  Administered 2024-08-02 (×2): 50 ug via INTRAVENOUS
  Filled 2024-07-28 (×2): qty 1

## 2024-07-28 MED ORDER — HYDRALAZINE HCL 10 MG PO TABS
10.0000 mg | ORAL_TABLET | Freq: Four times a day (QID) | ORAL | Status: DC
  Administered 2024-07-28 – 2024-07-29 (×5): 10 mg
  Filled 2024-07-28 (×9): qty 1

## 2024-07-28 NOTE — Progress Notes (Signed)
 Around 1300 after +  BUE US  for DVT, pt BP site changed to LLE elevated 190s sbp, hydralazine  given recheck still elevated route changed to RLE, and labetalol  given. No significant decrease noted, Brooke, NP notified.   BUE US  and CT head completed. FC on RUE, RLE, sticks tongue out however does not open eyes to voice.

## 2024-07-28 NOTE — Progress Notes (Signed)
   CT results as below   IMPRESSION: 1. Continued evolution of a right thalamic hemorrhage with persistent mild edema. Decreased intraventricular hemorrhage. 2. Extensive chronic small vessel ischemic disease. The numerous small acute infarcts on the recent MRI are largely occult by CT.   Electronically signed by: Dasie Hamburg MD 07/28/2024 04:48 PM EST RP Workstation: [id number]    Per Dr JERRI  -  think the ICH is stable with expected evolution. He has BUE DVT with proximal clots, the risk of PE is high. if family likes to be aggressive, I think we can start heparin  IV per stroke protocol. if any neuro changes, we can do stat CT.   D/w Dwayne the niece - shared decision making - wants to  hold off IV heparin  for now - decide on IV heparin  gtt in morning     SIGNATURE    Dr. Dorethia Cave, M.D., F.C.C.P,  Pulmonary and Critical Care Medicine Staff Physician, [facility] Center Director - Interstitial Lung Disease  Program  Pulmonary Fibrosis [facility] at [facility] [place], [place], [address]   Pager: [phone], If no answer  -> Check AMION or Try [phone] Telephone (clinical office): [phone] Telephone (research): [phone]  6:17 PM 07/28/2024

## 2024-07-28 NOTE — Progress Notes (Signed)
 EEG complete - results pending

## 2024-07-28 NOTE — Progress Notes (Signed)
 eLink Physician-Brief Progress Note Patient Name: DAKARRI KESSINGER DOB: May 24, 1951 MRN: [id number]   Date of Service  07/28/2024  HPI/Events of Note  Patient is tolerating pressure support trials well per RT.  eICU Interventions  Respiratory instruction sent to RT okaying PRN pressure support trials as tolerated.        Semaj Coburn U Katrese Shell 07/28/2024, 12:33 AM

## 2024-07-28 NOTE — Plan of Care (Signed)
  Problem: Education: Goal: Knowledge of disease or condition will improve Outcome: Progressing Goal: Knowledge of secondary prevention will improve (MUST DOCUMENT ALL) Outcome: Progressing Goal: Knowledge of patient specific risk factors will improve (DELETE if not current risk factor) Outcome: Progressing   Problem: Intracerebral Hemorrhage Tissue Perfusion: Goal: Complications of Intracerebral Hemorrhage will be minimized Outcome: Progressing   Problem: Coping: Goal: Will verbalize positive feelings about self Outcome: Progressing Goal: Will identify appropriate support needs Outcome: Progressing   Problem: Health Behavior/Discharge Planning: Goal: Ability to manage health-related needs will improve Outcome: Progressing Goal: Goals will be collaboratively established with patient/family Outcome: Progressing   Problem: Self-Care: Goal: Ability to participate in self-care as condition permits will improve Outcome: Progressing Goal: Verbalization of feelings and concerns over difficulty with self-care will improve Outcome: Progressing Goal: Ability to communicate needs accurately will improve Outcome: Progressing   Problem: Nutrition: Goal: Risk of aspiration will decrease Outcome: Progressing Goal: Dietary intake will improve Outcome: Progressing   Problem: Education: Goal: Knowledge of General Education information will improve Description: Including pain rating scale, medication(s)/side effects and non-pharmacologic comfort measures Outcome: Progressing   Problem: Health Behavior/Discharge Planning: Goal: Ability to manage health-related needs will improve Outcome: Progressing   Problem: Clinical Measurements: Goal: Ability to maintain clinical measurements within normal limits will improve Outcome: Progressing Goal: Will remain free from infection Outcome: Progressing Goal: Diagnostic test results will improve Outcome: Progressing Goal: Respiratory  complications will improve Outcome: Progressing Goal: Cardiovascular complication will be avoided Outcome: Progressing   Problem: Activity: Goal: Risk for activity intolerance will decrease Outcome: Progressing   Problem: Nutrition: Goal: Adequate nutrition will be maintained Outcome: Progressing   Problem: Coping: Goal: Level of anxiety will decrease Outcome: Progressing   Problem: Elimination: Goal: Will not experience complications related to bowel motility Outcome: Progressing Goal: Will not experience complications related to urinary retention Outcome: Progressing   Problem: Pain Managment: Goal: General experience of comfort will improve and/or be controlled Outcome: Progressing   Problem: Safety: Goal: Ability to remain free from injury will improve Outcome: Progressing   Problem: Skin Integrity: Goal: Risk for impaired skin integrity will decrease Outcome: Progressing   Problem: Fluid Volume: Goal: Hemodynamic stability will improve Outcome: Progressing   Problem: Clinical Measurements: Goal: Diagnostic test results will improve Outcome: Progressing Goal: Signs and symptoms of infection will decrease Outcome: Progressing   Problem: Respiratory: Goal: Ability to maintain adequate ventilation will improve Outcome: Progressing   Problem: Education: Goal: Ability to describe self-care measures that may prevent or decrease complications (Diabetes Survival Skills Education) will improve Outcome: Progressing Goal: Individualized Educational Video(s) Outcome: Progressing   Problem: Coping: Goal: Ability to adjust to condition or change in health will improve Outcome: Progressing   Problem: Fluid Volume: Goal: Ability to maintain a balanced intake and output will improve Outcome: Progressing

## 2024-07-28 NOTE — Progress Notes (Signed)
 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 Pt was progressing thru his hospitalization with a superimposed infection 2/2 suspected aspiration pneumonia being empirically treated.    Pt's mental status declined yesterday evening prompting overnight evaluation via RRT. Pt was reportedly found obtunded, tachycardic and tachypneic. VSS however. Abx were broadened, pt was given fluids, and stat cth revealing mildly increased ventricular size concerning for mildly progressive hydrocephalus. Neuro was reached out to who recommended repeat cth to eval progression further at 0800.    Pt's labs at 0200 this am revealed multiple areas for improvement that could contribute to pt's mental status as well. Thru the course of the day however it appears that pt's mental status did not improve, bicarb/renal function/ammonia/lactate/sodium have been worsening as well. Cth has yet to be complete.    Ccm was consulted 11/16 PM as pt remains obtunded and req ICU transfer.  Intubated, on peripheral pressors, multiple abnormal labs  Pertinent  Medical History  Htn  Significant Hospital Events: Including procedures, antibiotic start and stop dates in addition to other pertinent events   11/11 admitted to ICU  11/13 transferred to floor  11/16 CCM consulted and transferred back to ICU 11/16, intubated, on peripheral pressors 11/17-did tolerate, mental status data remain poor 11/18-tolerated weaning pressor support Tolerating weaning, remains encephalopathic11/21 11/22 remains encephalopathic, on full vent support 11/23 encephalopathic - Has remained off sedation . No significant overnight events. Not responsive to  commands  07/27/24 - afebrile . On vent, fio2 30%. On Tube Feed. Niece Randine at bedside. 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 Says patient was never married and no kids. Live in [place] and moved to [place] 5y ago to be near sister . Now sister is in a facility. No overnight issues. They understand trach is an option in their care but reports no decision made TSHJ < 0.1 (same as 07/19/24) FT4 normalized 0.93 (was 2.3 and high 07/19/24)  Antibiotics Cefepime  11/15-20 Doxycycline  11/20>> Zyvox  11/16 Vancomycin  11/15. Metronidazole  11/15-11/16. Ceftriaxone  11/15.  Interim History / Subjective:   07/28/24: Did PSV. 30% fio2 on vent. On TF. Afebrile.  Per RN he has followed commands and is awake  with just weakness on right side. Per niece at tbedside -mental status is waxing and waning. Niece is not ready to decide on trach  Objective    Blood pressure 134/88, pulse 80, temperature 98.7 F (37.1 C), temperature source Axillary, resp. rate 20, height 5' 10 (1.778 m), weight 64.9 kg, SpO2 98%.    Vent Mode: PSV;CPAP FiO2 (%):  [30 %] 30 % PEEP:  [5 cmH20] 5 cmH20 Pressure Support:  [5 cmH20] 5 cmH20   Intake/Output Summary (Last 24 hours) at 07/28/2024 0839 Last data filed at 07/28/2024 0600 Gross per 24 hour  Intake 2850.02 ml  Output 3050 ml  Net -199.98 ml   Filed Weights   07/26/24 0211 07/27/24 0600 07/28/24 0500  Weight: 63.1 kg 68.1 kg 64.9 kg    Examination: General Appearance:  Looks criticall ill. On vent Head:  Normocephalic, without obvious abnormality, atraumatic Eyes:  PERRL - yes, conjunctiva/corneas - muddy     Ears:  Normal external ear canals, both ears  Nose:  G tube - yes Throat:  ETT TUBE - yes , OG tube - x Neck:  Supple,  No enlargement/tenderness/nodules Lungs: Clear to auscultation bilaterally, Ventilator   Synchrony - yes. ON PSV Heart:  S1 and S2 normal, no murmur, CVP - n.  Pressors - none Abdomen:  Soft, no masses, no  organomegaly Genitalia / Rectal:  Not done Extremities:  Extremities- intct. LE in bootes LUE MORE EDEMA (also side of hemiplegia) > RUE edema Skin:  ntact in exposed areas . Sacral area - not examined Neurologic:  Sedation - prn fent last dose 07/25/24 -> RASS - -4 . Moves all 4s - no. CAM-ICU - cannot test . Orientation - NOT oriented        Resolved problem list   Assessment and Plan   Intraparenchymal hemorrhage with IVH and hydrocephalus Multiple watershed strokes  11/25 - Patient remains encephalopathic/obtunded thugh RN says has itnermittently woken up and followed commands. This is despite Na improve  PLAN - get spot EEG  - Neuroprotective measures- normothermia, euglycemia, HOB greater than 30, head in neutral alignment, normocapnia, normoxia -Neurology continues to follow  Acute Respiratory Failure - intubated 07/19/24  07/28/2024 - > does NOT meet criteria for SBT/Extubation in setting of Acute Respiratory Failure due to obtunded encephalopathy  Plan  - CXR 11/126/25 - No extubation till more awake  - ongoing trach/ltac conversations   Hypernatremia  - 07/28/24 - Na 146 and better but encephalopathy continues  Plan -  free water  to 300mL Q4h -Lasix  to 40 twice a day for natriuresis   Edema UE despite lasix  - noted 07/28/24  Plan  0 get UE dopplers  Acute kidney injury - Stabilized  Hyperthyroidism -On methimazole , propranolol , steroids; D9 steroids 07/27/24  11/25: -T4 better; TSH will take a while to improve  Plan  - reduce hydrocort  to 100mg  Q24h - reduce inderal  to 15mg  Q8h (50%) - continue methimazole   - continue Potassium Iodide   Nutrition - Continue tube feeds  Maxillary sinusitis; last dose 112/25/25 AM  Plan  - monitor  Hypertension - Controlled  Plan  - monitor with lower inderal   FAMILY  Updated niece at bedside 07/24/2024: CCM  start discussing medium term program plans which may mean you will need a tracheostomy and  a PEG, will take it a day at a time   11/24 - Niece Randine updated at bedside 07/28/24 [place] Randine updated. Goals of care in progress    ATTESTATION & SIGNATURE   The patient Lawrence Barnett is critically ill with multiple organ systems failure and requires high complexity decision making for assessment and support, frequent evaluation and titration of therapies, application of advanced monitoring technologies and extensive interpretation of multiple databases and discussion with other appropriate health care personnel such as bedside nurses, social workers, case production designer, theatre/television/film, consultants, respiratory therapists, nutritionists, secretaries etc.,  Critical care time includes but is not restricted to just documentation time. Documentation can happen in parallel or sequential to care time depending on case mix urgency and priorities for the shift. So, overall critical Care Time devoted to patient care services described in this note is  30  Minutes.   This time reflects time of care of this signee Dr Dorethia Cave which includ does not reflect procedure time, or teaching time or supervisory time of PA/NP/Med student/Med Resident etc but could involve care discussion time     Dr. Dorethia Cave, M.D., [facility].C.P Pulmonary and Critical Care Medicine Staff Physician, [facility]  Pulmonary and Critical Care Pager: [phone], If no answer or between  15:00h - 7:00h: call [phone]  07/28/2024 8:39 AM   LABS    PULMONARY Recent Labs  Lab 07/21/24 0847 07/24/24 1217  PHART 7.491* 7.507*  PCO2ART 34.3 36.3  PO2ART 63* 180*  HCO3 26.2 28.8*  TCO2 27 30  O2SAT 94 100    CBC Recent Labs  Lab 07/25/24 0143 07/27/24 0620 07/28/24 0533  HGB 15.0 14.9 15.1  HCT 47.8 45.8 47.4  WBC 14.9* 14.8* 14.8*  PLT 264 364 398    COAGULATION No results for input(s): INR in the last 168 hours.  CARDIAC  No results for input(s): TROPONINI in the last 168 hours. No  results for input(s): PROBNP in the last 168 hours.   CHEMISTRY Recent Labs  Lab 07/24/24 0318 07/24/24 1217 07/25/24 0143 07/26/24 0055 07/27/24 0620 07/28/24 0533  NA 149* 149* 150* 145 151* 146*  K 3.8 4.8 3.9 3.8 4.0 3.9  CL 112*  --  114* 109 109 109  CO2 23  --  24 28 25 25   GLUCOSE 166*  --  238* 223* 269* 233*  BUN 41*  --  42* 38* 34* 37*  CREATININE 0.94  --  1.03 0.88 0.62 0.88  CALCIUM 8.9  --  8.9 8.9 9.1 8.6*  MG  --   --  1.9  --  1.8 2.1  PHOS  --   --   --   --   --  2.5   Estimated Creatinine Clearance: 66.6 mL/min (by C-G formula based on SCr of 0.88 mg/dL).   LIVER Recent Labs  Lab 07/22/24 0506 07/28/24 0533  AST 59* 51*  ALT 34 70*  ALKPHOS 85 177*  BILITOT 0.8 0.7  PROT 7.2 6.3*  ALBUMIN 2.0* 2.1*     INFECTIOUS No results for input(s): LATICACIDVEN, PROCALCITON in the last 168 hours.    ENDOCRINE CBG (last 3)  Recent Labs    07/27/24 2306 07/28/24 0304 07/28/24 0756  GLUCAP 173* 184* 199*         IMAGING x48h  - image(s) personally visualized  -   highlighted in bold No results found.

## 2024-07-28 NOTE — Progress Notes (Signed)
 eLink Physician-Brief Progress Note Patient Name: Lawrence Barnett DOB: 1950-11-12 MRN: [id number]   Date of Service  07/28/2024  HPI/Events of Note  Pt with vent dyssynchrony, triggering alarms.   eICU Interventions  RT changed vent mode to pressure control with some decrease in episodes.  Will give trial of fentanyl  50mcg IV as well.         Bunny Lowdermilk M DELA CRUZ 07/28/2024, 11:06 PM

## 2024-07-28 NOTE — Procedures (Signed)
 Patient Name: Lawrence Barnett  MRN: [id number]  Epilepsy Attending: Pastor Falling  Referring Physician/Provider: No ref. provider found      Date: 07/28/2024 Duration: 24 minutes   Patient history: Worsening mental status, EEG to evaluate for seizure   Level of alertness: Intubated and sedated   AEDs during EEG study: None   Technical aspects: This EEG study was done with scalp electrodes positioned according to the 10-20 International system of electrode placement. Electrical activity was reviewed with band pass filter of 1-70Hz , sensitivity of 7 uV/mm, display speed of 63mm/sec with a 60Hz  notched filter applied as appropriate. EEG data were recorded continuously and digitally stored.  Video monitoring was available and reviewed as appropriate.  Description: EEG showed continuous generalized theta slowing. Hyperventilation and photic stimulation were not performed.     ABNORMALITY - Continuous slow, generalized   IMPRESSION: This study is suggestive of mild diffuse brain dysfunction such as encephalopathy, nonspecific etiology but likely related to sedation, toxic-metabolic etiology. No seizures or epileptiform discharges were seen throughout the recording.    Pastor Falling MD Neurology

## 2024-07-28 NOTE — Progress Notes (Signed)
 STROKE TEAM PROGRESS NOTE   INTERIM HISTORY/SUBJECTIVE RN is at the bedside. Pt still intubated not on sedation, lethargic, not open eyes on voice, but seems able to follow commands to stick out of tongue and wiggle toes on the R. However, found to have BUEs DVT, neurology was called back again regarding potential anticoagulation decision.    OBJECTIVE  CBC    Component Value Date/Time   WBC 14.8 (H) 07/28/2024 0533   RBC 5.51 07/28/2024 0533   HGB 15.1 07/28/2024 0533   HGB 16.2 10/07/2012 1618   HCT 47.4 07/28/2024 0533   HCT 47.8 10/07/2012 1618   PLT 398 07/28/2024 0533   PLT 306 10/07/2012 1618   MCV 86.0 07/28/2024 0533   MCV 92 10/07/2012 1618   MCH 27.4 07/28/2024 0533   MCHC 31.9 07/28/2024 0533   RDW 15.3 07/28/2024 0533   RDW 13.9 10/07/2012 1618   LYMPHSABS 2.1 07/14/2024 1904   MONOABS 0.4 07/14/2024 1904   EOSABS 0.1 07/14/2024 1904   BASOSABS 0.0 07/14/2024 1904    BMET    Component Value Date/Time   NA 146 (H) 07/28/2024 0533   NA 139 10/08/2012 0431   K 3.9 07/28/2024 0533   K 4.0 10/08/2012 0431   CL 109 07/28/2024 0533   CL 107 10/08/2012 0431   CO2 25 07/28/2024 0533   CO2 25 10/08/2012 0431   GLUCOSE 233 (H) 07/28/2024 0533   GLUCOSE 133 (H) 10/08/2012 0431   BUN 37 (H) 07/28/2024 0533   BUN 10 10/08/2012 0431   CREATININE 0.88 07/28/2024 0533   CREATININE 0.83 10/08/2012 0431   CALCIUM 8.6 (L) 07/28/2024 0533   CALCIUM 9.4 10/08/2012 0431   GFRNONAA >60 07/28/2024 0533   GFRNONAA >60 10/08/2012 0431    IMAGING past 24 hours CT HEAD WO CONTRAST ( ) Result Date: 07/28/2024 EXAM: CT HEAD WITHOUT CONTRAST 07/28/2024 04:35:00 PM TECHNIQUE: CT of the head was performed without the administration of intravenous contrast. Automated exposure control, iterative reconstruction, and/or weight based adjustment of the mA/kV was utilized to reduce the radiation dose to as low as reasonably achievable. COMPARISON: Head CT 07/19/2024 and MRI 07/22/2024.  CLINICAL HISTORY: Stroke, hemorrhagic. FINDINGS: BRAIN AND VENTRICLES: An evolving hemorrhage centered in the right thalamus demonstrates mildly decreasing density compared to the prior CT. The residual hyperdense hemorrhage measures 3.1 x 2.1 x 1.6 cm. There is persistent mild surrounding edema and mild mass effect. Small volume intraventricular hemorrhage on the prior studies has decreased, with trace residual hemorrhage layering in the lateral ventricles. The ventricles are unchanged in size. A 1 cm hypodensity in the posterior right corona radiata corresponds to an acute infarct on the prior MRI, however the vast majority of the acute cerebral and cerebellar infarcts on MRI are occult by CT. Patchy to confluent hypodensities elsewhere in the cerebral white matter bilaterally are nonspecific but compatible with extensive chronic small vessel ischemic disease. There is mild cerebral atrophy. No acute large territory infarct, new intracranial hemorrhage, significant midline shift, or extra-axial fluid collection is identified. ORBITS: No acute abnormality. SINUSES: Mild mucosal thickening in the bilateral ethmoid and right maxillary sinuses. Persistent near complete opacification of the left maxillary sinus. Clear mastoid air cells. SOFT TISSUES AND SKULL: No acute soft tissue abnormality. No skull fracture. IMPRESSION: 1. Continued evolution of a right thalamic hemorrhage with persistent mild edema. Decreased intraventricular hemorrhage. 2. Extensive chronic small vessel ischemic disease. The numerous small acute infarcts on the recent MRI are largely occult by CT. Electronically  signed by: Dasie Hamburg MD 07/28/2024 04:48 PM EST RP Workstation: [id number]   VAS US  UPPER EXTREMITY VENOUS DUPLEX Result Date: 07/28/2024 UPPER VENOUS STUDY  Patient Name:  SHAWNMICHAEL PARENTEAU  Date of Exam:   07/28/2024 Medical Rec #: [id number]        Accession #:    [id number] Date of Birth: [birth year]        Patient Gender: M Patient  Age:   73 years Exam Location:  [facility] Procedure:      VAS US  UPPER EXTREMITY VENOUS DUPLEX Referring Phys: MURALI RAMASWAMY --------------------------------------------------------------------------------  Indications: Edema, and Rule out DVT. Comparison Study: No prior exam. Performing Technologist: Edilia Elden Appl  Examination Guidelines: A complete evaluation includes B-mode imaging, spectral Doppler, color Doppler, and power Doppler as needed of all accessible portions of each vessel. Bilateral testing is considered an integral part of a complete examination. Limited examinations for reoccurring indications may be performed as noted.  Right Findings: +----------+------------+---------+-----------+----------+--------------+ RIGHT     CompressiblePhasicitySpontaneousProperties   Summary     +----------+------------+---------+-----------+----------+--------------+ IJV           Full       Yes       Yes                             +----------+------------+---------+-----------+----------+--------------+ Subclavian    Full       Yes       Yes                             +----------+------------+---------+-----------+----------+--------------+ Axillary      None       No        No                              +----------+------------+---------+-----------+----------+--------------+ Brachial      None       No        No                              +----------+------------+---------+-----------+----------+--------------+ Radial                                              Not visualized +----------+------------+---------+-----------+----------+--------------+ Ulnar                                               Not visualized +----------+------------+---------+-----------+----------+--------------+ Cephalic      None       No        No                              +----------+------------+---------+-----------+----------+--------------+ Basilic        None       No        No                              +----------+------------+---------+-----------+----------+--------------+ Deep vein thrombosis noted in the  right axillary and brachial veins. Superficial vein thrombosis noted in the cephalic and basilic veins. The radial and ulnar veins were not visualized due to wrap around bandages in the forearm.  Left Findings: +----------+------------+---------+-----------+----------+-------+ LEFT      CompressiblePhasicitySpontaneousPropertiesSummary +----------+------------+---------+-----------+----------+-------+ IJV           Full       Yes       Yes                      +----------+------------+---------+-----------+----------+-------+ Subclavian    None       No        No                       +----------+------------+---------+-----------+----------+-------+ Axillary      None       No        No                       +----------+------------+---------+-----------+----------+-------+ Brachial      None       No        No                       +----------+------------+---------+-----------+----------+-------+ Radial        None       No        No                       +----------+------------+---------+-----------+----------+-------+ Ulnar         None       No        No                       +----------+------------+---------+-----------+----------+-------+ Cephalic      None       No        No                       +----------+------------+---------+-----------+----------+-------+ Basilic       None       No        No                       +----------+------------+---------+-----------+----------+-------+ Deep vein thrombosis noted in subclavian, axillary, brachial, radial and ulnar veins. Superficial veins noted in the cephalic and basilic veins.  Summary:  Right: Findings consistent with acute deep vein thrombosis involving the right axillary vein and right brachial veins. Findings consistent  with acute superficial vein thrombosis involving the right basilic vein and right cephalic vein.  Left: Findings consistent with acute deep vein thrombosis involving the left subclavian vein, left axillary vein, left brachial veins, left radial veins and left ulnar veins. Findings consistent with acute superficial vein thrombosis involving the left basilic vein and left cephalic vein.  *See table(s) above for measurements and observations.  Diagnosing physician: Penne Colorado MD Electronically signed by Penne Colorado MD on 07/28/2024 at 3:58:47 PM.    Final    EEG adult Result Date: 07/28/2024 Gregg Lek, MD     07/28/2024 12:21 PM Patient Name: DJANGO NGUYEN MRN: [id number] Epilepsy Attending: Lek Gregg Referring Physician/Provider: No ref. provider found     Date: 07/28/2024 Duration: 24 minutes Patient history: Worsening mental status, EEG to evaluate for seizure Level of alertness: Intubated and sedated  AEDs during EEG study: None Technical aspects: This EEG study was done with scalp electrodes positioned according to the 10-20 International system of electrode placement. Electrical activity was reviewed with band pass filter of 1-70Hz , sensitivity of 7 uV/mm, display speed of 52mm/sec with a 60Hz  notched filter applied as appropriate. EEG data were recorded continuously and digitally stored.  Video monitoring was available and reviewed as appropriate. Description: EEG showed continuous generalized theta slowing. Hyperventilation and photic stimulation were not performed.   ABNORMALITY - Continuous slow, generalized IMPRESSION: This study is suggestive of mild diffuse brain dysfunction such as encephalopathy, nonspecific etiology but likely related to sedation, toxic-metabolic etiology. No seizures or epileptiform discharges were seen throughout the recording. Pastor Falling MD Neurology     PHYSICAL EXAM  Temp:  [97.3 F (36.3 C)-98.9 F (37.2 C)] 97.6 F (36.4 C) (11/25 1507) Pulse Rate:   [71-91] 83 (11/25 1800) Resp:  [18-26] 22 (11/25 1800) BP: (101-191)/(62-92) 150/87 (11/25 1800) SpO2:  [97 %-98 %] 98 % (11/25 1800) FiO2 (%):  [30 %] 30 % (11/25 1604) Weight:  [64.9 kg] 64.9 kg (11/25 0500)  General - Well nourished, well developed, intubated not on sedation.  Ophthalmologic - fundi not visualized due to noncooperation.  Cardiovascular - Regular rate and rhythm.  Neuro - intubated not on sedation, eyes closed, not open on voice, but able to follow some simple commands like stick out tongue and moving toes on the right. With forced eye opening, eyes in mid position, blinking to visual threat more on the right, less on the left, not tracking, PERRL. Corneal reflex present, gag and cough present. Breathing over the vent.  Facial symmetry not able to test due to ET tube.  Tongue protrusion not cooperative. LUE and LLE no movement, RUE also no spontaneous movement, RLE slight withdraw but able to move toes as requested. Sensation, coordination and gait not tested.    ASSESSMENT/PLAN  Mr. Rawlin D Pohlmann is a 72 y.o. male with history of hypertension admitted for left-sided hemiplegia.  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 Stroke: numerous new punctate and small acute to early subacute infarcts, watershed distribution, etiology likely related to hypotension from sepsis Code Stroke CT head 7 mm acute IPH centered at right thalamus with IVH, no hydrocephalus CTA head & neck no LVO or hemodynamically significant stenosis, no vascular abnormality underlying right thalamic hemorrhage  Follow-up head CT slight interval increase in size of right thalamic hemorrhage, slightly increased IVH MRI 11/13 acute right thalamic IPH with IVH, stable, no evidence of underlying mass Repeat MRI 11/19 - numerous new  punctate and small acute to early subacute infarcts predominantly involving the bilateral centrum semiovale and deep white matter, with additional involvement of the bilateral basal ganglia, cerebral lobes, right cerebellum and right insula consistent with multifocal acute ischemia. 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  2D Echo EF 55-60% EEG x 2 encephalopathy, no seizure LDL 73 HgbA1c 5.6 UDS neg VTE prophylaxis - none No antithrombotic prior to admission, now on No antithrombotic secondary to IPH Therapy recommendations:  pending Disposition: Pending  Fever with tachycardia ?  Aspiration Leukocytosis Afebrile  WBC 13.1-14.9-14.8 Concern for aspiration CXR negative Off antibiotics now  Respiratory failure Intubated off sedation Not candidate for extubation  CCM for vent management  DVT BUE swelling UE venous doppler showed Left DVT in the left subclavian vein, left axillary vein, left brachial veins, left radial veins and left ulnar veins as well as right axillary vein and right brachial veins  Repeat CT head showed evolving ICH, OK to start heparin  IV per stroke protocol for PE prevention given proximal DVTs CCM discussed with niece who would like to revisit this decision in am  Hypertension Home meds: None BP stable Now on norvasc  10, hydralazine  10 Q6, inderal  15 tid BP goal < 160 Avoid low BP Long-term BP goal normotensive  Lipid management Home meds: None LDL 73, goal < 70 Elevated LFTs AST/ALT 51/70 Hold off statin until LFTs normalized  Hyperglycemia  CBGs SSI On insulin   AKI Hypernatremia Na 145--148--152-146 Cre 0.84-0.99-1.35-1.08--0.88 On free water  300 Q4 and TF @ 50 Close monitoring  Dysphagia Patient has post-stroke dysphagia On TF @ 50  Other Stroke Risk Factors Advanced age  Other medical issues   Hospital day # 14  This patient is critically ill due to respiratory  failure, stroke, ICH, DVTs and at significant risk of neurological worsening, death form recurrent stroke, hematoma expansion, cerebral edema, PE and seizure. This patient's care requires constant monitoring of vital signs, hemodynamics, respiratory and cardiac monitoring, review of multiple databases, neurological assessment, discussion with family, other specialists and medical decision making of high complexity. I spent 40 minutes of neurocritical care time in the care of this patient.  I discussed with CCM Dr. Geronimo.  Ary Cummins, MD PhD Stroke Neurology 07/28/2024 6:48 PM    To contact Stroke Continuity provider, please refer to [email or web address]. After hours, contact General Neurology

## 2024-07-28 NOTE — Progress Notes (Signed)
    Call from US  that UE doppler positive for DVT and SVT but just on 07/22/24 he has Intracranial hemorrhage  Plan  - d.w Dr Farris CUMMINS - get Head CT and if bleed in resolving stage and then can do heparin  gtt  - get doppler LE     SIGNATURE    Dr. Dorethia Cave, M.D., F.C.C.P,  Pulmonary and Critical Care Medicine Staff Physician, [facility] Center Director - Interstitial Lung Disease  Program  Pulmonary Fibrosis [facility] at [facility] [place], [place], [address]   Pager: [phone], If no answer  -> Check AMION or Try [phone] Telephone (clinical office): [phone] Telephone (research): [phone]  2:26 PM 07/28/2024   [email or web address]  IMPRESSION: 1. Since 07/16/2024, numerous new punctate and small acute to early subacute infarcts predominantly involving the bilateral centrum semiovale and deep white matter, with additional involvement of the bilateral basal ganglia, cerebral lobes, right cerebellum and right insula consistent with multifocal acute ischemia. 2. 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 3. Left maxillary sinus opacified with diffusion-restricting contents, correlate for acute sinusitis.   Electronically signed by: prentice spade 07/22/2024 01:41 PM EST RP Workstation: [id number]

## 2024-07-29 ENCOUNTER — Inpatient Hospital Stay (HOSPITAL_COMMUNITY)

## 2024-07-29 DIAGNOSIS — I6389 Other cerebral infarction: Secondary | ICD-10-CM | POA: Diagnosis not present

## 2024-07-29 DIAGNOSIS — Z86718 Personal history of other venous thrombosis and embolism: Secondary | ICD-10-CM

## 2024-07-29 DIAGNOSIS — I10 Essential (primary) hypertension: Secondary | ICD-10-CM | POA: Diagnosis not present

## 2024-07-29 DIAGNOSIS — R609 Edema, unspecified: Secondary | ICD-10-CM

## 2024-07-29 DIAGNOSIS — I615 Nontraumatic intracerebral hemorrhage, intraventricular: Secondary | ICD-10-CM | POA: Diagnosis not present

## 2024-07-29 DIAGNOSIS — I610 Nontraumatic intracerebral hemorrhage in hemisphere, subcortical: Secondary | ICD-10-CM | POA: Diagnosis not present

## 2024-07-29 LAB — BASIC METABOLIC PANEL WITH GFR
Anion gap: 12 (ref 5–15)
BUN: 40 mg/dL — ABNORMAL HIGH (ref 8–23)
CO2: 29 mmol/L (ref 22–32)
Calcium: 8.8 mg/dL — ABNORMAL LOW (ref 8.9–10.3)
Chloride: 107 mmol/L (ref 98–111)
Creatinine, Ser: 0.84 mg/dL (ref 0.61–1.24)
GFR, Estimated: >60 mL/min (ref >60)
Glucose, Bld: 136 mg/dL — ABNORMAL HIGH (ref 70–99)
Potassium: 3.6 mmol/L (ref 3.5–5.1)
Sodium: 148 mmol/L — ABNORMAL HIGH (ref 135–145)

## 2024-07-29 LAB — GLUCOSE, CAPILLARY
Glucose-Capillary: 113 mg/dL — ABNORMAL HIGH (ref 70–99)
Glucose-Capillary: 144 mg/dL — ABNORMAL HIGH (ref 70–99)
Glucose-Capillary: 178 mg/dL — ABNORMAL HIGH (ref 70–99)
Glucose-Capillary: 183 mg/dL — ABNORMAL HIGH (ref 70–99)
Glucose-Capillary: 162 mg/dL — ABNORMAL HIGH (ref 70–99)
Glucose-Capillary: 168 mg/dL — ABNORMAL HIGH (ref 70–99)

## 2024-07-29 LAB — HEPARIN LEVEL (UNFRACTIONATED): Heparin Unfractionated: 0.23 [IU]/mL — ABNORMAL LOW (ref 0.30–0.70)

## 2024-07-29 MED ORDER — FAMOTIDINE 20 MG PO TABS
20.0000 mg | ORAL_TABLET | Freq: Two times a day (BID) | ORAL | Status: DC
  Administered 2024-07-29 – 2024-08-03 (×10): 20 mg
  Filled 2024-07-29 (×11): qty 1

## 2024-07-29 MED ORDER — INSULIN ASPART 100 UNIT/ML IJ SOLN
0.0000 [IU] | INTRAMUSCULAR | Status: DC
  Administered 2024-07-29 – 2024-07-30 (×6): 3 [IU] via SUBCUTANEOUS
  Filled 2024-07-29 (×6): qty 3

## 2024-07-29 MED ORDER — HYDROCORTISONE SOD SUC (PF) 100 MG IJ SOLR
50.0000 mg | Freq: Every day | INTRAMUSCULAR | Status: AC
  Administered 2024-07-30: 50 mg via INTRAVENOUS
  Filled 2024-07-29: qty 2

## 2024-07-29 MED ORDER — HEPARIN (PORCINE) 25000 UT/250ML-% IV SOLN
800.0000 [IU]/h | INTRAVENOUS | Status: DC
  Administered 2024-07-29: 750 [IU]/h via INTRAVENOUS
  Filled 2024-07-29: qty 250

## 2024-07-29 MED ORDER — POTASSIUM CHLORIDE 20 MEQ PO PACK
40.0000 meq | PACK | Freq: Once | ORAL | Status: AC
  Administered 2024-07-29: 40 meq
  Filled 2024-07-29: qty 2

## 2024-07-29 MED ORDER — POTASSIUM IODIDE (EXPECTORANT) 1 GM/ML PO SOLN
250.0000 mg | Freq: Two times a day (BID) | ORAL | Status: DC
  Administered 2024-07-29: 250 mg
  Filled 2024-07-29 (×2): qty 0.25
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
## 2024-07-29 NOTE — Progress Notes (Signed)
 Pt ETT advanced 1 cm per CCM MD order. Pt tolerated well, vitals stable throughout, SpO2 stable throughout, RN aware, CCM MD notified.      07/29/24 1129  Airway 7.5 mm  Placement Date/Time: 07/19/24 2249   Placed By: ICU physician  Airway Device: Endotracheal Tube  ETT Types: Oral  Size (mm): 7.5 mm  Cuffed: Cuffed  Secured at (cm): 24 cm  Measured From: Lips  Secured at (cm) (S)  25 cm (per CCM)  Measured From Lips  Secured Location Left  Secured By English As A Second Language Teacher No  Tube Holder Repositioned Yes  Prone position No  Cuff Pressure (cm H2O) Clear OR 27-39 CmH2O  Site Condition Dry

## 2024-07-29 NOTE — Progress Notes (Signed)
 STROKE TEAM PROGRESS NOTE   INTERIM HISTORY/SUBJECTIVE No family is at the bedside. Pt still intubated not on sedation, not open eyes on voice, did not follow any commands today. Not responsive. Per note, CCM Dr. Geronimo discussed with niece and she agreed with anticoagulation.    OBJECTIVE  CBC    Component Value Date/Time   WBC 14.8 (H) 07/28/2024 0533   RBC 5.51 07/28/2024 0533   HGB 15.1 07/28/2024 0533   HGB 16.2 10/07/2012 1618   HCT 47.4 07/28/2024 0533   HCT 47.8 10/07/2012 1618   PLT 398 07/28/2024 0533   PLT 306 10/07/2012 1618   MCV 86.0 07/28/2024 0533   MCV 92 10/07/2012 1618   MCH 27.4 07/28/2024 0533   MCHC 31.9 07/28/2024 0533   RDW 15.3 07/28/2024 0533   RDW 13.9 10/07/2012 1618   LYMPHSABS 2.1 07/14/2024 1904   MONOABS 0.4 07/14/2024 1904   EOSABS 0.1 07/14/2024 1904   BASOSABS 0.0 07/14/2024 1904    BMET    Component Value Date/Time   NA 148 (H) 07/29/2024 0448   NA 139 10/08/2012 0431   K 3.6 07/29/2024 0448   K 4.0 10/08/2012 0431   CL 107 07/29/2024 0448   CL 107 10/08/2012 0431   CO2 29 07/29/2024 0448   CO2 25 10/08/2012 0431   GLUCOSE 136 (H) 07/29/2024 0448   GLUCOSE 133 (H) 10/08/2012 0431   BUN 40 (H) 07/29/2024 0448   BUN 10 10/08/2012 0431   CREATININE 0.84 07/29/2024 0448   CREATININE 0.83 10/08/2012 0431   CALCIUM 8.8 (L) 07/29/2024 0448   CALCIUM 9.4 10/08/2012 0431   GFRNONAA >60 07/29/2024 0448   GFRNONAA >60 10/08/2012 0431    IMAGING past 24 hours DG CHEST PORT 1 VIEW Result Date: 07/29/2024 EXAM: 1 VIEW(S) XRAY OF THE CHEST 07/29/2024 05:22:00 AM COMPARISON: Portable chest 07/19/2024. CLINICAL HISTORY: Endotracheal tube present. FINDINGS: LINES, TUBES AND DEVICES: Endotracheal tube is in place with its tip 4.9 cm from the carina. The feeding tube passes well into the stomach; however, its radiopaque tip is out of view. LUNGS AND PLEURA: Chronic asymmetric right apical pleuroparenchymal scarring. The lungs are clear of  infiltrates. No significant pleural effusion. No pneumothorax. HEART AND MEDIASTINUM: Aortic atherosclerosis and tortuosity with stable mediastinum. The cardiac size is normal. BONES AND SOFT TISSUES: No acute osseous abnormality. IMPRESSION: 1. Endotracheal tube in place with tip 4.9 cm from the carina. 2. No acute cardiopulmonary findings. Electronically signed by: Francis Quam MD 07/29/2024 06:28 AM EST RP Workstation: [id number]   CT HEAD WO CONTRAST ( ) Result Date: 07/28/2024 EXAM: CT HEAD WITHOUT CONTRAST 07/28/2024 04:35:00 PM TECHNIQUE: CT of the head was performed without the administration of intravenous contrast. Automated exposure control, iterative reconstruction, and/or weight based adjustment of the mA/kV was utilized to reduce the radiation dose to as low as reasonably achievable. COMPARISON: Head CT 07/19/2024 and MRI 07/22/2024. CLINICAL HISTORY: Stroke, hemorrhagic. FINDINGS: BRAIN AND VENTRICLES: An evolving hemorrhage centered in the right thalamus demonstrates mildly decreasing density compared to the prior CT. The residual hyperdense hemorrhage measures 3.1 x 2.1 x 1.6 cm. There is persistent mild surrounding edema and mild mass effect. Small volume intraventricular hemorrhage on the prior studies has decreased, with trace residual hemorrhage layering in the lateral ventricles. The ventricles are unchanged in size. A 1 cm hypodensity in the posterior right corona radiata corresponds to an acute infarct on the prior MRI, however the vast majority of the acute cerebral and cerebellar infarcts on MRI  are occult by CT. Patchy to confluent hypodensities elsewhere in the cerebral white matter bilaterally are nonspecific but compatible with extensive chronic small vessel ischemic disease. There is mild cerebral atrophy. No acute large territory infarct, new intracranial hemorrhage, significant midline shift, or extra-axial fluid collection is identified. ORBITS: No acute abnormality. SINUSES:  Mild mucosal thickening in the bilateral ethmoid and right maxillary sinuses. Persistent near complete opacification of the left maxillary sinus. Clear mastoid air cells. SOFT TISSUES AND SKULL: No acute soft tissue abnormality. No skull fracture. IMPRESSION: 1. Continued evolution of a right thalamic hemorrhage with persistent mild edema. Decreased intraventricular hemorrhage. 2. Extensive chronic small vessel ischemic disease. The numerous small acute infarcts on the recent MRI are largely occult by CT. Electronically signed by: Dasie Hamburg MD 07/28/2024 04:48 PM EST RP Workstation: [id number]   VAS US  UPPER EXTREMITY VENOUS DUPLEX Result Date: 07/28/2024 UPPER VENOUS STUDY  Patient Name:  Lawrence Barnett  Date of Exam:   07/28/2024 Medical Rec #: [id number]        Accession #:    [id number] Date of Birth: 01-24-1951        Patient Gender: M Patient Age:   73 years Exam Location:  [facility] Procedure:      VAS US  UPPER EXTREMITY VENOUS DUPLEX Referring Phys: MURALI RAMASWAMY --------------------------------------------------------------------------------  Indications: Edema, and Rule out DVT. Comparison Study: No prior exam. Performing Technologist: Edilia Elden Appl  Examination Guidelines: A complete evaluation includes B-mode imaging, spectral Doppler, color Doppler, and power Doppler as needed of all accessible portions of each vessel. Bilateral testing is considered an integral part of a complete examination. Limited examinations for reoccurring indications may be performed as noted.  Right Findings: +----------+------------+---------+-----------+----------+--------------+ RIGHT     CompressiblePhasicitySpontaneousProperties   Summary     +----------+------------+---------+-----------+----------+--------------+ IJV           Full       Yes       Yes                             +----------+------------+---------+-----------+----------+--------------+ Subclavian    Full       Yes        Yes                             +----------+------------+---------+-----------+----------+--------------+ Axillary      None       No        No                              +----------+------------+---------+-----------+----------+--------------+ Brachial      None       No        No                              +----------+------------+---------+-----------+----------+--------------+ Radial                                              Not visualized +----------+------------+---------+-----------+----------+--------------+ Ulnar  Not visualized +----------+------------+---------+-----------+----------+--------------+ Cephalic      None       No        No                              +----------+------------+---------+-----------+----------+--------------+ Basilic       None       No        No                              +----------+------------+---------+-----------+----------+--------------+ Deep vein thrombosis noted in the right axillary and brachial veins. Superficial vein thrombosis noted in the cephalic and basilic veins. The radial and ulnar veins were not visualized due to wrap around bandages in the forearm.  Left Findings: +----------+------------+---------+-----------+----------+-------+ LEFT      CompressiblePhasicitySpontaneousPropertiesSummary +----------+------------+---------+-----------+----------+-------+ IJV           Full       Yes       Yes                      +----------+------------+---------+-----------+----------+-------+ Subclavian    None       No        No                       +----------+------------+---------+-----------+----------+-------+ Axillary      None       No        No                       +----------+------------+---------+-----------+----------+-------+ Brachial      None       No        No                        +----------+------------+---------+-----------+----------+-------+ Radial        None       No        No                       +----------+------------+---------+-----------+----------+-------+ Ulnar         None       No        No                       +----------+------------+---------+-----------+----------+-------+ Cephalic      None       No        No                       +----------+------------+---------+-----------+----------+-------+ Basilic       None       No        No                       +----------+------------+---------+-----------+----------+-------+ Deep vein thrombosis noted in subclavian, axillary, brachial, radial and ulnar veins. Superficial veins noted in the cephalic and basilic veins.  Summary:  Right: Findings consistent with acute deep vein thrombosis involving the right axillary vein and right brachial veins. Findings consistent with acute superficial vein thrombosis involving the right basilic vein and right cephalic vein.  Left: Findings consistent with acute deep vein thrombosis involving the left subclavian vein, left axillary vein, left  brachial veins, left radial veins and left ulnar veins. Findings consistent with acute superficial vein thrombosis involving the left basilic vein and left cephalic vein.  *See table(s) above for measurements and observations.  Diagnosing physician: Penne Colorado MD Electronically signed by Penne Colorado MD on 07/28/2024 at 3:58:47 PM.    Final    EEG adult Result Date: 07/28/2024 Gregg Lek, MD     07/28/2024 12:21 PM Patient Name: Lawrence Barnett MRN: [id number] Epilepsy Attending: Lek Gregg Referring Physician/Provider: No ref. provider found     Date: 07/28/2024 Duration: 24 minutes Patient history: Worsening mental status, EEG to evaluate for seizure Level of alertness: Intubated and sedated AEDs during EEG study: None Technical aspects: This EEG study was done with scalp electrodes positioned according to  the 10-20 International system of electrode placement. Electrical activity was reviewed with band pass filter of 1-70Hz , sensitivity of 7 uV/mm, display speed of 85mm/sec with a 60Hz  notched filter applied as appropriate. EEG data were recorded continuously and digitally stored.  Video monitoring was available and reviewed as appropriate. Description: EEG showed continuous generalized theta slowing. Hyperventilation and photic stimulation were not performed.   ABNORMALITY - Continuous slow, generalized IMPRESSION: This study is suggestive of mild diffuse brain dysfunction such as encephalopathy, nonspecific etiology but likely related to sedation, toxic-metabolic etiology. No seizures or epileptiform discharges were seen throughout the recording. Lek Gregg MD Neurology     PHYSICAL EXAM  Temp:  [96.5 F (35.8 C)-99.5 F (37.5 C)] 99.5 F (37.5 C) (11/26 0719) Pulse Rate:  [73-89] 75 (11/26 0600) Resp:  [19-34] 25 (11/26 0600) BP: (103-191)/(66-129) 145/74 (11/26 0600) SpO2:  [97 %-100 %] 98 % (11/26 0600) FiO2 (%):  [30 %-40 %] 40 % (11/26 0750) Weight:  [64.5 kg] 64.5 kg (11/26 0500)  General - Well nourished, well developed, intubated not on sedation.  Ophthalmologic - fundi not visualized due to noncooperation.  Cardiovascular - Regular rate and rhythm.  Neuro - intubated not on sedation, eyes closed, not open on voice, but able to follow some simple commands like stick out tongue and moving toes on the right. With forced eye opening, eyes in mid position, blinking to visual threat more on the right, less on the left, not tracking, PERRL. Corneal reflex present, gag and cough present. Breathing over the vent.  Facial symmetry not able to test due to ET tube.  Tongue protrusion not cooperative. LUE and LLE no movement, RUE also no spontaneous movement, RLE slight withdraw but able to move toes as requested. Sensation, coordination and gait not tested.    ASSESSMENT/PLAN  Lawrence Barnett is a 73 y.o. male with history of hypertension admitted for left-sided hemiplegia.  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 Stroke: numerous new punctate and small acute to early subacute infarcts, watershed distribution, etiology likely related to hypotension from sepsis Code Stroke CT head 7 mm acute IPH centered at right thalamus with IVH, no hydrocephalus CTA head & neck no LVO or hemodynamically significant stenosis, no vascular abnormality underlying right thalamic hemorrhage  Follow-up head CT slight interval increase in size of right thalamic hemorrhage, slightly increased IVH MRI 11/13 acute right thalamic IPH with IVH, stable, no evidence of underlying mass Repeat MRI 11/19 - numerous new punctate and small acute to early subacute  infarcts predominantly involving the bilateral centrum semiovale and deep white matter, with additional involvement of the bilateral basal ganglia, cerebral lobes, right cerebellum and right insula consistent with multifocal acute ischemia. 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  2D Echo EF 55-60% EEG x 2 encephalopathy, no seizure LDL 73 HgbA1c 5.6 UDS neg VTE prophylaxis - none No antithrombotic prior to admission, now on heparin  IV due to BUE proximal DVT Therapy recommendations:  pending Disposition: Pending  Fever with tachycardia ?  Aspiration Leukocytosis Afebrile now WBC 13.1-14.9-14.8 Concern for aspiration CXR negative Off antibiotics   Respiratory failure Intubated off sedation Not candidate for extubation  CCM for vent management Likely need trach if aggressive care  DVT BUE swelling UE venous doppler showed Left DVT in the left subclavian vein, left axillary vein, left brachial veins, left  radial veins and left ulnar veins as well as right axillary vein and right brachial veins  Repeat CT head showed evolving ICH, OK to start heparin  IV per stroke protocol for PE prevention given proximal DVTs CCM discussed with niece who would like to start [facility] today  Hypertension Home meds: None BP stable Now on norvasc  10, hydralazine  10 Q6, inderal  15 tid BP goal < 160 Avoid low BP Long-term BP goal normotensive  Lipid management Home meds: None LDL 73, goal < 70 Elevated LFTs AST/ALT 51/70 Hold off statin until LFTs normalized  Hyperglycemia  CBGs SSI On insulin   AKI Hypernatremia Na 145--148--152-146 Cre 0.84-0.99-1.35-1.08--0.88 On free water  300 Q4 and TF @ 50 Close monitoring  Dysphagia Patient has post-stroke dysphagia On TF @ 50  Other Stroke Risk Factors Advanced age  Other medical issues Hyperthyroidism - TSH < 0.001, FT4 normal, on methimazole    Hospital day # 15  This patient is critically ill due to respiratory failure, stroke, ICH, DVTs and at significant risk of neurological worsening, death form recurrent stroke, hematoma expansion, cerebral edema, PE and seizure. This patient's care requires constant monitoring of vital signs, hemodynamics, respiratory and cardiac monitoring, review of multiple databases, neurological assessment, discussion with family, other specialists and medical decision making of high complexity. I spent 35 minutes of neurocritical care time in the care of this patient.   Ary Cummins, MD PhD Stroke Neurology 07/29/2024 10:43 AM    To contact Stroke Continuity provider, please refer to [email or web address]. After hours, contact General Neurology

## 2024-07-29 NOTE — Progress Notes (Addendum)
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 Pt was progressing thru his hospitalization with a superimposed infection 2/2 suspected aspiration pneumonia being empirically treated.    Pt's mental status declined yesterday evening prompting overnight evaluation via RRT. Pt was reportedly found obtunded, tachycardic and tachypneic. VSS however. Abx were broadened, pt was given fluids, and stat cth revealing mildly increased ventricular size concerning for mildly progressive hydrocephalus. Neuro was reached out to who recommended repeat cth to eval progression further at 0800.    Pt's labs at 0200 this am revealed multiple areas for improvement that could contribute to pt's mental status as well. Thru the course of the day however it appears that pt's mental status did not improve, bicarb/renal function/ammonia/lactate/sodium have been worsening as well. Cth has yet to be complete.    Ccm was consulted 11/16 PM as pt remains obtunded and req ICU transfer.  Intubated, on peripheral pressors, multiple abnormal labs  Pertinent  Medical History  Htn  Significant Hospital Events: Including procedures, antibiotic start and stop dates in addition to other pertinent events   11/11 admitted to ICU  11/13 transferred to floor  11/16 CCM consulted and transferred back to ICU 11/16, intubated, on peripheral pressors 11/17-did tolerate, mental status data remain poor 11/18-tolerated weaning pressor support Tolerating weaning, remains encephalopathic11/21 11/22 remains encephalopathic, on full vent support 11/23 encephalopathic - Has remained off sedation . No significant overnight events. Not responsive to  commands  07/27/24 - afebrile . On vent, fio2 30%. On Tube Feed. Niece Randine at bedside. 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 Says patient was never married and no kids. Live in [place] and moved to [place] 5y ago to be near sister . Now sister is in a facility. No overnight issues. They understand trach is an option in their care but reports no decision made TSHJ < 0.1 (same as 07/19/24) FT4 normalized 0.93 (was 2.3 and high 07/19/24)  07/28/24: Did PSV. 30% fio2 on vent. On TF. Afebrile.  Per RN he has followed commands and is awake  with just weakness on right side. Per niece at tbedside -mental status is waxing and waning. Niece is not ready to decide on trach UE DVT - > CT head with natural progresson of bleed -> Niece not decided yet on IV heparin  but rcommended by stroke team Thyroid  meds: reduced Hydrocort  and Inderal  dosing EEG - metabolic encephaloathy   Interim History / Subjective:    07/29/24 - remains on vent. 40%. Afebrile v 76F . Per RN followed commands yesterday but this morning remains comatose though did have gag (both to RN , niece and MD). Niece gave verbal consent for IV heparin . Niece not readyy for trach   - still on lasix , looks dry but for UE edema which is DVT. CXR is dry  Objective    Blood pressure (!) 145/74, pulse 75, temperature 99.5 F (37.5 C), temperature source Axillary, resp. rate (!) 25, height 5' 10 08/04/2024 m), weight 64.5 kg, SpO2 98%.    Vent Mode: PSV;CPAP FiO2 (%):  [30 %-40 %] 40 % Set Rate:  [30 bmp] 30 bmp Vt Set:  [440 mL] 440 mL PEEP:  [  5 cmH20] 5 cmH20 Pressure Support:  [5 cmH20] 5 cmH20 Plateau Pressure:  [15 cmH20] 15 cmH20   Intake/Output Summary (Last 24 hours) at 07/29/2024 0902 Last data filed at 07/29/2024 9195 Gross per 24 hour  Intake 2850 ml  Output 2550 ml  Net 300 ml   Filed Weights   07/27/24 0600 07/28/24 0500 07/29/24 0500  Weight: 68.1 kg 64.9 kg 64.5 kg    Examination:   General  Appearance:  Looks criticall ill  Head:  Normocephalic, without obvious abnormality, atraumatic Eyes:  PERRL - yes, conjunctiva/corneas - muddy     Ears:  Normal external ear canals, both ears Nose:  G tube - yes Throat:  ETT TUBE - yes , OG tube - no Neck:  Supple,  No enlargement/tenderness/nodules Lungs: Clear to auscultation bilaterally, Ventilator   Synchrony - es Heart:  S1 and S2 normal, no murmur, CVP - no.  Pressors - no Abdomen:  Soft, no masses, no organomegaly Genitalia / Rectal:  Not done Extremities:  Extremities- intact Skin:  ntact in exposed areas . Sacral area - no decub per RN Neurologic:  Sedation - none -> RASS - -4 . Moves all 4s - no. CAM-ICU - no . Orientation - no        Resolved problem list   Assessment and Plan   Intraparenchymal hemorrhage with IVH and hydrocephalus Multiple watershed strokes - CT 1/25 with improved hge  11/26 - Patient remains encephalopathic/obtunded thugh RN says has itnermittently woken up and followed commands. This is despite Na improve. EEG with toxic metabolich encephalopathy  PLAN - Neuroprotective measures- normothermia, euglycemia, HOB greater than 30, head in neutral alignment, normocapnia, normoxia -Neurology continues to follow - STOP LACTULOSE   - recheck ammonia  Acute Respiratory Failure - intubated 07/19/24  07/29/2024 - > does NOT meet criteria for SBT/Extubation in setting of Acute Respiratory Failure due to obtunded encephalopathy  Plan - No extubation till more awake - ongoing trach/ltac conversations - niece 07/29/24 feels that patient needs more time in ICU with/without EET tube before any decision on trach is made (recent example of her mom who survived sepss)   Hypernatremia  - 07/29/24 - Na1  148 and some worse but encephalopathy continues. Clinically looks dry  Plan -  free water  to Q4h -> - stop lasix    Edema UE despite lasix  - noted 07/28/24 UE DVT Left side diagnosed  07/28/24  Plan  IV heparin  gtt from 07/29/24 (post niece consent)  Acute kidney injury - Stabilized  Plan  - montiro off lasix   Hyperthyroidism -On methimazole , propranolol , steroids; D9 steroids 07/27/24  11/25: -T4 better; TSH will take a while to improve   Plan  - reduce hydrocort   - stopping inderal  - continue methimazole   - continue Potassium Iodide  but rduce dose 07/29/24  Nutrition - Continue tube feeds  Maxillary sinusitis; last dose 112/25/25 AM  Plan  - monitor  Hypertension - Controlled  Plan  - monitor off inderal   FAMILY  Updated niece at bedside 07/24/2024: CCM  start discussing medium term program plans which may mean you will need a tracheostomy and a PEG, will take it a day at a time   11/24 - Niece Randine updated at bedside 07/28/24 [place] Randine updated. Goals of care in progress 07/29/24 - iPAL done    Interdisciplinary Goals of Care Family Meeting   Date carried out:: 07/29/2024  Location of the meeting: Bedside  Member's involved: Physician, Bedside Registered Nurse, Family Member or next  of kin, and Other: Niece Location Manager or environmental health practitioner: nephew with support from nience    Discussion: We discussed goals of care for Shalev D Yamada .    Code status: Full Code  Disposition: Continue current acute care - she wil reflect back on code status with nephew and also wants more time on/off ET tube before deciding on trach   Time spent for the meeting: 10 min    ATTESTATION & SIGNATURE   The patient BRIANNA ESSON is critically ill with multiple organ systems failure and requires high complexity decision making for assessment and support, frequent evaluation and titration of therapies, application of advanced monitoring technologies and extensive interpretation of multiple databases and discussion with other appropriate health care personnel such as bedside nurses, social workers, case production designer, theatre/television/film,  consultants, respiratory therapists, nutritionists, secretaries etc.,  Critical care time includes but is not restricted to just documentation time. Documentation can happen in parallel or sequential to care time depending on case mix urgency and priorities for the shift. So, overall critical Care Time devoted to patient care services described in this note is  30  Minutes.   This time reflects time of care of this signee Dr Dorethia Cave which includ does not reflect procedure time, or teaching time or supervisory time of PA/NP/Med student/Med Resident etc but could involve care discussion time     Dr. Dorethia Cave, M.D., [facility].C.P Pulmonary and Critical Care Medicine Staff Physician, [facility] [place] Pulmonary and Critical Care Pager: [phone], If no answer or between  15:00h - 7:00h: call [phone]  07/29/2024 9:39 AM   LABS    PULMONARY Recent Labs  Lab 07/24/24 1217  PHART 7.507*  PCO2ART 36.3  PO2ART 180*  HCO3 28.8*  TCO2 30  O2SAT 100    CBC Recent Labs  Lab 07/25/24 0143 07/27/24 0620 07/28/24 0533  HGB 15.0 14.9 15.1  HCT 47.8 45.8 47.4  WBC 14.9* 14.8* 14.8*  PLT 264 364 398    COAGULATION No results for input(s): INR in the last 168 hours.  CARDIAC  No results for input(s): TROPONINI in the last 168 hours. No results for input(s): PROBNP in the last 168 hours.   CHEMISTRY Recent Labs  Lab 07/25/24 0143 07/26/24 0055 07/27/24 0620 07/28/24 0533 07/29/24 0448  NA 150* 145 151* 146* 148*  K 3.9 3.8 4.0 3.9 3.6  CL 114* 109 109 109 107  CO2 24 28 25 25 29   GLUCOSE 238* 223* 269* 233* 136*  BUN 42* 38* 34* 37* 40*  CREATININE 1.03 0.88 0.62 0.88 0.84  CALCIUM 8.9 8.9 9.1 8.6* 8.8*  MG 1.9  --  1.8 2.1  --   PHOS  --   --   --  2.5  --    Estimated Creatinine Clearance: 69.3 mL/min (by C-G formula based on SCr of 0.84 mg/dL).   LIVER Recent Labs  Lab 07/28/24 0533  AST 51*  ALT 70*  ALKPHOS 177*   BILITOT 0.7  PROT 6.3*  ALBUMIN 2.1*     INFECTIOUS No results for input(s): LATICACIDVEN, PROCALCITON in the last 168 hours.    ENDOCRINE CBG (last 3)  Recent Labs    07/28/24 2302 07/29/24 0327 07/29/24 0725  GLUCAP 242* 113* 144*         IMAGING x48h  - image(s) personally visualized  -   highlighted in bold DG CHEST PORT 1 VIEW Result  Date: 07/29/2024 EXAM: 1 VIEW(S) XRAY OF THE CHEST 07/29/2024 05:22:00 AM COMPARISON: Portable chest 07/19/2024. CLINICAL HISTORY: Endotracheal tube present. FINDINGS: LINES, TUBES AND DEVICES: Endotracheal tube is in place with its tip 4.9 cm from the carina. The feeding tube passes well into the stomach; however, its radiopaque tip is out of view. LUNGS AND PLEURA: Chronic asymmetric right apical pleuroparenchymal scarring. The lungs are clear of infiltrates. No significant pleural effusion. No pneumothorax. HEART AND MEDIASTINUM: Aortic atherosclerosis and tortuosity with stable mediastinum. The cardiac size is normal. BONES AND SOFT TISSUES: No acute osseous abnormality. IMPRESSION: 1. Endotracheal tube in place with tip 4.9 cm from the carina. 2. No acute cardiopulmonary findings. Electronically signed by: Francis Quam MD 07/29/2024 06:28 AM EST RP Workstation: [id number]   CT HEAD WO CONTRAST ( ) Result Date: 07/28/2024 EXAM: CT HEAD WITHOUT CONTRAST 07/28/2024 04:35:00 PM TECHNIQUE: CT of the head was performed without the administration of intravenous contrast. Automated exposure control, iterative reconstruction, and/or weight based adjustment of the mA/kV was utilized to reduce the radiation dose to as low as reasonably achievable. COMPARISON: Head CT 07/19/2024 and MRI 07/22/2024. CLINICAL HISTORY: Stroke, hemorrhagic. FINDINGS: BRAIN AND VENTRICLES: An evolving hemorrhage centered in the right thalamus demonstrates mildly decreasing density compared to the prior CT. The residual hyperdense hemorrhage measures 3.1 x 2.1 x 1.6 cm.  There is persistent mild surrounding edema and mild mass effect. Small volume intraventricular hemorrhage on the prior studies has decreased, with trace residual hemorrhage layering in the lateral ventricles. The ventricles are unchanged in size. A 1 cm hypodensity in the posterior right corona radiata corresponds to an acute infarct on the prior MRI, however the vast majority of the acute cerebral and cerebellar infarcts on MRI are occult by CT. Patchy to confluent hypodensities elsewhere in the cerebral white matter bilaterally are nonspecific but compatible with extensive chronic small vessel ischemic disease. There is mild cerebral atrophy. No acute large territory infarct, new intracranial hemorrhage, significant midline shift, or extra-axial fluid collection is identified. ORBITS: No acute abnormality. SINUSES: Mild mucosal thickening in the bilateral ethmoid and right maxillary sinuses. Persistent near complete opacification of the left maxillary sinus. Clear mastoid air cells. SOFT TISSUES AND SKULL: No acute soft tissue abnormality. No skull fracture. IMPRESSION: 1. Continued evolution of a right thalamic hemorrhage with persistent mild edema. Decreased intraventricular hemorrhage. 2. Extensive chronic small vessel ischemic disease. The numerous small acute infarcts on the recent MRI are largely occult by CT. Electronically signed by: Dasie Hamburg MD 07/28/2024 04:48 PM EST RP Workstation: [id number]   VAS US  UPPER EXTREMITY VENOUS DUPLEX Result Date: 07/28/2024 UPPER VENOUS STUDY  Patient Name:  MOSIE ANGUS  Date of Exam:   07/28/2024 Medical Rec #: [id number]        Accession #:    [id number] Date of Birth: 09-18-1950        Patient Gender: M Patient Age:   73 years Exam Location:  [facility] Procedure:      VAS US  UPPER EXTREMITY VENOUS DUPLEX Referring Phys: Chimaobi Casebolt --------------------------------------------------------------------------------  Indications: Edema, and Rule out  DVT. Comparison Study: No prior exam. Performing Technologist: Edilia Elden Appl  Examination Guidelines: A complete evaluation includes B-mode imaging, spectral Doppler, color Doppler, and power Doppler as needed of all accessible portions of each vessel. Bilateral testing is considered an integral part of a complete examination. Limited examinations for reoccurring indications may be performed as noted.  Right Findings: +----------+------------+---------+-----------+----------+--------------+ RIGHT     CompressiblePhasicitySpontaneousProperties  Summary     +----------+------------+---------+-----------+----------+--------------+ IJV           Full       Yes       Yes                             +----------+------------+---------+-----------+----------+--------------+ Subclavian    Full       Yes       Yes                             +----------+------------+---------+-----------+----------+--------------+ Axillary      None       No        No                              +----------+------------+---------+-----------+----------+--------------+ Brachial      None       No        No                              +----------+------------+---------+-----------+----------+--------------+ Radial                                              Not visualized +----------+------------+---------+-----------+----------+--------------+ Ulnar                                               Not visualized +----------+------------+---------+-----------+----------+--------------+ Cephalic      None       No        No                              +----------+------------+---------+-----------+----------+--------------+ Basilic       None       No        No                              +----------+------------+---------+-----------+----------+--------------+ Deep vein thrombosis noted in the right axillary and brachial veins. Superficial vein thrombosis noted in the  cephalic and basilic veins. The radial and ulnar veins were not visualized due to wrap around bandages in the forearm.  Left Findings: +----------+------------+---------+-----------+----------+-------+ LEFT      CompressiblePhasicitySpontaneousPropertiesSummary +----------+------------+---------+-----------+----------+-------+ IJV           Full       Yes       Yes                      +----------+------------+---------+-----------+----------+-------+ Subclavian    None       No        No                       +----------+------------+---------+-----------+----------+-------+ Axillary      None       No        No                       +----------+------------+---------+-----------+----------+-------+  Brachial      None       No        No                       +----------+------------+---------+-----------+----------+-------+ Radial        None       No        No                       +----------+------------+---------+-----------+----------+-------+ Ulnar         None       No        No                       +----------+------------+---------+-----------+----------+-------+ Cephalic      None       No        No                       +----------+------------+---------+-----------+----------+-------+ Basilic       None       No        No                       +----------+------------+---------+-----------+----------+-------+ Deep vein thrombosis noted in subclavian, axillary, brachial, radial and ulnar veins. Superficial veins noted in the cephalic and basilic veins.  Summary:  Right: Findings consistent with acute deep vein thrombosis involving the right axillary vein and right brachial veins. Findings consistent with acute superficial vein thrombosis involving the right basilic vein and right cephalic vein.  Left: Findings consistent with acute deep vein thrombosis involving the left subclavian vein, left axillary vein, left brachial veins, left radial  veins and left ulnar veins. Findings consistent with acute superficial vein thrombosis involving the left basilic vein and left cephalic vein.  *See table(s) above for measurements and observations.  Diagnosing physician: Penne Colorado MD Electronically signed by Penne Colorado MD on 07/28/2024 at 3:58:47 PM.    Final    EEG adult Result Date: 07/28/2024 Gregg Lek, MD     07/28/2024 12:21 PM Patient Name: BERTON BUTRICK MRN: [id number] Epilepsy Attending: Lek Gregg Referring Physician/Provider: No ref. provider found     Date: 07/28/2024 Duration: 24 minutes Patient history: Worsening mental status, EEG to evaluate for seizure Level of alertness: Intubated and sedated AEDs during EEG study: None Technical aspects: This EEG study was done with scalp electrodes positioned according to the 10-20 International system of electrode placement. Electrical activity was reviewed with band pass filter of 1-70Hz , sensitivity of 7 uV/mm, display speed of 12mm/sec with a 60Hz  notched filter applied as appropriate. EEG data were recorded continuously and digitally stored.  Video monitoring was available and reviewed as appropriate. Description: EEG showed continuous generalized theta slowing. Hyperventilation and photic stimulation were not performed.   ABNORMALITY - Continuous slow, generalized IMPRESSION: This study is suggestive of mild diffuse brain dysfunction such as encephalopathy, nonspecific etiology but likely related to sedation, toxic-metabolic etiology. No seizures or epileptiform discharges were seen throughout the recording. Lek Gregg MD Neurology

## 2024-07-29 NOTE — Plan of Care (Signed)
  Problem: Education: Goal: Knowledge of disease or condition will improve Outcome: Progressing Goal: Knowledge of secondary prevention will improve (MUST DOCUMENT ALL) Outcome: Progressing Goal: Knowledge of patient specific risk factors will improve (DELETE if not current risk factor) Outcome: Progressing   Problem: Intracerebral Hemorrhage Tissue Perfusion: Goal: Complications of Intracerebral Hemorrhage will be minimized Outcome: Progressing   Problem: Coping: Goal: Will verbalize positive feelings about self Outcome: Progressing Goal: Will identify appropriate support needs Outcome: Progressing   Problem: Health Behavior/Discharge Planning: Goal: Ability to manage health-related needs will improve Outcome: Progressing Goal: Goals will be collaboratively established with patient/family Outcome: Progressing   Problem: Self-Care: Goal: Ability to participate in self-care as condition permits will improve Outcome: Progressing Goal: Verbalization of feelings and concerns over difficulty with self-care will improve Outcome: Progressing Goal: Ability to communicate needs accurately will improve Outcome: Progressing   Problem: Nutrition: Goal: Risk of aspiration will decrease Outcome: Progressing Goal: Dietary intake will improve Outcome: Progressing   Problem: Education: Goal: Knowledge of General Education information will improve Description: Including pain rating scale, medication(s)/side effects and non-pharmacologic comfort measures Outcome: Progressing   Problem: Health Behavior/Discharge Planning: Goal: Ability to manage health-related needs will improve Outcome: Progressing   Problem: Clinical Measurements: Goal: Ability to maintain clinical measurements within normal limits will improve Outcome: Progressing Goal: Will remain free from infection Outcome: Progressing Goal: Diagnostic test results will improve Outcome: Progressing Goal: Respiratory  complications will improve Outcome: Progressing Goal: Cardiovascular complication will be avoided Outcome: Progressing   Problem: Activity: Goal: Risk for activity intolerance will decrease Outcome: Progressing   Problem: Nutrition: Goal: Adequate nutrition will be maintained Outcome: Progressing   Problem: Coping: Goal: Level of anxiety will decrease Outcome: Progressing   Problem: Elimination: Goal: Will not experience complications related to bowel motility Outcome: Progressing Goal: Will not experience complications related to urinary retention Outcome: Progressing   Problem: Pain Managment: Goal: General experience of comfort will improve and/or be controlled Outcome: Progressing   Problem: Safety: Goal: Ability to remain free from injury will improve Outcome: Progressing   Problem: Skin Integrity: Goal: Risk for impaired skin integrity will decrease Outcome: Progressing   Problem: Fluid Volume: Goal: Hemodynamic stability will improve Outcome: Progressing   Problem: Clinical Measurements: Goal: Diagnostic test results will improve Outcome: Progressing Goal: Signs and symptoms of infection will decrease Outcome: Progressing   Problem: Respiratory: Goal: Ability to maintain adequate ventilation will improve Outcome: Progressing   Problem: Education: Goal: Ability to describe self-care measures that may prevent or decrease complications (Diabetes Survival Skills Education) will improve Outcome: Progressing Goal: Individualized Educational Video(s) Outcome: Progressing   Problem: Coping: Goal: Ability to adjust to condition or change in health will improve Outcome: Progressing   Problem: Fluid Volume: Goal: Ability to maintain a balanced intake and output will improve Outcome: Progressing

## 2024-07-29 NOTE — Progress Notes (Signed)
 PHARMACY - ANTICOAGULATION CONSULT NOTE  Pharmacy Consult:  Heparin  Indication: BUE DVTs  Allergies  Allergen Reactions   Clonidine  Other (See Comments)    Weakness, lower extremity paralysis feeling   Penicillins Shortness Of Breath    Patient reports reaction occurred as a teenager and he experienced shortness of breath not requiring hospitalization.    Amlodipine  Swelling   Lisinopril Swelling   Metoprolol Swelling   Tylenol  [Acetaminophen ]     Patient Measurements: Height: 5' 10 (177.8 cm) Weight: 64.5 kg (142 lb 3.2 oz) IBW/kg (Calculated) : 73 HEPARIN  DW (KG): 67.4  Vital Signs: Temp: 101 F (38.3 C) (11/26 1514) Temp Source: Axillary (11/26 1514) BP: 125/62 (11/26 1900) Pulse Rate: 79 (11/26 1900)  Labs: Recent Labs    07/27/24 0620 07/28/24 0533 07/29/24 0448 07/29/24 1750  HGB 14.9 15.1  --   --   HCT 45.8 47.4  --   --   PLT 364 398  --   --   HEPARINUNFRC  --   --   --  0.23*  CREATININE 0.62 0.88 0.84  --     Estimated Creatinine Clearance: 69.3 mL/min (by C-G formula based on SCr of 0.84 mg/dL).   Medical History: Past Medical History:  Diagnosis Date   Hypertension      Assessment: 47 YOM presented with ICH and thyroid  storm.  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  Repeat CT on 11/25 showed evolution of R thalamic hemorrhage with mild edema and decreased IVH.  Pharmacy consulted to dose IV heparin  using stroke protocol.  Renal function and CBC stable.  HL 0.23- subtherapeutic   Goal of Therapy:  Heparin  level 0.3-0.5 units/ml Monitor platelets by anticoagulation protocol: Yes   Plan:  No bolus Increase IV heparin  slightly to 800 units/hr Check 8 hr heparin  level Daily heparin  level and CBC  Sharyne Glatter, PharmD, BCCCP Critical Care Clinical Pharmacist 07/29/2024 7:28 PM

## 2024-07-30 ENCOUNTER — Inpatient Hospital Stay (HOSPITAL_COMMUNITY)

## 2024-07-30 DIAGNOSIS — I610 Nontraumatic intracerebral hemorrhage in hemisphere, subcortical: Secondary | ICD-10-CM | POA: Diagnosis not present

## 2024-07-30 DIAGNOSIS — I10 Essential (primary) hypertension: Secondary | ICD-10-CM | POA: Diagnosis not present

## 2024-07-30 DIAGNOSIS — I6389 Other cerebral infarction: Secondary | ICD-10-CM | POA: Diagnosis not present

## 2024-07-30 DIAGNOSIS — I615 Nontraumatic intracerebral hemorrhage, intraventricular: Secondary | ICD-10-CM | POA: Diagnosis not present

## 2024-07-30 LAB — COMPREHENSIVE METABOLIC PANEL WITH GFR
ALT: 65 U/L — ABNORMAL HIGH (ref 0–44)
AST: 60 U/L — ABNORMAL HIGH (ref 15–41)
Albumin: 2.1 g/dL — ABNORMAL LOW (ref 3.5–5.0)
Alkaline Phosphatase: 189 U/L — ABNORMAL HIGH (ref 38–126)
Anion gap: 12 (ref 5–15)
BUN: 38 mg/dL — ABNORMAL HIGH (ref 8–23)
CO2: 29 mmol/L (ref 22–32)
Calcium: 8.4 mg/dL — ABNORMAL LOW (ref 8.9–10.3)
Chloride: 105 mmol/L (ref 98–111)
Creatinine, Ser: 0.88 mg/dL (ref 0.61–1.24)
GFR, Estimated: >60 mL/min (ref >60)
Glucose, Bld: 132 mg/dL — ABNORMAL HIGH (ref 70–99)
Potassium: 4.3 mmol/L (ref 3.5–5.1)
Sodium: 146 mmol/L — ABNORMAL HIGH (ref 135–145)
Total Bilirubin: 0.8 mg/dL (ref 0.0–1.2)
Total Protein: 6.2 g/dL — ABNORMAL LOW (ref 6.5–8.1)

## 2024-07-30 LAB — PHOSPHORUS: Phosphorus: 2.9 mg/dL (ref 2.5–4.6)

## 2024-07-30 LAB — BASIC METABOLIC PANEL WITH GFR
Anion gap: 9 (ref 5–15)
BUN: 32 mg/dL — ABNORMAL HIGH (ref 8–23)
CO2: 27 mmol/L (ref 22–32)
Calcium: 8.2 mg/dL — ABNORMAL LOW (ref 8.9–10.3)
Chloride: 107 mmol/L (ref 98–111)
Creatinine, Ser: 0.82 mg/dL (ref 0.61–1.24)
GFR, Estimated: >60 mL/min (ref >60)
Glucose, Bld: 80 mg/dL (ref 70–99)
Potassium: 4.6 mmol/L (ref 3.5–5.1)
Sodium: 143 mmol/L (ref 135–145)

## 2024-07-30 LAB — URINALYSIS, ROUTINE W REFLEX MICROSCOPIC
Bilirubin Urine: NEGATIVE
Glucose, UA: NEGATIVE mg/dL
Ketones, ur: NEGATIVE mg/dL
Nitrite: NEGATIVE
Protein, ur: NEGATIVE mg/dL
Specific Gravity, Urine: 1.013 (ref 1.005–1.030)
pH: 6 (ref 5.0–8.0)

## 2024-07-30 LAB — CBC
HCT: 46.9 % (ref 39.0–52.0)
Hemoglobin: 14.9 g/dL (ref 13.0–17.0)
MCH: 27.5 pg (ref 26.0–34.0)
MCHC: 31.8 g/dL (ref 30.0–36.0)
MCV: 86.7 fL (ref 80.0–100.0)
Platelets: 370 K/uL (ref 150–400)
RBC: 5.41 MIL/uL (ref 4.22–5.81)
RDW: 15.8 % — ABNORMAL HIGH (ref 11.5–15.5)
WBC: 11.2 K/uL — ABNORMAL HIGH (ref 4.0–10.5)
nRBC: 0 % (ref 0.0–0.2)

## 2024-07-30 LAB — GLUCOSE, CAPILLARY
Glucose-Capillary: 91 mg/dL (ref 70–99)
Glucose-Capillary: 169 mg/dL — ABNORMAL HIGH (ref 70–99)
Glucose-Capillary: 164 mg/dL — ABNORMAL HIGH (ref 70–99)
Glucose-Capillary: 175 mg/dL — ABNORMAL HIGH (ref 70–99)
Glucose-Capillary: 79 mg/dL (ref 70–99)
Glucose-Capillary: 58 mg/dL — ABNORMAL LOW (ref 70–99)
Glucose-Capillary: 61 mg/dL — ABNORMAL LOW (ref 70–99)

## 2024-07-30 LAB — MAGNESIUM
Magnesium: 1.9 mg/dL (ref 1.7–2.4)
Magnesium: 2.1 mg/dL (ref 1.7–2.4)

## 2024-07-30 LAB — BRAIN NATRIURETIC PEPTIDE: B Natriuretic Peptide: 2459.9 pg/mL — ABNORMAL HIGH (ref 0.0–100.0)

## 2024-07-30 LAB — HEPARIN LEVEL (UNFRACTIONATED)
Heparin Unfractionated: 0.35 [IU]/mL (ref 0.30–0.70)
Heparin Unfractionated: 0.42 [IU]/mL (ref 0.30–0.70)

## 2024-07-30 LAB — AMMONIA: Ammonia: 44 umol/L — ABNORMAL HIGH (ref 9–35)

## 2024-07-30 LAB — LACTIC ACID, PLASMA: Lactic Acid, Venous: 1.6 mmol/L (ref 0.5–1.9)

## 2024-07-30 LAB — PROCALCITONIN: Procalcitonin: 0.27 ng/mL

## 2024-07-30 MED ORDER — MAGNESIUM SULFATE 2 GM/50ML IV SOLN
2.0000 g | Freq: Once | INTRAVENOUS | Status: AC
  Administered 2024-07-30: 2 g via INTRAVENOUS
  Filled 2024-07-30: qty 50

## 2024-07-30 MED ORDER — POTASSIUM IODIDE (EXPECTORANT) 1 GM/ML PO SOLN
250.0000 mg | Freq: Two times a day (BID) | ORAL | Status: DC
  Administered 2024-07-30 – 2024-08-02 (×8): 250 mg
  Filled 2024-07-30 (×9): qty 0.25

## 2024-07-30 MED ORDER — ONDANSETRON HCL 4 MG/2ML IJ SOLN
4.0000 mg | Freq: Three times a day (TID) | INTRAMUSCULAR | Status: DC | PRN
  Administered 2024-07-30 – 2024-07-31 (×2): 4 mg via INTRAVENOUS
  Filled 2024-07-30 (×2): qty 2

## 2024-07-30 MED ORDER — HEPARIN (PORCINE) 25000 UT/250ML-% IV SOLN
850.0000 [IU]/h | INTRAVENOUS | Status: DC
  Administered 2024-07-30 – 2024-08-02 (×4): 800 [IU]/h via INTRAVENOUS
  Filled 2024-07-30 (×4): qty 250

## 2024-07-30 MED ORDER — LACTULOSE 10 GM/15ML PO SOLN: 30.0000 g | Freq: Two times a day (BID) | ORAL | Status: DC

## 2024-07-30 MED ORDER — DEXTROSE 50 % IV SOLN
12.5000 g | INTRAVENOUS | Status: AC
  Administered 2024-07-30: 12.5 g via INTRAVENOUS
  Filled 2024-07-30: qty 50

## 2024-07-30 NOTE — Progress Notes (Signed)
 Continued obtundation Now RN says, :1240 pt. HR dropped to 39/40 for maybe 5 seconds and then went back to norma   Pl;an Dc heparin  Get stat ct head     SIGNATURE    Dr. Dorethia Cave, M.D., F.C.C.P,  Pulmonary and Critical Care Medicine Staff Physician, [facility] Director - Interstitial Lung Disease  Program  Pulmonary Fibrosis [facility] at [facility] [place], [place], [address]   Pager: [phone], If no answer  -> Check AMION or Try [phone] Telephone (clinical office): [phone] Telephone (research): [phone]  1:23 PM 07/30/2024

## 2024-07-30 NOTE — Progress Notes (Signed)
 Pt transported from 3M05 to CT and back on vent w/o complications.

## 2024-07-30 NOTE — Progress Notes (Signed)
 PHARMACY - ANTICOAGULATION CONSULT NOTE  Pharmacy Consult:  Heparin  Indication: Bilateral UE DVTs  Allergies  Allergen Reactions   Clonidine  Other (See Comments)    Weakness, lower extremity paralysis feeling   Penicillins Shortness Of Breath    Patient reports reaction occurred as a teenager and he experienced shortness of breath not requiring hospitalization.    Amlodipine  Swelling   Lisinopril Swelling   Metoprolol Swelling   Tylenol  [Acetaminophen ]     Patient Measurements: Height: 5' 10 (177.8 cm) Weight: 65.5 kg (144 lb 6.4 oz) IBW/kg (Calculated) : 73 HEPARIN  DW (KG): 67.4  Vital Signs: Temp: 97.3 F (36.3 C) (11/27 0309) Temp Source: Axillary (11/27 0309) BP: 101/60 (11/27 0530) Pulse Rate: 75 (11/27 0530)  Labs: Recent Labs    07/27/24 0620 07/28/24 0533 07/29/24 0448 07/29/24 1750 07/30/24 0452  HGB 14.9 15.1  --   --  14.9  HCT 45.8 47.4  --   --  46.9  PLT 364 398  --   --  370  HEPARINUNFRC  --   --   --  0.23* 0.35  CREATININE 0.62 0.88 0.84  --   --     Estimated Creatinine Clearance: 70.4 mL/min (by C-G formula based on SCr of 0.84 mg/dL).   Medical History: Past Medical History:  Diagnosis Date   Hypertension      Assessment: 26 YOM presented with ICH and thyroid  storm.  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  Repeat CT on 11/25 showed evolution of R thalamic hemorrhage with mild edema and decreased IVH.  Pharmacy consulted to dose IV heparin  using stroke protocol.  Renal function and CBC stable.  11/27 AM update:  Heparin  level therapeutic  Hgb stable  Goal of Therapy:  Heparin  level 0.3-0.5 units/ml Monitor platelets by anticoagulation protocol: Yes   Plan:  Cont heparin  at 800 units/hr Check 8 hr heparin  level Daily heparin  level and CBC  Lynwood Mckusick, PharmD, BCPS Clinical Pharmacist Phone: [phone]

## 2024-07-30 NOTE — Progress Notes (Addendum)
 eLink Physician-Brief Progress Note Patient Name: Lawrence Barnett DOB: [birth year] MRN: [id number]   Date of Service  07/30/2024  HPI/Events of Note  RN notified us  that patient had a run of VT with HR in 120s?Lawrence Barnett When I saw on camera, HR was in 90s. BP is ok. RN notes he came into patient's room and found that he had vomited and had VT around that time. At present only with some PVCs. EKG done not in system but Qtc is over 500. Mag this AM was 1.9  eICU Interventions  Check bmp and mag now and let me know results EKG - noted prior ones also with multiple PVC and with prolonged Qtc better on current EKG which RN is scanning in system Is only on heparin  infusion and RN had already held tube feeds     Intervention Category Major Interventions: Arrhythmia - evaluation and management  Caris Cerveny G Griffin Gerrard 07/30/2024, 8:04 PM  950 pm - K and mag are all ok. Some intermittent A fib also noted but rate controlled. Nothing acute to do or add there since has also had some bradycardia on occasion. Vomiting not a new issue per RN, has had this during the day too so I ordered KUB which is also not showing anything acute. Will resume TF ina  couple hours. Dw RN  1215 am - Tube feeds held due to vomiting so glucose dropped and has been treated. Will add D5Ns to prevent hypoglycemia. Also soft Bps with MAP 71. Could just be dry from having TF held and vomiting. Give 500 cc NS over 1 hour  1:10 am - Fever . Asking for cooling blanket. Noted has had temps during day as well. Borderline lower Bps also during day. Cultures were sent. UA and cxr did not show infection. MRSA pcr neg on the 16th. Will add cefepime  given lower glucose, lower Bps a few times and will also repeat MRSA PCR.
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
## 2024-07-30 NOTE — Progress Notes (Signed)
 From Pharm D  Jade Denninger   CBGs were a little low overnight in the 90s and we are also coming down on the hydrocortisone  today and its the last dose. HgbA1c PTA was 5.6. Can we stop the scheduled insulin  and just do SSI? Also systolic was in the 80s overnight, can we stop the hydral and just proceed with the scheduled amlodipine ?   Plan  - stop all BP meds  - dc semaglee    SIGNATURE    Dr. Dorethia Cave, M.D., F.C.C.P,  Pulmonary and Critical Care Medicine Staff Physician, [facility] Director - Interstitial Lung Disease  Program  Pulmonary Fibrosis [facility] at [facility] [place], [place], [address]   Pager: [phone], If no answer  -> Check AMION or Try [phone] Telephone (clinical office): [phone] Telephone (research): [phone]  12:12 PM 07/30/2024

## 2024-07-30 NOTE — Plan of Care (Signed)
  Problem: Education: Goal: Knowledge of disease or condition will improve Outcome: Not Progressing Goal: Knowledge of secondary prevention will improve (MUST DOCUMENT ALL) Outcome: Not Progressing Goal: Knowledge of patient specific risk factors will improve (DELETE if not current risk factor) Outcome: Not Progressing   Problem: Intracerebral Hemorrhage Tissue Perfusion: Goal: Complications of Intracerebral Hemorrhage will be minimized Outcome: Progressing

## 2024-07-30 NOTE — Progress Notes (Signed)
 EEG complete - results pending

## 2024-07-30 NOTE — Progress Notes (Addendum)
 07/30/2024  Scan reviewed, fine to continue AC No hypertonic needed per neuro. Look for other causes of AMS Ammonia minimally up and no benefit to previous lactulose  Check EEG Consider repeat MRI in AM to help GOC discussions  Rolan Sharps MD PCCM

## 2024-07-30 NOTE — Progress Notes (Signed)
Sputum collected and sent to lab at this time °

## 2024-07-30 NOTE — TOC Progression Note (Addendum)
 Transition of Care [facility]) - Progression Note    Patient Details  Name: Lawrence Barnett MRN: [id number] Date of Birth: April 19, 1951  [facility]) CM/SW Contact  Lendia Dais, [place] Phone Number: 07/30/2024, 9:41 AM  Clinical Narrative:  Pt is pending medically appropriateness for SNF.  CSW will continue to monitor.    Expected Discharge Plan: Skilled Nursing Facility Barriers to Discharge: Continued Medical Work up               Expected Discharge Plan and Services     Post Acute Care Choice: Skilled Nursing Facility Living arrangements for the past 2 months: Single Family Home                                       Social Drivers of Health (SDOH) Interventions SDOH Screenings   Food Insecurity: No Food Insecurity (07/23/2024)  Housing: Low Risk  (07/23/2024)  Transportation Needs: No Transportation Needs (07/23/2024)  Utilities: Not At Risk (07/23/2024)  Social Connections: Moderately Integrated (07/23/2024)  Tobacco Use: Medium Risk (07/17/2024)    Readmission Risk Interventions     No data to display

## 2024-07-30 NOTE — Plan of Care (Signed)
  Problem: Clinical Measurements: Goal: Ability to maintain clinical measurements within normal limits will improve Outcome: Progressing   Problem: Elimination: Goal: Will not experience complications related to urinary retention Outcome: Progressing   Problem: Metabolic: Goal: Ability to maintain appropriate glucose levels will improve Outcome: Progressing   Problem: Education: Goal: Knowledge of disease or condition will improve Outcome: Not Progressing Goal: Knowledge of secondary prevention will improve (MUST DOCUMENT ALL) Outcome: Not Progressing Goal: Knowledge of patient specific risk factors will improve (DELETE if not current risk factor) Outcome: Not Progressing   Problem: Intracerebral Hemorrhage Tissue Perfusion: Goal: Complications of Intracerebral Hemorrhage will be minimized Outcome: Not Progressing   Problem: Coping: Goal: Will verbalize positive feelings about self Outcome: Not Progressing Goal: Will identify appropriate support needs Outcome: Not Progressing   Problem: Health Behavior/Discharge Planning: Goal: Ability to manage health-related needs will improve Outcome: Not Progressing   Problem: Self-Care: Goal: Ability to communicate needs accurately will improve Outcome: Not Progressing   Problem: Nutrition: Goal: Risk of aspiration will decrease Outcome: Not Progressing Goal: Dietary intake will improve Outcome: Not Progressing   Problem: Education: Goal: Knowledge of General Education information will improve Description: Including pain rating scale, medication(s)/side effects and non-pharmacologic comfort measures Outcome: Not Progressing   Problem: Clinical Measurements: Goal: Diagnostic test results will improve Outcome: Not Progressing Goal: Respiratory complications will improve Outcome: Not Progressing Goal: Cardiovascular complication will be avoided Outcome: Not Progressing   Problem: Activity: Goal: Risk for activity  intolerance will decrease Outcome: Not Progressing   Problem: Nutrition: Goal: Adequate nutrition will be maintained Outcome: Not Progressing   Problem: Coping: Goal: Level of anxiety will decrease Outcome: Not Progressing   Problem: Elimination: Goal: Will not experience complications related to bowel motility Outcome: Not Progressing   Problem: Pain Managment: Goal: General experience of comfort will improve and/or be controlled Outcome: Not Progressing   Problem: Skin Integrity: Goal: Risk for impaired skin integrity will decrease Outcome: Not Progressing   Problem: Fluid Volume: Goal: Hemodynamic stability will improve Outcome: Not Progressing   Problem: Respiratory: Goal: Ability to maintain adequate ventilation will improve Outcome: Not Progressing   Problem: Fluid Volume: Goal: Ability to maintain a balanced intake and output will improve Outcome: Not Progressing   Problem: Nutritional: Goal: Maintenance of adequate nutrition will improve Outcome: Not Progressing   Problem: Skin Integrity: Goal: Risk for impaired skin integrity will decrease Outcome: Not Progressing   Problem: Tissue Perfusion: Goal: Adequacy of tissue perfusion will improve Outcome: Not Progressing

## 2024-07-30 NOTE — Progress Notes (Signed)
 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 Pt was progressing thru his hospitalization with a superimposed infection 2/2 suspected aspiration pneumonia being empirically treated.    Pt's mental status declined yesterday evening prompting overnight evaluation via RRT. Pt was reportedly found obtunded, tachycardic and tachypneic. VSS however. Abx were broadened, pt was given fluids, and stat cth revealing mildly increased ventricular size concerning for mildly progressive hydrocephalus. Neuro was reached out to who recommended repeat cth to eval progression further at 0800.    Pt's labs at 0200 this am revealed multiple areas for improvement that could contribute to pt's mental status as well. Thru the course of the day however it appears that pt's mental status did not improve, bicarb/renal function/ammonia/lactate/sodium have been worsening as well. Cth has yet to be complete.    Ccm was consulted 11/16 PM as pt remains obtunded and req ICU transfer.  Intubated, on peripheral pressors, multiple abnormal labs  Pertinent  Medical History  Htn  Significant Hospital Events: Including procedures, antibiotic start and stop dates in addition to other pertinent events   11/11 admitted to ICU  11/13 transferred to floor  11/16 CCM consulted and transferred back to ICU 11/16, intubated, on peripheral pressors 11/17-did tolerate, mental status data remain poor 11/18-tolerated weaning pressor support Tolerating weaning, remains encephalopathic11/21 11/22 remains encephalopathic, on full vent support 11/23 encephalopathic - Has remained off sedation . No significant overnight events. Not responsive to  commands  07/27/24 - afebrile . On vent, fio2 30%. On Tube Feed. Niece Randine at bedside. 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 Says patient was never married and no kids. Live in [place] and moved to [place] 5y ago to be near sister . Now sister is in a facility. No overnight issues. They understand trach is an option in their care but reports no decision made TSHJ < 0.1 (same as 07/19/24) FT4 normalized 0.93 (was 2.3 and high 07/19/24)  07/28/24: Did PSV. 30% fio2 on vent. On TF. Afebrile.  Per RN he has followed commands and is awake  with just weakness on right side. Per niece at tbedside -mental status is waxing and waning. Niece is not ready to decide on trach UE DVT - > CT head with natural progresson of bleed -> Niece not decided yet on IV heparin  but rcommended by stroke team Thyroid  meds: reduced Hydrocort  and Inderal  dosing EEG - metabolic encephaloathy 07/29/24 - remains on vent. 40%. Afebrile v 40F . Per RN followed commands yesterday but this morning remains comatose though did have gag (both to RN , niece and MD). Niece gave verbal consent for IV heparin . Niece not readyy for trach  IV Heparing gtt started for UE DVT still on lasix , looks dry but for UE edema which is DVT. CXR is dry ThyroidL: INderal  stopped and HC reduced OBtunded but has gag iPAL with niece - undecided on code status and trach   Interim History / Subjective:   07/30/24: new fever now but wbc better. PEr RN - obtunded and might withdraw to deep pain on RUE and LLE -very minimal  - Has foley  - Has only 1 PIV  Objective    Blood  pressure 104/64, pulse 89, temperature (!) 100.8 F (38.2 C), temperature source Axillary, resp. rate (!) 25, height 5' 10 (1.778 m), weight 65.5 kg, SpO2 100%.    Vent Mode: PRVC FiO2 (%):  [40 %] 40 % Set Rate:  [16 bmp] 16 bmp Vt Set:  [440 mL] 440 mL PEEP:  [5 cmH20] 5 cmH20 Pressure Support:  [5 cmH20] 5 cmH20 Plateau Pressure:  [12 cmH20-22 cmH20]  22 cmH20   Intake/Output Summary (Last 24 hours) at 07/30/2024 0746 Last data filed at 07/30/2024 0715 Gross per 24 hour  Intake 3475.38 ml  Output 2375 ml  Net 1100.38 ml   Filed Weights   07/28/24 0500 07/29/24 0500 07/30/24 0500  Weight: 64.9 kg 64.5 kg 65.5 kg    Examination:  General Appearance:  Looks criticall ill OBESE - no Head:  Normocephalic, without obvious abnormality, atraumatic Eyes:  PERRL - yes, conjunctiva/corneas - middu     Ears:  Normal external ear canals, both ears Nose:  G tube - yes Throat:  ETT TUBE - yes , OG tube - no Neck:  Supple,  No enlargement/tenderness/nodules Lungs: Clear to auscultation bilaterally, Ventilator   Synchrony - yes 405% Heart:  S1 and S2 normal, no murmur, CVP - no.  Pressors - no Abdomen:  Soft, no masses, no organomegaly Genitalia / Rectal:  Not done Extremities:  Extremities- intact. Heel in boots. Has DTI in heel per RN Skin:  ntact in exposed areas . Sacral area - no decub per Rn Neurologic:  Sedation - none -> RASS - -4 . Moves all 4s - no. CAM-ICU - x . Orientation - no         Resolved problem list   Assessment and Plan   Intraparenchymal hemorrhage with IVH and hydrocephalus Multiple watershed strokes - CT 11/25 with improved hge  11/27 - Patient remains encephalopathic/obtunded. RASS -4/-5 with gag +This is despite Na improve. EEG with toxic metabolich encephalopathy. Ammonia 44. Off lactulose  since 07/29/24  PLAN - Neuroprotective measures- normothermia, euglycemia, HOB greater than 30, head in neutral alignment, normocapnia, normoxia -Neurology continues to follow - monitor without LACTULOSE  - repeat CT head if coma continues (esp with IV heparin  gtt on board since 07/29/24)  Acute Respiratory Failure - intubated 07/19/24  07/30/2024 - > does no meet criteria for SBT/Extubation in setting of Acute Respiratory Failure due to COMA   Plan - No extubation till more awake - ongoing trach/ltac  conversations - niece 07/29/24 feels that patient needs more time in ICU with/without EET tube before any decision on trach is made (recent example of her mom who survived sepss)   Hypernatremia  - 07/30/24 - Na some better but encephalopathy continues.   Plan -  free water  to Q4h ->to ciontinue - stop lasix  since 07/29/24   Edema UE despite lasix  - noted 07/28/24 UE DVT Left side diagnosed 07/28/24  11/27 - tolerating IV heparin  gtt. No overt bleeding.  Plan  IV heparin  gtt from 07/29/24 (post niece consent)  Acute kidney injury - Stabilized  Plan  - montiro off lasix   Hyperthyroidism -On methimazole , propranolol , steroids; - stopped inderal  07/29/24 - reduced HC 07/30/24  11/25: -T4 better; TSH will take a while to improve   Plan  -  hydrocort  at lower dose - monitor off inderal  since 11/26/254 - continue methimazole   - continue Potassium Iodide  but rduce dose 07/29/24  Nutrition - Continue tube feeds  Maxillary sinusitis; last dose 07/28/24 AM Concern for sepsis again 07/30/24  Plan  - monitor -check blood culture - check UA - check resp culture  - check Procal/lacate -> if abnorma, start empric abx - check cxr 07/31/24 Anti-infectives (From admission, onward)    Start     Dose/Rate Route Frequency Ordered Stop   07/23/24 1800  doxycycline  (VIBRAMYCIN ) 100 mg in sodium chloride  0.9 % 250 mL IVPB        100 mg 125 mL/hr over 120 Minutes Intravenous Every 12 hours 07/23/24 1109 07/27/24 1927   07/23/24 1200  doxycycline  (VIBRAMYCIN ) 100 mg in sodium chloride  0.9 % 250 mL IVPB  Status:  Discontinued        100 mg 125 mL/hr over 120 Minutes Intravenous Every 12 hours 07/23/24 1106 07/23/24 1109   07/22/24 1000  ceFEPIme  (MAXIPIME ) 2 g in sodium chloride  0.9 % 100 mL IVPB  Status:  Discontinued        2 g 200 mL/hr over 30 Minutes Intravenous Every 12 hours 07/22/24 0854 07/23/24 1106   07/20/24 2200  ceFEPIme  (MAXIPIME ) 2 g in sodium chloride   0.9 % 100 mL IVPB  Status:  Discontinued        2 g 200 mL/hr over 30 Minutes Intravenous Every 24 hours 07/20/24 1044 07/22/24 0854   07/19/24 2200  linezolid  (ZYVOX ) tablet 600 mg  Status:  Discontinued        600 mg Per Tube Every 12 hours 07/19/24 1030 07/20/24 0812   07/18/24 2330  vancomycin  (VANCOREADY) IVPB 1250 mg/250 mL        1,250 mg 166.7 mL/hr over 90 Minutes Intravenous  Once 07/18/24 2235 07/19/24 0209   07/18/24 2330  ceFEPIme  (MAXIPIME ) 2 g in sodium chloride  0.9 % 100 mL IVPB  Status:  Discontinued        2 g 200 mL/hr over 30 Minutes Intravenous Every 12 hours 07/18/24 2235 07/20/24 1044   07/18/24 2245  metroNIDAZOLE  (FLAGYL ) tablet 500 mg  Status:  Discontinued        500 mg Per Tube Every 12 hours 07/18/24 2154 07/20/24 0812   07/18/24 2234  vancomycin  variable dose per unstable renal function (pharmacist dosing)  Status:  Discontinued         Does not apply See admin instructions 07/18/24 2235 07/19/24 1030   07/18/24 1000  metroNIDAZOLE  (FLAGYL ) tablet 500 mg  Status:  Discontinued        500 mg Oral Every 12 hours 07/18/24 0820 07/18/24 2154   07/18/24 0915  cefTRIAXone  (ROCEPHIN ) 2 g in sodium chloride  0.9 % 100 mL IVPB  Status:  Discontinued        2 g 200 mL/hr over 30 Minutes Intravenous Every 24 hours 07/18/24 0820 07/18/24 2137         Hypertension - Controlled  Plan  - monitor off inderal   FAMILY  Updated niece at bedside 07/24/2024: CCM  start discussing medium term program plans which may mean you will need a tracheostomy and a PEG, will take it a day at a time   11/24 - Niece Randine updated at bedside 07/28/24 [place] Randine updated. Goals of care in progress 07/29/24 - iPAL done 11/27 - none at bedside   ATTESTATION & SIGNATURE   The patient KEILON RESSEL is critically ill with multiple organ systems failure and requires high complexity decision making for assessment and support, frequent evaluation and titration of therapies, application of  advanced monitoring technologies and extensive interpretation of multiple databases and discussion with other appropriate health care personnel such  as bedside nurses, social workers, sports coach, pharmacologist, respiratory therapists, nutritionists, secretaries etc.,  Critical care time includes but is not restricted to just documentation time. Documentation can happen in parallel or sequential to care time depending on case mix urgency and priorities for the shift. So, overall critical Care Time devoted to patient care services described in this note is  30  Minutes.   This time reflects time of care of this signee Dr Dorethia Cave which includ does not reflect procedure time, or teaching time or supervisory time of PA/NP/Med student/Med Resident etc but could involve care discussion time     Dr. Dorethia Cave, M.D., [facility].C.P Pulmonary and Critical Care Medicine Staff Physician, [facility] [place] [facility] Pager: [phone], If no answer or between  15:00h - 7:00h: call [phone]  07/30/2024 8:05 AM    LABS    PULMONARY Recent Labs  Lab 07/24/24 1217  PHART 7.507*  PCO2ART 36.3  PO2ART 180*  HCO3 28.8*  TCO2 30  O2SAT 100    CBC Recent Labs  Lab 07/27/24 0620 07/28/24 0533 07/30/24 0452  HGB 14.9 15.1 14.9  HCT 45.8 47.4 46.9  WBC 14.8* 14.8* 11.2*  PLT 364 398 370    COAGULATION No results for input(s): INR in the last 168 hours.  CARDIAC  No results for input(s): TROPONINI in the last 168 hours. No results for input(s): PROBNP in the last 168 hours.   CHEMISTRY Recent Labs  Lab 07/25/24 0143 07/26/24 0055 07/27/24 0620 07/28/24 0533 07/29/24 0448 07/30/24 0452  NA 150* 145 151* 146* 148* 146*  K 3.9 3.8 4.0 3.9 3.6 4.3  CL 114* 109 109 109 107 105  CO2 24 28 25 25 29 29   GLUCOSE 238* 223* 269* 233* 136* 132*  BUN 42* 38* 34* 37* 40* 38*  CREATININE 1.03 0.88 0.62 0.88 0.84 0.88  CALCIUM 8.9 8.9 9.1  8.6* 8.8* 8.4*  MG 1.9  --  1.8 2.1  --  1.9  PHOS  --   --   --  2.5  --  2.9   Estimated Creatinine Clearance: 67.2 mL/min (by C-G formula based on SCr of 0.88 mg/dL).   LIVER Recent Labs  Lab 07/28/24 0533 07/30/24 0452  AST 51* 60*  ALT 70* 65*  ALKPHOS 177* 189*  BILITOT 0.7 0.8  PROT 6.3* 6.2*  ALBUMIN 2.1* 2.1*     INFECTIOUS No results for input(s): LATICACIDVEN, PROCALCITON in the last 168 hours.    ENDOCRINE CBG (last 3)  Recent Labs    07/29/24 2326 07/30/24 0308 07/30/24 0713  GLUCAP 168* 91 169*         IMAGING x48h  - image(s) personally visualized  -   highlighted in bold VAS US  LOWER EXTREMITY VENOUS (DVT) Result Date: 07/29/2024  Lower Venous DVT Study Patient Name:  CHLOE BAIG  Date of Exam:   07/29/2024 Medical Rec #: [id number]        Accession #:    [id number] Date of Birth: 1951-07-19        Patient Gender: M Patient Age:   51 years Exam Location:  [facility] Procedure:      VAS US  LOWER EXTREMITY VENOUS (DVT) Referring Phys: DORETHIA CAVE --------------------------------------------------------------------------------  Indications: Edema, and H/O lower extremity DVT.  Comparison Study: No prior exam. Performing Technologist: Edilia Elden Appl  Examination Guidelines: A complete evaluation includes B-mode imaging, spectral Doppler, color Doppler, and power Doppler  as needed of all accessible portions of each vessel. Bilateral testing is considered an integral part of a complete examination. Limited examinations for reoccurring indications may be performed as noted. The reflux portion of the exam is performed with the patient in reverse Trendelenburg.  +---------+---------------+---------+-----------+----------+--------------+ RIGHT    CompressibilityPhasicitySpontaneityPropertiesThrombus Aging +---------+---------------+---------+-----------+----------+--------------+ CFV      Full           Yes      Yes                                  +---------+---------------+---------+-----------+----------+--------------+ SFJ      Full           Yes      Yes                                 +---------+---------------+---------+-----------+----------+--------------+ FV Prox  Full                                                        +---------+---------------+---------+-----------+----------+--------------+ FV Mid   Partial        Yes      Yes                                 +---------+---------------+---------+-----------+----------+--------------+ FV DistalPartial        Yes      Yes                                 +---------+---------------+---------+-----------+----------+--------------+ PFV      Full                                                        +---------+---------------+---------+-----------+----------+--------------+ POP      Partial        Yes      Yes                                 +---------+---------------+---------+-----------+----------+--------------+ PTV      Full                                                        +---------+---------------+---------+-----------+----------+--------------+ PERO     Full                                                        +---------+---------------+---------+-----------+----------+--------------+ Non-occlusive deep vein thrombosis noted in the middle and distal femoral vein and in the popliteal vein.  +---------+---------------+---------+-----------+----------+--------------+ LEFT     CompressibilityPhasicitySpontaneityPropertiesThrombus Aging +---------+---------------+---------+-----------+----------+--------------+ CFV  Full           Yes      Yes                                 +---------+---------------+---------+-----------+----------+--------------+ SFJ      Full           Yes      Yes                                 +---------+---------------+---------+-----------+----------+--------------+  FV Prox  Full                                                        +---------+---------------+---------+-----------+----------+--------------+ FV Mid   Full                                                        +---------+---------------+---------+-----------+----------+--------------+ FV DistalFull                                                        +---------+---------------+---------+-----------+----------+--------------+ PFV      None           No       No                                  +---------+---------------+---------+-----------+----------+--------------+ POP      Full           Yes      Yes                                 +---------+---------------+---------+-----------+----------+--------------+ PTV      Full                                                        +---------+---------------+---------+-----------+----------+--------------+ PERO     Full                                                        +---------+---------------+---------+-----------+----------+--------------+ Deep vein thrombosis noted in the profunda vein.    Summary: RIGHT: - Findings consistent with age indeterminate deep vein thrombosis involving the right femoral vein, and right popliteal vein.  - No cystic structure found in the popliteal fossa.  LEFT: - Findings consistent with age indeterminate deep vein thrombosis involving the left proximal profunda vein.  - No cystic structure found in the popliteal fossa.  *See table(s)  above for measurements and observations. Electronically signed by Lonni Gaskins MD on 07/29/2024 at 1:36:19 PM.    Final    DG CHEST PORT 1 VIEW Result Date: 07/29/2024 EXAM: 1 VIEW(S) XRAY OF THE CHEST 07/29/2024 05:22:00 AM COMPARISON: Portable chest 07/19/2024. CLINICAL HISTORY: Endotracheal tube present. FINDINGS: LINES, TUBES AND DEVICES: Endotracheal tube is in place with its tip 4.9 cm from the carina. The feeding tube passes well into  the stomach; however, its radiopaque tip is out of view. LUNGS AND PLEURA: Chronic asymmetric right apical pleuroparenchymal scarring. The lungs are clear of infiltrates. No significant pleural effusion. No pneumothorax. HEART AND MEDIASTINUM: Aortic atherosclerosis and tortuosity with stable mediastinum. The cardiac size is normal. BONES AND SOFT TISSUES: No acute osseous abnormality. IMPRESSION: 1. Endotracheal tube in place with tip 4.9 cm from the carina. 2. No acute cardiopulmonary findings. Electronically signed by: Francis Quam MD 07/29/2024 06:28 AM EST RP Workstation: [id number]   CT HEAD WO CONTRAST ( ) Result Date: 07/28/2024 EXAM: CT HEAD WITHOUT CONTRAST 07/28/2024 04:35:00 PM TECHNIQUE: CT of the head was performed without the administration of intravenous contrast. Automated exposure control, iterative reconstruction, and/or weight based adjustment of the mA/kV was utilized to reduce the radiation dose to as low as reasonably achievable. COMPARISON: Head CT 07/19/2024 and MRI 07/22/2024. CLINICAL HISTORY: Stroke, hemorrhagic. FINDINGS: BRAIN AND VENTRICLES: An evolving hemorrhage centered in the right thalamus demonstrates mildly decreasing density compared to the prior CT. The residual hyperdense hemorrhage measures 3.1 x 2.1 x 1.6 cm. There is persistent mild surrounding edema and mild mass effect. Small volume intraventricular hemorrhage on the prior studies has decreased, with trace residual hemorrhage layering in the lateral ventricles. The ventricles are unchanged in size. A 1 cm hypodensity in the posterior right corona radiata corresponds to an acute infarct on the prior MRI, however the vast majority of the acute cerebral and cerebellar infarcts on MRI are occult by CT. Patchy to confluent hypodensities elsewhere in the cerebral white matter bilaterally are nonspecific but compatible with extensive chronic small vessel ischemic disease. There is mild cerebral atrophy. No acute large  territory infarct, new intracranial hemorrhage, significant midline shift, or extra-axial fluid collection is identified. ORBITS: No acute abnormality. SINUSES: Mild mucosal thickening in the bilateral ethmoid and right maxillary sinuses. Persistent near complete opacification of the left maxillary sinus. Clear mastoid air cells. SOFT TISSUES AND SKULL: No acute soft tissue abnormality. No skull fracture. IMPRESSION: 1. Continued evolution of a right thalamic hemorrhage with persistent mild edema. Decreased intraventricular hemorrhage. 2. Extensive chronic small vessel ischemic disease. The numerous small acute infarcts on the recent MRI are largely occult by CT. Electronically signed by: Dasie Hamburg MD 07/28/2024 04:48 PM EST RP Workstation: [id number]   VAS US  UPPER EXTREMITY VENOUS DUPLEX Result Date: 07/28/2024 UPPER VENOUS STUDY  Patient Name:  DAMOND BORCHERS  Date of Exam:   07/28/2024 Medical Rec #: [id number]        Accession #:    [id number] Date of Birth: 1950-09-10        Patient Gender: M Patient Age:   3 years Exam Location:  [facility] Procedure:      VAS US  UPPER EXTREMITY VENOUS DUPLEX Referring Phys: Devonda Pequignot --------------------------------------------------------------------------------  Indications: Edema, and Rule out DVT. Comparison Study: No prior exam. Performing Technologist: Edilia Elden Appl  Examination Guidelines: A complete evaluation includes B-mode imaging, spectral Doppler, color Doppler, and power Doppler as needed of all accessible portions of each vessel. Bilateral testing is considered  an integral part of a complete examination. Limited examinations for reoccurring indications may be performed as noted.  Right Findings: +----------+------------+---------+-----------+----------+--------------+ RIGHT     CompressiblePhasicitySpontaneousProperties   Summary     +----------+------------+---------+-----------+----------+--------------+ IJV            Full       Yes       Yes                             +----------+------------+---------+-----------+----------+--------------+ Subclavian    Full       Yes       Yes                             +----------+------------+---------+-----------+----------+--------------+ Axillary      None       No        No                              +----------+------------+---------+-----------+----------+--------------+ Brachial      None       No        No                              +----------+------------+---------+-----------+----------+--------------+ Radial                                              Not visualized +----------+------------+---------+-----------+----------+--------------+ Ulnar                                               Not visualized +----------+------------+---------+-----------+----------+--------------+ Cephalic      None       No        No                              +----------+------------+---------+-----------+----------+--------------+ Basilic       None       No        No                              +----------+------------+---------+-----------+----------+--------------+ Deep vein thrombosis noted in the right axillary and brachial veins. Superficial vein thrombosis noted in the cephalic and basilic veins. The radial and ulnar veins were not visualized due to wrap around bandages in the forearm.  Left Findings: +----------+------------+---------+-----------+----------+-------+ LEFT      CompressiblePhasicitySpontaneousPropertiesSummary +----------+------------+---------+-----------+----------+-------+ IJV           Full       Yes       Yes                      +----------+------------+---------+-----------+----------+-------+ Subclavian    None       No        No                       +----------+------------+---------+-----------+----------+-------+ Axillary      None  No        No                        +----------+------------+---------+-----------+----------+-------+ Brachial      None       No        No                       +----------+------------+---------+-----------+----------+-------+ Radial        None       No        No                       +----------+------------+---------+-----------+----------+-------+ Ulnar         None       No        No                       +----------+------------+---------+-----------+----------+-------+ Cephalic      None       No        No                       +----------+------------+---------+-----------+----------+-------+ Basilic       None       No        No                       +----------+------------+---------+-----------+----------+-------+ Deep vein thrombosis noted in subclavian, axillary, brachial, radial and ulnar veins. Superficial veins noted in the cephalic and basilic veins.  Summary:  Right: Findings consistent with acute deep vein thrombosis involving the right axillary vein and right brachial veins. Findings consistent with acute superficial vein thrombosis involving the right basilic vein and right cephalic vein.  Left: Findings consistent with acute deep vein thrombosis involving the left subclavian vein, left axillary vein, left brachial veins, left radial veins and left ulnar veins. Findings consistent with acute superficial vein thrombosis involving the left basilic vein and left cephalic vein.  *See table(s) above for measurements and observations.  Diagnosing physician: Penne Colorado MD Electronically signed by Penne Colorado MD on 07/28/2024 at 3:58:47 PM.    Final    EEG adult Result Date: 07/28/2024 Gregg Lek, MD     07/28/2024 12:21 PM Patient Name: KHYRIE MASI MRN: [id number] Epilepsy Attending: Lek Gregg Referring Physician/Provider: No ref. provider found     Date: 07/28/2024 Duration: 24 minutes Patient history: Worsening mental status, EEG to evaluate for seizure Level of alertness:  Intubated and sedated AEDs during EEG study: None Technical aspects: This EEG study was done with scalp electrodes positioned according to the 10-20 International system of electrode placement. Electrical activity was reviewed with band pass filter of 1-70Hz , sensitivity of 7 uV/mm, display speed of 48mm/sec with a 60Hz  notched filter applied as appropriate. EEG data were recorded continuously and digitally stored.  Video monitoring was available and reviewed as appropriate. Description: EEG showed continuous generalized theta slowing. Hyperventilation and photic stimulation were not performed.   ABNORMALITY - Continuous slow, generalized IMPRESSION: This study is suggestive of mild diffuse brain dysfunction such as encephalopathy, nonspecific etiology but likely related to sedation, toxic-metabolic etiology. No seizures or epileptiform discharges were seen throughout the recording. Lek Gregg MD Neurology

## 2024-07-31 ENCOUNTER — Inpatient Hospital Stay (HOSPITAL_COMMUNITY)

## 2024-07-31 DIAGNOSIS — I615 Nontraumatic intracerebral hemorrhage, intraventricular: Secondary | ICD-10-CM | POA: Diagnosis not present

## 2024-07-31 DIAGNOSIS — I10 Essential (primary) hypertension: Secondary | ICD-10-CM | POA: Diagnosis not present

## 2024-07-31 DIAGNOSIS — I610 Nontraumatic intracerebral hemorrhage in hemisphere, subcortical: Secondary | ICD-10-CM | POA: Diagnosis not present

## 2024-07-31 DIAGNOSIS — I6389 Other cerebral infarction: Secondary | ICD-10-CM | POA: Diagnosis not present

## 2024-07-31 LAB — CBC
HCT: 51.7 % (ref 39.0–52.0)
Hemoglobin: 16.2 g/dL (ref 13.0–17.0)
MCH: 28.1 pg (ref 26.0–34.0)
MCHC: 31.3 g/dL (ref 30.0–36.0)
MCV: 89.6 fL (ref 80.0–100.0)
Platelets: 320 K/uL (ref 150–400)
RBC: 5.77 MIL/uL (ref 4.22–5.81)
RDW: 16.1 % — ABNORMAL HIGH (ref 11.5–15.5)
WBC: 14.7 K/uL — ABNORMAL HIGH (ref 4.0–10.5)
nRBC: 0 % (ref 0.0–0.2)

## 2024-07-31 LAB — GLUCOSE, CAPILLARY
Glucose-Capillary: 108 mg/dL — ABNORMAL HIGH (ref 70–99)
Glucose-Capillary: 117 mg/dL — ABNORMAL HIGH (ref 70–99)
Glucose-Capillary: 189 mg/dL — ABNORMAL HIGH (ref 70–99)
Glucose-Capillary: 53 mg/dL — ABNORMAL LOW (ref 70–99)
Glucose-Capillary: 71 mg/dL (ref 70–99)
Glucose-Capillary: 82 mg/dL (ref 70–99)
Glucose-Capillary: 91 mg/dL (ref 70–99)
Glucose-Capillary: 97 mg/dL (ref 70–99)
Glucose-Capillary: 98 mg/dL (ref 70–99)

## 2024-07-31 LAB — LACTIC ACID, PLASMA
Lactic Acid, Venous: 5.2 mmol/L (ref 0.5–1.9)
Lactic Acid, Venous: 1.2 mmol/L (ref 0.5–1.9)

## 2024-07-31 LAB — BASIC METABOLIC PANEL WITH GFR
Anion gap: 15 (ref 5–15)
BUN: 32 mg/dL — ABNORMAL HIGH (ref 8–23)
CO2: 25 mmol/L (ref 22–32)
Calcium: 8.1 mg/dL — ABNORMAL LOW (ref 8.9–10.3)
Chloride: 106 mmol/L (ref 98–111)
Creatinine, Ser: 0.93 mg/dL (ref 0.61–1.24)
GFR, Estimated: >60 mL/min (ref >60)
Glucose, Bld: 100 mg/dL — ABNORMAL HIGH (ref 70–99)
Potassium: 5 mmol/L (ref 3.5–5.1)
Sodium: 146 mmol/L — ABNORMAL HIGH (ref 135–145)

## 2024-07-31 LAB — COMPREHENSIVE METABOLIC PANEL WITH GFR
ALT: 62 U/L — ABNORMAL HIGH (ref 0–44)
AST: 67 U/L — ABNORMAL HIGH (ref 15–41)
Albumin: 2.2 g/dL — ABNORMAL LOW (ref 3.5–5.0)
Alkaline Phosphatase: 223 U/L — ABNORMAL HIGH (ref 38–126)
Anion gap: 14 (ref 5–15)
BUN: 33 mg/dL — ABNORMAL HIGH (ref 8–23)
CO2: 24 mmol/L (ref 22–32)
Calcium: 8.1 mg/dL — ABNORMAL LOW (ref 8.9–10.3)
Chloride: 107 mmol/L (ref 98–111)
Creatinine, Ser: 0.95 mg/dL (ref 0.61–1.24)
GFR, Estimated: >60 mL/min (ref >60)
Glucose, Bld: 71 mg/dL (ref 70–99)
Potassium: 5.5 mmol/L — ABNORMAL HIGH (ref 3.5–5.1)
Sodium: 145 mmol/L (ref 135–145)
Total Bilirubin: 1.8 mg/dL — ABNORMAL HIGH (ref 0.0–1.2)
Total Protein: 6.3 g/dL — ABNORMAL LOW (ref 6.5–8.1)

## 2024-07-31 LAB — PROCALCITONIN: Procalcitonin: 2.5 ng/mL

## 2024-07-31 LAB — AMYLASE: Amylase: 158 U/L — ABNORMAL HIGH (ref 28–100)

## 2024-07-31 LAB — TSH: TSH: <0.1 u[IU]/mL — ABNORMAL LOW (ref 0.350–4.500)

## 2024-07-31 LAB — LIPASE, BLOOD: Lipase: 86 U/L — ABNORMAL HIGH (ref 11–51)

## 2024-07-31 LAB — HEPARIN LEVEL (UNFRACTIONATED): Heparin Unfractionated: 0.47 [IU]/mL (ref 0.30–0.70)

## 2024-07-31 LAB — MAGNESIUM: Magnesium: 2.3 mg/dL (ref 1.7–2.4)

## 2024-07-31 LAB — MRSA NEXT GEN BY PCR, NASAL: MRSA by PCR Next Gen: NOT DETECTED

## 2024-07-31 LAB — T4, FREE: Free T4: 0.67 ng/dL (ref 0.61–1.12)

## 2024-07-31 LAB — TROPONIN I (HIGH SENSITIVITY): Troponin I (High Sensitivity): 282 ng/L (ref <18)

## 2024-07-31 LAB — PHOSPHORUS: Phosphorus: 4.2 mg/dL (ref 2.5–4.6)

## 2024-07-31 MED ORDER — LACTATED RINGERS IV BOLUS
1000.0000 mL | Freq: Once | INTRAVENOUS | Status: AC
  Administered 2024-07-31: 1000 mL via INTRAVENOUS

## 2024-07-31 MED ORDER — DEXTROSE-SODIUM CHLORIDE 5-0.9 % IV SOLN: INTRAVENOUS | Status: AC

## 2024-07-31 MED ORDER — INSULIN ASPART 100 UNIT/ML IJ SOLN
0.0000 [IU] | INTRAMUSCULAR | Status: DC
  Administered 2024-07-31 – 2024-08-01 (×2): 2 [IU] via SUBCUTANEOUS
  Administered 2024-08-01 – 2024-08-02 (×5): 1 [IU] via SUBCUTANEOUS
  Administered 2024-08-02: 2 [IU] via SUBCUTANEOUS
  Filled 2024-07-31 (×4): qty 1

## 2024-07-31 MED ORDER — SODIUM CHLORIDE 0.9 % IV SOLN
250.0000 mL | INTRAVENOUS | Status: AC
  Administered 2024-07-31: 250 mL via INTRAVENOUS

## 2024-07-31 MED ORDER — SODIUM ZIRCONIUM CYCLOSILICATE 5 G PO PACK
5.0000 g | PACK | Freq: Once | ORAL | Status: AC
  Administered 2024-07-31: 5 g via ORAL
  Filled 2024-07-31: qty 1

## 2024-07-31 MED ORDER — DEXTROSE 50 % IV SOLN
INTRAVENOUS | Status: AC
  Administered 2024-07-31: 25 g via INTRAVENOUS
  Filled 2024-07-31: qty 50

## 2024-07-31 MED ORDER — SODIUM CHLORIDE 0.9 % IV SOLN
2.0000 g | Freq: Three times a day (TID) | INTRAVENOUS | Status: DC
  Administered 2024-07-31 (×2): 2 g via INTRAVENOUS
  Filled 2024-07-31 (×2): qty 12.5

## 2024-07-31 MED ORDER — DIPHENHYDRAMINE HCL 50 MG/ML IJ SOLN
25.0000 mg | Freq: Once | INTRAMUSCULAR | Status: AC
  Administered 2024-07-31: 25 mg via INTRAVENOUS
  Filled 2024-07-31: qty 1

## 2024-07-31 MED ORDER — SODIUM CHLORIDE 0.9 % IV SOLN
1.0000 g | Freq: Three times a day (TID) | INTRAVENOUS | Status: DC
  Administered 2024-07-31 – 2024-08-02 (×5): 1 g via INTRAVENOUS
  Filled 2024-07-31 (×5): qty 20

## 2024-07-31 MED ORDER — SODIUM CHLORIDE 0.9 % IV BOLUS
500.0000 mL | Freq: Once | INTRAVENOUS | Status: AC
  Administered 2024-07-31: 500 mL via INTRAVENOUS

## 2024-07-31 MED ORDER — HYDROCORTISONE SOD SUC (PF) 100 MG IJ SOLR
100.0000 mg | Freq: Three times a day (TID) | INTRAMUSCULAR | Status: DC
  Administered 2024-07-31 – 2024-08-01 (×3): 100 mg via INTRAVENOUS
  Filled 2024-07-31 (×3): qty 2

## 2024-07-31 MED ORDER — NOREPINEPHRINE 4 MG/250ML-% IV SOLN
0.0000 ug/min | INTRAVENOUS | Status: DC
  Administered 2024-07-31: 2.5 ug/min via INTRAVENOUS
  Filled 2024-07-31: qty 250

## 2024-07-31 MED ORDER — DEXTROSE 50 % IV SOLN: 25.0000 g | INTRAVENOUS | Status: AC

## 2024-07-31 MED ORDER — COLLAGENASE 250 UNIT/GM EX OINT
TOPICAL_OINTMENT | Freq: Every day | CUTANEOUS | Status: DC
  Filled 2024-07-31: qty 30

## 2024-07-31 NOTE — Progress Notes (Signed)
 Patient's  ETTn advanced 1 cm per CCM MD order.   07/31/24 0912  Airway 7.5 mm  Placement Date/Time: 07/19/24 2249   Placed By: ICU physician  Airway Device: Endotracheal Tube  ETT Types: Oral  Size (mm): 7.5 mm  Cuffed: Cuffed  Secured at (cm): 24 cm  Measured From: Lips  Secured at (cm) 26 cm (advanced 1 cm per MD order)

## 2024-07-31 NOTE — Consult Note (Addendum)
 WOC Nurse Consult Note: Reason for Consult: sacral wound  Wound type:  1  Deep Tissue Pressure Injury sacrum  evolving to unstageable with dark eschar forming and tan necrotic tissue;   2.  Deep Tissue Pressure Injury B lower buttocks/ischium purple maroon discoloration  2.  L heel deep tissue pressure injury purple maroon discoloration  Pressure Injury POA: no admitted 11/11; photo of L heel 11/21 and consult/photo sacrum 11/28  Measurement:see nursing flowsheet  Wound bed: as above  Drainage (amount, consistency, odor) see nursing flowsheet  Periwound: erythema noted to buttocks/sacral wound  Dressing procedure/placement/frequency:  Cleanse sacrum/upper buttocks wounds with Vashe, do not rinse.  Apply Apply 1/4 thick layer of Santyl to wound bed daily, top with saline moist gauze, dry gauze and silicone foam or ABD pad and clothe tape.  Cleanse B ischium/lower buttocks with Vashe, apply Xeroform gauze ([place] [phone]) to purple maroon discoloration daily and cover with silicone foam.  Cleanse B heels with soap and water .  Apply Xeroform gauze ([facility] [phone]) to purple maroon discoloration daily and secure with silicone foam. Place B feet in Prevalon boots to offload pressure.    POC discussed with bedside nurse.  WOC team will follow every 7 to 10 days to assess wounds and change POC as needed.    Patient should remain on a low air loss mattress when moved out of ICU setting.   Thank you,    Klyde Banka MSN, RN-BC, [employer]

## 2024-07-31 NOTE — Plan of Care (Signed)
  Problem: Intracerebral Hemorrhage Tissue Perfusion: Goal: Complications of Intracerebral Hemorrhage will be minimized Outcome: Progressing   Problem: Elimination: Goal: Will not experience complications related to urinary retention Outcome: Progressing   Problem: Education: Goal: Knowledge of disease or condition will improve Outcome: Not Progressing Goal: Knowledge of secondary prevention will improve (MUST DOCUMENT ALL) Outcome: Not Progressing Goal: Knowledge of patient specific risk factors will improve (DELETE if not current risk factor) Outcome: Not Progressing   Problem: Coping: Goal: Will identify appropriate support needs Outcome: Not Progressing   Problem: Nutrition: Goal: Risk of aspiration will decrease Outcome: Not Progressing Goal: Dietary intake will improve Outcome: Not Progressing   Problem: Education: Goal: Knowledge of General Education information will improve Description: Including pain rating scale, medication(s)/side effects and non-pharmacologic comfort measures Outcome: Not Progressing   Problem: Clinical Measurements: Goal: Ability to maintain clinical measurements within normal limits will improve Outcome: Not Progressing Goal: Will remain free from infection Outcome: Not Progressing Goal: Diagnostic test results will improve Outcome: Not Progressing Goal: Respiratory complications will improve Outcome: Not Progressing Goal: Cardiovascular complication will be avoided Outcome: Not Progressing   Problem: Activity: Goal: Risk for activity intolerance will decrease Outcome: Not Progressing   Problem: Nutrition: Goal: Adequate nutrition will be maintained Outcome: Not Progressing   Problem: Elimination: Goal: Will not experience complications related to bowel motility Outcome: Not Progressing   Problem: Skin Integrity: Goal: Risk for impaired skin integrity will decrease Outcome: Not Progressing   Problem: Fluid Volume: Goal:  Hemodynamic stability will improve Outcome: Not Progressing   Problem: Clinical Measurements: Goal: Signs and symptoms of infection will decrease Outcome: Not Progressing

## 2024-07-31 NOTE — Progress Notes (Signed)
 Pt had an episode of an emesis x 1. Tube feeds stopped, and notified EMA MD.   [name] Victorious Cosio

## 2024-07-31 NOTE — Progress Notes (Signed)
 RN reports mottling has greatly improved without specific intervention from earlier but since developed hive like rash mostly noted on lower abd and thighs.  Pt still has mottling to hands and some to LE but has improved since prior CVL placement.         - lactic has improved from 5.2> 1.2 - BP remains stable off pressors - PCT 0.27> 2.5 - on pepcid  BID already - benadryl  25mg  x 1 to see if any effect. - just completed cefepime  11/20 and restarted 11/27 and last dose 1017, prior hx of PCN allergy, but since broadened to meropenem.      Lyle Pesa, NP [facility] 07/31/2024, 4:09 PM  See Amion for pager If no response to pager , please call [phone] until 7pm After 7:00 pm call Elink  [phone]

## 2024-07-31 NOTE — Plan of Care (Signed)
  Problem: Education: Goal: Knowledge of disease or condition will improve Outcome: Not Progressing Goal: Knowledge of secondary prevention will improve (MUST DOCUMENT ALL) Outcome: Not Progressing Goal: Knowledge of patient specific risk factors will improve (DELETE if not current risk factor) Outcome: Not Progressing

## 2024-07-31 NOTE — Progress Notes (Addendum)
 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 Pt was progressing thru his hospitalization with a superimposed infection 2/2 suspected aspiration pneumonia being empirically treated.    Pt's mental status declined yesterday evening prompting overnight evaluation via RRT. Pt was reportedly found obtunded, tachycardic and tachypneic. VSS however. Abx were broadened, pt was given fluids, and stat cth revealing mildly increased ventricular size concerning for mildly progressive hydrocephalus. Neuro was reached out to who recommended repeat cth to eval progression further at 0800.    Pt's labs at 0200 this am revealed multiple areas for improvement that could contribute to pt's mental status as well. Thru the course of the day however it appears that pt's mental status did not improve, bicarb/renal function/ammonia/lactate/sodium have been worsening as well. Cth has yet to be complete.    Ccm was consulted 11/16 PM as pt remains obtunded and req ICU transfer.  Intubated, on peripheral pressors, multiple abnormal labs  Pertinent  Medical History  Htn  Significant Hospital Events: Including procedures, antibiotic start and stop dates in addition to other pertinent events   11/11 admitted to ICU  11/13 transferred to floor  11/16 CCM consulted and transferred back to ICU 11/16, intubated, on peripheral pressors 11/17-did tolerate, mental status data remain poor 11/18-tolerated weaning pressor support Tolerating weaning, remains encephalopathic11/21 11/22 remains encephalopathic, on full vent support 11/23 encephalopathic - Has remained off sedation . No significant overnight events. Not responsive to  commands  07/27/24 - afebrile . On vent, fio2 30%. On Tube Feed. Niece Randine at bedside. 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 Says patient was never married and no kids. Live in [place] and moved to [place] 5y ago to be near sister . Now sister is in a facility. No overnight issues. They understand trach is an option in their care but reports no decision made TSHJ < 0.1 (same as 07/19/24) FT4 normalized 0.93 (was 2.3 and high 07/19/24)  07/28/24: Did PSV. 30% fio2 on vent. On TF. Afebrile.  Per RN he has followed commands and is awake  with just weakness on right side. Per niece at tbedside -mental status is waxing and waning. Niece is not ready to decide on trach UE DVT - > CT head with natural progresson of bleed -> Niece not decided yet on IV heparin  but rcommended by stroke team Thyroid  meds: reduced Hydrocort  and Inderal  dosing EEG - metabolic encephaloathy 07/29/24 - remains on vent. 40%. Afebrile v 75F . Per RN followed commands yesterday but this morning remains comatose though did have gag (both to RN , niece and MD). Niece gave verbal consent for IV heparin . Niece not readyy for trach  IV Heparing gtt started for UE DVT still on lasix , looks dry but for UE edema which is DVT. CXR is dry ThyroidL: INderal  stopped and HC reduced OBtunded but has gag iPAL with niece - undecided on code status and trach 11/.27/25:  new fever now but wbc better. PEr RN - obtunded and might withdraw to deep pain on RUE and LLE -very minimal Softer BP / condtinued obtundation - > releat Head CT: continued evolution of intraparenchymal hemorrhage in the right thalamus with slightly decreased hyperdense  blood products, slightly increased edema and similar mass effect.  Slight increase in small volume of intraventricular hemorrhage. No hydrocephalus. -> heoarin restarted Long actiing insulin  held Cultures MRSA PCR neg Tach aspirate Blood culture UA abnroma -+    Interim History /  Subjective:   11/27- TSH < 0.1/FT4 normal. ET tbu 4.4cm above carina. Febrile., rising wbc. Overnight vomited -> TF held -> hypogluycemic . Also had  NSVTACH at time of vomit.  Cefepime  stared by eMD. This AM LFT abnormal with rising bilribuin  Post family uodate - Low BP and has mottling LE  D/w Dr Jerri - feels CT head stable but overall ppor prognosis   Objective    Blood pressure 117/78, pulse 94, temperature (!) 100.6 F (38.1 C), resp. rate (!) 21, height 5' 10 (1.778 m), weight 63.5 kg, SpO2 100%.    Vent Mode: PSV;CPAP FiO2 (%):  [40 %] 40 % Set Rate:  [16 bmp] 16 bmp Vt Set:  [440 mL] 440 mL PEEP:  [5 cmH20] 5 cmH20 Pressure Support:  [8 cmH20-13 cmH20] 13 cmH20 Plateau Pressure:  [13 cmH20-15 cmH20] 15 cmH20   Intake/Output Summary (Last 24 hours) at 07/31/2024 0841 Last data filed at 07/31/2024 0744 Gross per 24 hour  Intake 2874.86 ml  Output 1905 ml  Net 969.86 ml   Filed Weights   07/29/24 0500 07/30/24 0500 07/31/24 0500  Weight: 64.5 kg 65.5 kg 63.5 kg    Examination:  General Appearance:  Looks criticall ill  Head:  Normocephalic, without obvious abnormality, atraumatic Eyes:  PERRL - yes, conjunctiva/corneas - muddy     Ears:  Normal external ear canals, both ears Nose:  G tube - yes Throat:  ETT TUBE - yes , OG tube - no Neck:  Supple,  No enlargement/tenderness/nodules Lungs: Clear to auscultation bilaterally, Ventilator   Synchrony - yes Heart:  S1 and S2 normal, no murmur, CVP - no.  Pressors - no Abdomen:  Soft, no masses, no organomegaly Genitalia / Rectal:  Not done Extremities:  Extremities- UE with edema from DVT Skin:  ntact in exposed areas . Sacral area - not examine Neurologic:  Sedation - none -> RASS - -4 . Moves all 4s - x. CAM-ICU - x . Orientation - NOT        Resolved problem list   Assessment and Plan   Intraparenchymal hemorrhage with IVH and hydrocephalus Multiple watershed strokes - CT 11/25 with improved hge -  EEG  11/26 with metabolic encephalopathy  11/28 - Patient remains encephalopathic/obtunded. RASS -4/-5 with gag +This is despite Na improve. EEG with toxic metabolich encephalopathy. Ammonia 44. Off lactulose  since 07/29/24. CT head 11/27 - stable per Dr JERRI  PLAN - Neuroprotective measures- normothermia, euglycemia, HOB greater than 30, head in neutral alignment, normocapnia, normoxia -Neurology continues to follow - monitor without LACTULOSE    Acute Respiratory Failure - intubated 07/19/24  07/31/2024 - > does no meet criteria for SBT/Extubation in setting of Acute Respiratory Failure due to COMA   Plan - No extubation till more awake - ongoing trach/ltac conversations - niece 07/29/24 feels that patient needs more time in ICU with/without EET tube before any decision on trach is made (recent example of her mom who survived sepss)   Hypernatremia  - 07/31/24 - Na normal  Plan -  free water  to Q4h ->to ciontinue - stop lasix  since 07/29/24   Hyperkalemia  Plan' - - recheck - lokelma   Edema UE despite lasix  - noted 07/28/24  UE DVT Left side diagnosed 07/28/24  11/27 - tolerating IV heparin  gtt. No overt bleeding. CT head ? Stable v slightly worse bleed  Plan  IV heparin  gtt from 07/29/24 (post niece consent) - > neuro to indicate if CT head shows worsening bleed  Acute kidney injury - Stabilized  Plan  - montiro off lasix   Hyperthyroidism -On methimazole , propranolol , steroids; - stopped inderal  07/29/24 - reduced HC 07/30/24  11/25: -T4 better; TSH will take a while to improve   Plan  -  hydrocort  -  go bac 11/28 to full dose given shock - monitor off inderal  since 11/26/254 - continue methimazole   - continue Potassium Iodide  but rduce dose 07/29/24  Nutrition - Continue tube feeds  Maxillary sinusitis; last dose 07/28/24 AM Concern for sepsis again 07/30/24 - UTI v Biliarl sepsis  11/28 -> Ongoing fever and sepsis pattern (doubt this is  thyrotoxicosis) with low grade PCt +ve. . New Vomit and Jaunce +. Abnormal UA. On cefepime  overnight  Plan  - monitor -cblood culture Awai t resp culture  - check Procal/lacate  again - Abx as below (later narrow cefepime )  - check RUQ US  - chcange cefepime  to [facility]   Anti-infectives (From admission, onward)    Start     Dose/Rate Route Frequency Ordered Stop   07/31/24 0200  ceFEPIme  (MAXIPIME ) 2 g in sodium chloride  0.9 % 100 mL IVPB        2 g 200 mL/hr over 30 Minutes Intravenous Every 8 hours 07/31/24 0117     07/23/24 1800  doxycycline  (VIBRAMYCIN ) 100 mg in sodium chloride  0.9 % 250 mL IVPB        100 mg 125 mL/hr over 120 Minutes Intravenous Every 12 hours 07/23/24 1109 07/27/24 1927   07/23/24 1200  doxycycline  (VIBRAMYCIN ) 100 mg in sodium chloride  0.9 % 250 mL IVPB  Status:  Discontinued        100 mg 125 mL/hr over 120 Minutes Intravenous Every 12 hours 07/23/24 1106 07/23/24 1109   07/22/24 1000  ceFEPIme  (MAXIPIME ) 2 g in sodium chloride  0.9 % 100 mL IVPB  Status:  Discontinued        2 g 200 mL/hr over 30 Minutes Intravenous Every 12 hours 07/22/24 0854 07/23/24 1106   07/20/24 2200  ceFEPIme  (MAXIPIME ) 2 g in sodium chloride  0.9 % 100 mL IVPB  Status:  Discontinued        2 g 200 mL/hr over 30 Minutes Intravenous Every 24 hours 07/20/24 1044 07/22/24 0854   07/19/24 2200  linezolid  (ZYVOX ) tablet 600 mg  Status:  Discontinued        600 mg Per Tube Every 12 hours 07/19/24 1030 07/20/24 0812   07/18/24 2330  vancomycin  (VANCOREADY) IVPB 1250 mg/250 mL        1,250 mg 166.7 mL/hr over 90 Minutes Intravenous  Once 07/18/24 2235 07/19/24 0209   07/18/24 2330  ceFEPIme  (MAXIPIME ) 2 g in sodium chloride  0.9 % 100 mL IVPB  Status:  Discontinued        2 g 200 mL/hr over 30 Minutes Intravenous Every 12 hours 07/18/24 2235 07/20/24 1044   07/18/24 2245  metroNIDAZOLE  (FLAGYL ) tablet 500 mg  Status:  Discontinued        500 mg Per Tube Every 12 hours 07/18/24 2154  07/20/24 0812   07/18/24 2234  vancomycin  variable dose per unstable renal function (pharmacist dosing)  Status:  Discontinued         Does not apply  See admin instructions 07/18/24 2235 07/19/24 1030   07/18/24 1000  metroNIDAZOLE  (FLAGYL ) tablet 500 mg  Status:  Discontinued        500 mg Oral Every 12 hours 07/18/24 0820 07/18/24 2154   07/18/24 0915  cefTRIAXone  (ROCEPHIN ) 2 g in sodium chloride  0.9 % 100 mL IVPB  Status:  Discontinued        2 g 200 mL/hr over 30 Minutes Intravenous Every 24 hours 07/18/24 0820 07/18/24 2137        Abnormal LFT  New Hyper bilribuininea 07/31/24 New Vomoit 07/31/24  Plan  - KUB - check amylase lipase trop - RUQ US      Hypertension  11/26 - off inderal  for thyrptoxicosis 11/27 - off amlodpine due to soft bp  Plan  - monitor off inderal  and amlodipine   HYPOTENSION   07/31/24 with mottling Lower extremity  plan - fluids for MAP > 65 - pressors if needed   HYPOGLYCEMIA  - new 07/31/24 after vomit and TF held  Plan  D5 at 50cc to continue; increas if needed  FAMILY  Updated niece at bedside 07/24/2024: CCM  start discussing medium term program plans which may mean you will need a tracheostomy and a PEG, will take it a day at a time   11/24 - Niece Randine updated at bedside 07/28/24 [place] Randine updated. Goals of care in progress 07/29/24 - iPAL done - full code. Recommended to niece to discuss/consider DNR with nephew. Niece indicated patient needs some more time in ICU before decision on trach 11/27 - none at bedside 11/28 - called Dwayne Carmin over phone: -> she is coming later today      ATTESTATION & SIGNATURE   The patient JURON VORHEES is critically ill with multiple organ systems failure and requires high complexity decision making for assessment and support, frequent evaluation and titration of therapies, application of advanced monitoring technologies and extensive interpretation of multiple databases and discussion  with other appropriate health care personnel such as bedside nurses, social workers, case production designer, theatre/television/film, consultants, respiratory therapists, nutritionists, secretaries etc.,  Critical care time includes but is not restricted to just documentation time. Documentation can happen in parallel or sequential to care time depending on case mix urgency and priorities for the shift. So, overall critical Care Time devoted to patient care services described in this note is  30  Minutes.   This time reflects time of care of this signee Dr Dorethia Cave which includ does not reflect procedure time, or teaching time or supervisory time of PA/NP/Med student/Med Resident etc but could involve care discussion time     Dr. Dorethia Cave, M.D., [facility].C.P Pulmonary and Critical Care Medicine Staff Physician, [facility] Pager: [phone], If no answer or between  15:00h - 7:00h: call [phone]  07/31/2024 9:12 AM    LABS    PULMONARY Recent Labs  Lab 07/24/24 1217  PHART 7.507*  PCO2ART 36.3  PO2ART 180*  HCO3 28.8*  TCO2 30  O2SAT 100    CBC Recent Labs  Lab 07/28/24 0533 07/30/24 0452 07/31/24 0701  HGB 15.1 14.9 16.2  HCT 47.4 46.9 51.7  WBC 14.8* 11.2* 14.7*  PLT 398 370 320    COAGULATION No results for input(s): INR in the last 168 hours.  CARDIAC  No results for input(s): TROPONINI in the last 168 hours. No results for input(s): PROBNP in the last 168 hours.   CHEMISTRY Recent  Labs  Lab 07/27/24 0620 07/28/24 0533 07/29/24 0448 07/30/24 0452 07/30/24 2030 07/31/24 0701  NA 151* 146* 148* 146* 143 145  K 4.0 3.9 3.6 4.3 4.6 5.5*  CL 109 109 107 105 107 107  CO2 25 25 29 29 27 24   GLUCOSE 269* 233* 136* 132* 80 71  BUN 34* 37* 40* 38* 32* 33*  CREATININE 0.62 0.88 0.84 0.88 0.82 0.95  CALCIUM 9.1 8.6* 8.8* 8.4* 8.2* 8.1*  MG 1.8 2.1  --  1.9 2.1 2.3  PHOS  --  2.5  --  2.9  --  4.2   Estimated Creatinine  Clearance: 60.3 mL/min (by C-G formula based on SCr of 0.95 mg/dL).   LIVER Recent Labs  Lab 07/28/24 0533 07/30/24 0452 07/31/24 0701  AST 51* 60* 67*  ALT 70* 65* 62*  ALKPHOS 177* 189* 223*  BILITOT 0.7 0.8 1.8*  PROT 6.3* 6.2* 6.3*  ALBUMIN 2.1* 2.1* 2.2*     INFECTIOUS Recent Labs  Lab 07/30/24 0844  LATICACIDVEN 1.6  PROCALCITON 0.27      ENDOCRINE CBG (last 3)  Recent Labs    07/31/24 0401 07/31/24 0703 07/31/24 0750  GLUCAP 117* 53* 71         IMAGING x48h  - image(s) personally visualized  -   highlighted in bold DG CHEST PORT 1 VIEW Result Date: 07/31/2024 EXAM: 1 VIEW(S) XRAY OF THE CHEST 07/31/2024 05:20:05 AM COMPARISON: Portable chest 07/30/2024 04:41 AM. CLINICAL HISTORY: Endotracheal tube present. FINDINGS: LINES, TUBES AND DEVICES: Endotracheal tube tip is 4.4 cm from the carinal angle. LUNGS AND PLEURA: The lungs are clear of infiltrates. Right upper lobe apical scarring is again shown. No pleural effusion. No pneumothorax. HEART AND MEDIASTINUM: The mediastinum is stable. The cardiac size is normal. BONES AND SOFT TISSUES: Osteopenia. IMPRESSION: 1. Endotracheal tube tip approximately 4.4 cm above the carina, within acceptable position. 2. No acute cardiopulmonary process. 3. Right upper lobe apical scarring, unchanged. Electronically signed by: Francis Quam MD 07/31/2024 06:39 AM EST RP Workstation: [id number]   DG Abd 1 View Result Date: 07/30/2024 EXAM: 1 VIEW XRAY OF THE ABDOMEN 07/30/2024 09:28:00 PM COMPARISON: 07/18/2024 CLINICAL HISTORY: Belly pain. FINDINGS: BOWEL: Nonobstructive bowel gas pattern. SOFT TISSUES: No opaque urinary calculi. BONES: No acute osseous abnormality. IMPRESSION: 1. No acute findings. Electronically signed by: Franky Crease MD 07/30/2024 09:33 PM EST RP Workstation: [id number]   CT HEAD WO CONTRAST ( ) Result Date: 07/30/2024 EXAM: CT HEAD WITHOUT CONTRAST 07/30/2024 02:06:47 PM TECHNIQUE: CT of the head was  performed without the administration of intravenous contrast. Automated exposure control, iterative reconstruction, and/or weight based adjustment of the mA/kV was utilized to reduce the radiation dose to as low as reasonably achievable. COMPARISON: None available. CLINICAL HISTORY: Stroke, follow up FINDINGS: BRAIN AND VENTRICLES: Continued evolution of intraparenchymal hemorrhage in the right thalamus with slightly decreased hyperdense blood products, slightly increased edema and similar mass effect. Slight increase in small volume of intraventricular hemorrhage. Known small infarct better seen on prior MRI. No evidence of new/interval large vascular territory infarct. Similar patchy white matter hypodensities, compatible with chronic microvascular ischemic change. No hydrocephalus. No extra-axial collection. No mass effect or midline shift. ORBITS: No acute abnormality. SINUSES: Opacified left maxillary sinus. SOFT TISSUES AND SKULL: No acute soft tissue abnormality. No skull fracture. IMPRESSION: 1. Continued evolution of intraparenchymal hemorrhage in the right thalamus with slightly decreased hyperdense blood products, slightly increased edema and similar mass effect. 2. Slight increase in small volume of intraventricular  hemorrhage. No hydrocephalus. Electronically signed by: Gilmore Molt MD 07/30/2024 02:16 PM EST RP Workstation: [id number]   VAS US  LOWER EXTREMITY VENOUS (DVT) Result Date: 07/29/2024  Lower Venous DVT Study Patient Name:  Lawrence Barnett  Date of Exam:   07/29/2024 Medical Rec #: [id number]        Accession #:    [id number] Date of Birth: November 19, 1950        Patient Gender: M Patient Age:   74 years Exam Location:  [facility] Procedure:      VAS US  LOWER EXTREMITY VENOUS (DVT) Referring Phys: DORETHIA CAVE --------------------------------------------------------------------------------  Indications: Edema, and H/O lower extremity DVT.  Comparison Study: No prior exam.  Performing Technologist: Edilia Elden Appl  Examination Guidelines: A complete evaluation includes B-mode imaging, spectral Doppler, color Doppler, and power Doppler as needed of all accessible portions of each vessel. Bilateral testing is considered an integral part of a complete examination. Limited examinations for reoccurring indications may be performed as noted. The reflux portion of the exam is performed with the patient in reverse Trendelenburg.  +---------+---------------+---------+-----------+----------+--------------+ RIGHT    CompressibilityPhasicitySpontaneityPropertiesThrombus Aging +---------+---------------+---------+-----------+----------+--------------+ CFV      Full           Yes      Yes                                 +---------+---------------+---------+-----------+----------+--------------+ SFJ      Full           Yes      Yes                                 +---------+---------------+---------+-----------+----------+--------------+ FV Prox  Full                                                        +---------+---------------+---------+-----------+----------+--------------+ FV Mid   Partial        Yes      Yes                                 +---------+---------------+---------+-----------+----------+--------------+ FV DistalPartial        Yes      Yes                                 +---------+---------------+---------+-----------+----------+--------------+ PFV      Full                                                        +---------+---------------+---------+-----------+----------+--------------+ POP      Partial        Yes      Yes                                 +---------+---------------+---------+-----------+----------+--------------+ PTV      Full                                                        +---------+---------------+---------+-----------+----------+--------------+  PERO     Full                                                         +---------+---------------+---------+-----------+----------+--------------+ Non-occlusive deep vein thrombosis noted in the middle and distal femoral vein and in the popliteal vein.  +---------+---------------+---------+-----------+----------+--------------+ LEFT     CompressibilityPhasicitySpontaneityPropertiesThrombus Aging +---------+---------------+---------+-----------+----------+--------------+ CFV      Full           Yes      Yes                                 +---------+---------------+---------+-----------+----------+--------------+ SFJ      Full           Yes      Yes                                 +---------+---------------+---------+-----------+----------+--------------+ FV Prox  Full                                                        +---------+---------------+---------+-----------+----------+--------------+ FV Mid   Full                                                        +---------+---------------+---------+-----------+----------+--------------+ FV DistalFull                                                        +---------+---------------+---------+-----------+----------+--------------+ PFV      None           No       No                                  +---------+---------------+---------+-----------+----------+--------------+ POP      Full           Yes      Yes                                 +---------+---------------+---------+-----------+----------+--------------+ PTV      Full                                                        +---------+---------------+---------+-----------+----------+--------------+ PERO     Full                                                        +---------+---------------+---------+-----------+----------+--------------+  Deep vein thrombosis noted in the profunda vein.    Summary: RIGHT: - Findings consistent with age indeterminate deep vein thrombosis  involving the right femoral vein, and right popliteal vein.  - No cystic structure found in the popliteal fossa.  LEFT: - Findings consistent with age indeterminate deep vein thrombosis involving the left proximal profunda vein.  - No cystic structure found in the popliteal fossa.  *See table(s) above for measurements and observations. Electronically signed by Lonni Gaskins MD on 07/29/2024 at 1:36:19 PM.    Final

## 2024-07-31 NOTE — Progress Notes (Signed)
 PHARMACY - ANTICOAGULATION CONSULT NOTE  Pharmacy Consult:  Heparin  Indication: Bilateral UE DVTs  Allergies  Allergen Reactions   Clonidine  Other (See Comments)    Weakness, lower extremity paralysis feeling   Penicillins Shortness Of Breath    Patient reports reaction occurred as a teenager and he experienced shortness of breath not requiring hospitalization.    Amlodipine  Swelling   Lisinopril Swelling   Metoprolol Swelling   Tylenol  [Acetaminophen ]     Patient Measurements: Height: 5' 10 (177.8 cm) Weight: 63.5 kg (139 lb 15.9 oz) IBW/kg (Calculated) : 73 HEPARIN  DW (KG): 67.4  Vital Signs: Temp: 99.7 F (37.6 C) (11/28 0715) Temp Source: Esophageal (11/28 0400) BP: 102/74 (11/28 0700) Pulse Rate: 94 (11/28 0715)  Labs: Recent Labs    07/29/24 0448 07/29/24 1750 07/30/24 0452 07/30/24 1255 07/30/24 2030 07/31/24 0701  HGB  --   --  14.9  --   --  16.2  HCT  --   --  46.9  --   --  51.7  PLT  --   --  370  --   --  320  HEPARINUNFRC  --    < > 0.35 0.42  --  0.47  CREATININE 0.84  --  0.88  --  0.82  --    < > = values in this interval not displayed.    Estimated Creatinine Clearance: 69.9 mL/min (by C-G formula based on SCr of 0.82 mg/dL).   Medical History: Past Medical History:  Diagnosis Date   Hypertension      Assessment: 77 YOM presented with ICH and thyroid  storm.  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  Repeat CT on 11/25 showed evolution of R thalamic hemorrhage with mild edema and decreased IVH.  Pharmacy consulted to dose IV heparin  using stroke protocol.    Heparin  level 0.47 (therapeutic) on infusion at 800 units/hr. No bleeding noted.  Goal of Therapy:  Heparin  level 0.3-0.5 units/ml Monitor platelets by anticoagulation protocol: Yes   Plan:  Continue heparin  at 800 units/hr Daily heparin  level and CBC  Vito Ralph, PharmD, BCPS Please see amion for complete clinical pharmacist phone list 07/31/2024 7:47 AM

## 2024-07-31 NOTE — TOC Progression Note (Signed)
 Transition of Care [facility]) - Progression Note    Patient Details  Name: Lawrence Barnett MRN: [id number] Date of Birth: [birth year]  Transition of Care [facility]) CM/SW Contact  Lendia Dais, [place] Phone Number: 07/31/2024, 9:57 AM  Clinical Narrative: Pt is not yet medically stable for SNF.  CSW will continue to follow.      Expected Discharge Plan: Skilled Nursing Facility Barriers to Discharge: Continued Medical Work up               Expected Discharge Plan and Services     Post Acute Care Choice: Skilled Nursing Facility Living arrangements for the past 2 months: Single Family Home                                       Social Drivers of Health (SDOH) Interventions SDOH Screenings   Food Insecurity: No Food Insecurity (07/23/2024)  Housing: Low Risk  (07/23/2024)  Transportation Needs: No Transportation Needs (07/23/2024)  Utilities: Not At Risk (07/23/2024)  Social Connections: Moderately Integrated (07/23/2024)  Tobacco Use: Medium Risk (07/17/2024)    Readmission Risk Interventions     No data to display

## 2024-07-31 NOTE — Progress Notes (Signed)
 STROKE TEAM PROGRESS NOTE   INTERIM HISTORY/SUBJECTIVE No family is at the bedside. Pt still intubated, not open eyes on voice, did not follow any commands. Not responsive. CT repeat evolution of hematoma with slightly increased edema and similar mass effect. Heparin  IV continued.    OBJECTIVE  CBC    Component Value Date/Time   WBC 11.2 (H) 07/30/2024 0452   RBC 5.41 07/30/2024 0452   HGB 14.9 07/30/2024 0452   HGB 16.2 10/07/2012 1618   HCT 46.9 07/30/2024 0452   HCT 47.8 10/07/2012 1618   PLT 370 07/30/2024 0452   PLT 306 10/07/2012 1618   MCV 86.7 07/30/2024 0452   MCV 92 10/07/2012 1618   MCH 27.5 07/30/2024 0452   MCHC 31.8 07/30/2024 0452   RDW 15.8 (H) 07/30/2024 0452   RDW 13.9 10/07/2012 1618   LYMPHSABS 2.1 07/14/2024 1904   MONOABS 0.4 07/14/2024 1904   EOSABS 0.1 07/14/2024 1904   BASOSABS 0.0 07/14/2024 1904    BMET    Component Value Date/Time   NA 143 07/30/2024 2030   NA 139 10/08/2012 0431   K 4.6 07/30/2024 2030   K 4.0 10/08/2012 0431   CL 107 07/30/2024 2030   CL 107 10/08/2012 0431   CO2 27 07/30/2024 2030   CO2 25 10/08/2012 0431   GLUCOSE 80 07/30/2024 2030   GLUCOSE 133 (H) 10/08/2012 0431   BUN 32 (H) 07/30/2024 2030   BUN 10 10/08/2012 0431   CREATININE 0.82 07/30/2024 2030   CREATININE 0.83 10/08/2012 0431   CALCIUM 8.2 (L) 07/30/2024 2030   CALCIUM 9.4 10/08/2012 0431   GFRNONAA >60 07/30/2024 2030   GFRNONAA >60 10/08/2012 0431    IMAGING past 24 hours DG Abd 1 View Result Date: 07/30/2024 EXAM: 1 VIEW XRAY OF THE ABDOMEN 07/30/2024 09:28:00 PM COMPARISON: 07/18/2024 CLINICAL HISTORY: Belly pain. FINDINGS: BOWEL: Nonobstructive bowel gas pattern. SOFT TISSUES: No opaque urinary calculi. BONES: No acute osseous abnormality. IMPRESSION: 1. No acute findings. Electronically signed by: Franky Crease MD 07/30/2024 09:33 PM EST RP Workstation: [id number]   CT HEAD WO CONTRAST ( ) Result Date: 07/30/2024 EXAM: CT HEAD WITHOUT  CONTRAST 07/30/2024 02:06:47 PM TECHNIQUE: CT of the head was performed without the administration of intravenous contrast. Automated exposure control, iterative reconstruction, and/or weight based adjustment of the mA/kV was utilized to reduce the radiation dose to as low as reasonably achievable. COMPARISON: None available. CLINICAL HISTORY: Stroke, follow up FINDINGS: BRAIN AND VENTRICLES: Continued evolution of intraparenchymal hemorrhage in the right thalamus with slightly decreased hyperdense blood products, slightly increased edema and similar mass effect. Slight increase in small volume of intraventricular hemorrhage. Known small infarct better seen on prior MRI. No evidence of new/interval large vascular territory infarct. Similar patchy white matter hypodensities, compatible with chronic microvascular ischemic change. No hydrocephalus. No extra-axial collection. No mass effect or midline shift. ORBITS: No acute abnormality. SINUSES: Opacified left maxillary sinus. SOFT TISSUES AND SKULL: No acute soft tissue abnormality. No skull fracture. IMPRESSION: 1. Continued evolution of intraparenchymal hemorrhage in the right thalamus with slightly decreased hyperdense blood products, slightly increased edema and similar mass effect. 2. Slight increase in small volume of intraventricular hemorrhage. No hydrocephalus. Electronically signed by: Gilmore Molt MD 07/30/2024 02:16 PM EST RP Workstation: [id number]    PHYSICAL EXAM  Temp:  [97.3 F (36.3 C)-101.7 F (38.7 C)] 101.1 F (38.4 C) (11/28 0014) Pulse Rate:  [70-101] 91 (11/28 0014) Resp:  [20-56] 22 (11/28 0014) BP: (81-140)/(57-120) 81/68 (11/28 0012) SpO2:  [  92 %-100 %] 98 % (11/28 0014) FiO2 (%):  [40 %] 40 % (11/28 0000) Weight:  [65.5 kg] 65.5 kg (11/27 0500)  General - Well nourished, well developed, intubated not on sedation.  Ophthalmologic - fundi not visualized due to noncooperation.  Cardiovascular - Regular rate and  rhythm.  Neuro - intubated not on sedation, eyes closed, not open on voice, not able to follow commands. With forced eye opening, eyes in mid position, not blinking to visual threat, not tracking, pupil 2mm bilateral, sluggish to light. Corneal reflex present R strong than left, gag and cough present. Breathing over the vent.  Facial symmetry not able to test due to ET tube.  Tongue protrusion not cooperative. No spontaneous movement in all extremities, not movement with pain stimulation. Sensation, coordination and gait not tested.    ASSESSMENT/PLAN  [name] is a 73 y.o. male with history of hypertension admitted for left-sided hemiplegia.  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 Stroke: numerous new punctate and small acute to early subacute infarcts, watershed distribution, etiology likely related to hypotension from sepsis Code Stroke CT head 7 mm acute IPH centered at right thalamus with IVH, no hydrocephalus CTA head & neck no LVO or hemodynamically significant stenosis, no vascular abnormality underlying right thalamic hemorrhage  Follow-up head CT slight interval increase in size of right thalamic hemorrhage, slightly increased IVH MRI 11/13 acute right thalamic IPH with IVH, stable, no evidence of underlying mass Repeat MRI 11/19 - numerous new punctate and small acute to early subacute infarcts predominantly involving the bilateral centrum semiovale and deep white matter, with additional involvement of the bilateral basal ganglia, cerebral lobes, right cerebellum and right insula consistent with multifocal acute ischemia. 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  CT head 11/25 and 11/27 stable evolution of hematoma, mild cerebral edema no  significant midline shift.  2D Echo EF 55-60% EEG x 2 encephalopathy, no seizure LDL 73 HgbA1c 5.6 UDS neg VTE prophylaxis - none No antithrombotic prior to admission, now on heparin  IV due to BUE proximal DVT Therapy recommendations:  pending Disposition: Pending  Fever with tachycardia ?  Aspiration Leukocytosis Tmax 101.5 WBC 13.1-14.9-14.8--11.2 Concern for aspiration CXR negative-> repeat pending On cefepime  now  Respiratory failure Intubated off sedation Not candidate for extubation  CCM for vent management Likely need trach if aggressive care  DVT BUE swelling UE venous doppler showed Left DVT in the left subclavian vein, left axillary vein, left brachial veins, left radial veins and left ulnar veins as well as right axillary vein and right brachial veins  Repeat CT head showed evolving ICH, now on heparin  IV per stroke protocol for PE prevention given proximal DVTs CCM discussed with niece who agrees with [facility], aware of high bleeding risk  Hypertension Home meds: None BP stable Now on norvasc  10, hydralazine  10 Q6, inderal  15 tid BP goal < 160 Avoid low BP Long-term BP goal normotensive  Lipid management Home meds: None LDL 73, goal < 70 Elevated LFTs AST/ALT 51/70->60/65 Hold off statin until LFTs normalized  Hyperglycemia  CBGs SSI On insulin   AKI Hypernatremia Na 145--148--152-146--143 Cre 0.84-0.99-1.35-1.08--0.88--0.82 On free water  300 Q4 and TF @ 50 Also on D5NS @ 50 Close monitoring  Dysphagia Patient has post-stroke dysphagia On TF @  50 On D5NS @ 50 On FW 300 Q4h  Other Stroke Risk Factors Advanced age  Other medical issues Hyperthyroidism - TSH < 0.001, FT4 normal, on methimazole    Hospital day # 17  This patient is critically ill due to respiratory failure, stroke, ICH, DVTs and at significant risk of neurological worsening, death form recurrent stroke, hematoma expansion, cerebral edema, PE and seizure. This patient's care  requires constant monitoring of vital signs, hemodynamics, respiratory and cardiac monitoring, review of multiple databases, neurological assessment, discussion with family, other specialists and medical decision making of high complexity. I spent 35 minutes of neurocritical care time in the care of this patient. Discussed with CCM Dr. Claudene Ary Cummins, MD PhD Stroke Neurology 07/31/2024 1:19 AM    To contact Stroke Continuity provider, please refer to [email or web address]. After hours, contact General Neurology

## 2024-07-31 NOTE — Procedures (Signed)
 Patient Name: Lawrence Barnett  MRN: [id number]  Epilepsy Attending: Pastor Falling  Referring Physician/Provider: No ref. provider found      Date: 07/31/2024 Duration: 22 minutes   Patient history: Critically sick patient with worsening mental status   Level of alertness: Intubated   AEDs during EEG study: None   Technical aspects: This EEG study was done with scalp electrodes positioned according to the 10-20 International system of electrode placement. Electrical activity was reviewed with band pass filter of 1-70Hz , sensitivity of 7 uV/mm, display speed of 42mm/sec with a 60Hz  notched filter applied as appropriate. EEG data were recorded continuously and digitally stored.  Video monitoring was available and reviewed as appropriate.  Description: EEG showed continuous generalized polymorphic sharply contoured 3 to 6 Hz theta-delta slowing.  EEG showed generalized periodic discharges with triphasic morphology at  Hz, more prominent when awake/stimulated. Hyperventilation and photic stimulation were not performed.     ABNORMALITY - Continuous slow, generalized  IMPRESSION: This study is suggestive of moderate diffuse brain dysfunction such as encephalopathy, nonspecific etiology but likely related to sedation, toxic-metabolic etiology. No seizures or epileptiform discharges were seen throughout the recording.   Pastor Falling MD Neurology

## 2024-07-31 NOTE — Progress Notes (Addendum)
 STROKE TEAM PROGRESS NOTE   INTERIM HISTORY/SUBJECTIVE RN is at the bedside. Pt still intubated, not open eyes on voice, did not follow any commands. Not responsive. Continued to have fever and hypotension, seems in septic shock. Heparin  IV continued. I talked with pt niece and nephew over the phone and updated pt condition. Niece Dwayne will discuss with other families.    OBJECTIVE  CBC    Component Value Date/Time   WBC 14.7 (H) 07/31/2024 0701   RBC 5.77 07/31/2024 0701   HGB 16.2 07/31/2024 0701   HGB 16.2 10/07/2012 1618   HCT 51.7 07/31/2024 0701   HCT 47.8 10/07/2012 1618   PLT 320 07/31/2024 0701   PLT 306 10/07/2012 1618   MCV 89.6 07/31/2024 0701   MCV 92 10/07/2012 1618   MCH 28.1 07/31/2024 0701   MCHC 31.3 07/31/2024 0701   RDW 16.1 (H) 07/31/2024 0701   RDW 13.9 10/07/2012 1618   LYMPHSABS 2.1 07/14/2024 1904   MONOABS 0.4 07/14/2024 1904   EOSABS 0.1 07/14/2024 1904   BASOSABS 0.0 07/14/2024 1904    BMET    Component Value Date/Time   NA 145 07/31/2024 0701   NA 139 10/08/2012 0431   K 5.5 (H) 07/31/2024 0701   K 4.0 10/08/2012 0431   CL 107 07/31/2024 0701   CL 107 10/08/2012 0431   CO2 24 07/31/2024 0701   CO2 25 10/08/2012 0431   GLUCOSE 71 07/31/2024 0701   GLUCOSE 133 (H) 10/08/2012 0431   BUN 33 (H) 07/31/2024 0701   BUN 10 10/08/2012 0431   CREATININE 0.95 07/31/2024 0701   CREATININE 0.83 10/08/2012 0431   CALCIUM 8.1 (L) 07/31/2024 0701   CALCIUM 9.4 10/08/2012 0431   GFRNONAA >60 07/31/2024 0701   GFRNONAA >60 10/08/2012 0431    IMAGING past 24 hours EEG adult Result Date: 07/31/2024 Gregg Lek, MD     07/31/2024  9:08 AM Patient Name: Lawrence Barnett MRN: [id number] Epilepsy Attending: Lek Gregg Referring Physician/Provider: No ref. provider found     Date: 07/31/2024 Duration: 22 minutes Patient history: Critically sick patient with worsening mental status Level of alertness: Intubated AEDs during EEG study: None Technical  aspects: This EEG study was done with scalp electrodes positioned according to the 10-20 International system of electrode placement. Electrical activity was reviewed with band pass filter of 1-70Hz , sensitivity of 7 uV/mm, display speed of 65mm/sec with a 60Hz  notched filter applied as appropriate. EEG data were recorded continuously and digitally stored.  Video monitoring was available and reviewed as appropriate. Description: EEG showed continuous generalized polymorphic sharply contoured 3 to 6 Hz theta-delta slowing. EEG showed generalized periodic discharges with triphasic morphology at  Hz, more prominent when awake/stimulated. Hyperventilation and photic stimulation were not performed.   ABNORMALITY - Continuous slow, generalized IMPRESSION: This study is suggestive of moderate diffuse brain dysfunction such as encephalopathy, nonspecific etiology but likely related to sedation, toxic-metabolic etiology. No seizures or epileptiform discharges were seen throughout the recording. Lek Gregg MD Neurology    DG CHEST PORT 1 VIEW Result Date: 07/31/2024 EXAM: 1 VIEW(S) XRAY OF THE CHEST 07/31/2024 05:20:05 AM COMPARISON: Portable chest 07/30/2024 04:41 AM. CLINICAL HISTORY: Endotracheal tube present. FINDINGS: LINES, TUBES AND DEVICES: Endotracheal tube tip is 4.4 cm from the carinal angle. LUNGS AND PLEURA: The lungs are clear of infiltrates. Right upper lobe apical scarring is again shown. No pleural effusion. No pneumothorax. HEART AND MEDIASTINUM: The mediastinum is stable. The cardiac size is normal. BONES AND SOFT  TISSUES: Osteopenia. IMPRESSION: 1. Endotracheal tube tip approximately 4.4 cm above the carina, within acceptable position. 2. No acute cardiopulmonary process. 3. Right upper lobe apical scarring, unchanged. Electronically signed by: Francis Quam MD 07/31/2024 06:39 AM EST RP Workstation: [id number]   DG Abd 1 View Result Date: 07/30/2024 EXAM: 1 VIEW XRAY OF THE ABDOMEN 07/30/2024  09:28:00 PM COMPARISON: 07/18/2024 CLINICAL HISTORY: Belly pain. FINDINGS: BOWEL: Nonobstructive bowel gas pattern. SOFT TISSUES: No opaque urinary calculi. BONES: No acute osseous abnormality. IMPRESSION: 1. No acute findings. Electronically signed by: Franky Crease MD 07/30/2024 09:33 PM EST RP Workstation: [id number]   CT HEAD WO CONTRAST ( ) Result Date: 07/30/2024 EXAM: CT HEAD WITHOUT CONTRAST 07/30/2024 02:06:47 PM TECHNIQUE: CT of the head was performed without the administration of intravenous contrast. Automated exposure control, iterative reconstruction, and/or weight based adjustment of the mA/kV was utilized to reduce the radiation dose to as low as reasonably achievable. COMPARISON: None available. CLINICAL HISTORY: Stroke, follow up FINDINGS: BRAIN AND VENTRICLES: Continued evolution of intraparenchymal hemorrhage in the right thalamus with slightly decreased hyperdense blood products, slightly increased edema and similar mass effect. Slight increase in small volume of intraventricular hemorrhage. Known small infarct better seen on prior MRI. No evidence of new/interval large vascular territory infarct. Similar patchy white matter hypodensities, compatible with chronic microvascular ischemic change. No hydrocephalus. No extra-axial collection. No mass effect or midline shift. ORBITS: No acute abnormality. SINUSES: Opacified left maxillary sinus. SOFT TISSUES AND SKULL: No acute soft tissue abnormality. No skull fracture. IMPRESSION: 1. Continued evolution of intraparenchymal hemorrhage in the right thalamus with slightly decreased hyperdense blood products, slightly increased edema and similar mass effect. 2. Slight increase in small volume of intraventricular hemorrhage. No hydrocephalus. Electronically signed by: Gilmore Molt MD 07/30/2024 02:16 PM EST RP Workstation: [id number]    PHYSICAL EXAM  Temp:  [90.9 F (32.7 C)-101.7 F (38.7 C)] 100.4 F (38 C) (11/28 0907) Pulse Rate:   [76-105] 94 (11/28 0715) Resp:  [18-35] 25 (11/28 0907) BP: (72-133)/(53-101) 108/72 (11/28 0907) SpO2:  [78 %-100 %] 97 % (11/28 0907) FiO2 (%):  [40 %] 40 % (11/28 0744) Weight:  [63.5 kg] 63.5 kg (11/28 0500)  General - Well nourished, well developed, intubated not on sedation.  Ophthalmologic - fundi not visualized due to noncooperation.  Cardiovascular - Regular rate and rhythm.  Neuro - intubated not on sedation, eyes closed, not open on voice, not able to follow commands. With forced eye opening, eyes in mid position, not blinking to visual threat, not tracking, pupil 2mm bilateral, sluggish to light. Corneal reflex present R strong than left, gag and cough present. Breathing over the vent.  Facial symmetry not able to test due to ET tube.  Tongue protrusion not cooperative. No spontaneous movement in all extremities, not movement with pain stimulation. Sensation, coordination and gait not tested.    ASSESSMENT/PLAN  [name] is a 73 y.o. male with history of hypertension admitted for left-sided hemiplegia.  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 Stroke: numerous new punctate and small acute to early subacute infarcts, watershed distribution, etiology likely related to hypotension from sepsis Code Stroke CT head 7 mm acute IPH centered at right thalamus with IVH, no hydrocephalus CTA head & neck no LVO or hemodynamically significant stenosis, no vascular abnormality underlying  right thalamic hemorrhage  Follow-up head CT slight interval increase in size of right thalamic hemorrhage, slightly increased IVH MRI 11/13 acute right thalamic IPH with IVH, stable, no evidence of underlying mass Repeat MRI 11/19 - numerous new punctate and small acute to early subacute infarcts predominantly involving the  bilateral centrum semiovale and deep white matter, with additional involvement of the bilateral basal ganglia, cerebral lobes, right cerebellum and right insula consistent with multifocal acute ischemia. 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  CT head 11/25 and 11/27 stable evolution of hematoma, mild cerebral edema no significant midline shift.  2D Echo EF 55-60% EEG x 3 encephalopathy, no seizure LDL 73 HgbA1c 5.6 UDS neg VTE prophylaxis - none No antithrombotic prior to admission, now on heparin  IV due to BUE proximal DVT Therapy recommendations:  pending Disposition: Pending, discussed with pt niece Dwayne over the phone, she will discuss with her family over the weekend.   Fever with tachycardia ?  Aspiration Leukocytosis Tmax 101.5 WBC 13.1-14.9-14.8--11.2 Concern for aspiration CXR negative 11/28 Off cefepime  now, changed to meropenem  Respiratory failure Intubated off sedation Not candidate for extubation  CCM for vent management Likely need trach if aggressive care  DVT BUE swelling UE venous doppler showed Left DVT in the left subclavian vein, left axillary vein, left brachial veins, left radial veins and left ulnar veins as well as right axillary vein and right brachial veins  Repeat CT head showed evolving ICH, now on heparin  IV per stroke protocol for PE prevention given proximal DVTs CCM discussed with nephew who agrees with [facility], aware of high bleeding risk   Hypertension Home meds: None BP stable Now on norvasc  10, hydralazine  10 Q6, inderal  15 tid BP goal < 160 Avoid low BP Long-term BP goal normotensive  Lipid management Home meds: None LDL 73, goal < 70 Elevated LFTs AST/ALT 51/70->60/65->67/62 Hold off statin until LFTs normalized  Hyperglycemia  CBGs SSI On insulin   AKI Hypernatremia Na 145--148--152-146--143-145 Cre 0.84-0.99-1.35-1.08--0.88--0.82-0.95 free water  300 Q4 on hold and  TF @ 50 on hold On D5NS @ 50 Close monitoring  Dysphagia Patient has post-stroke dysphagia On hold TF @ 50 On D5NS @ 50 On hold FW 300 Q4h  Other Stroke Risk Factors Advanced age  Other medical issues Hyperthyroidism - TSH < 0.001, FT4 normal, on methimazole    Hospital day # 17  This patient is critically ill due to respiratory failure, stroke, ICH, DVTs and at significant risk of neurological worsening, death form recurrent stroke, hematoma expansion, cerebral edema, PE and seizure. This patient's care requires constant monitoring of vital signs, hemodynamics, respiratory and cardiac monitoring, review of multiple databases, neurological assessment, discussion with family, other specialists and medical decision making of high complexity. I spent 35 minutes of neurocritical care time in the care of this patient. Discussed with CCM Dr. Geronimo. I had long discussion with nephew and niece on the phone, updated pt current condition, treatment plan and poor prognosis, and answered all the questions. They expressed understanding and appreciation.    Ary Cummins, MD PhD Stroke Neurology 07/31/2024 11:12 AM    To contact Stroke Continuity provider, please refer to [email or web address]. After hours, contact General Neurology

## 2024-07-31 NOTE — Progress Notes (Signed)
 Nutrition Follow-up  DOCUMENTATION CODES:   Severe malnutrition in context of social or environmental circumstances, Underweight  INTERVENTION:  Resume TF via Cortrak as medically feasible: Osmolite 1.5 at goal rate of 49ml/hr ( per day) Provides 1800 kcal, 75g protein and free water  daily   Free water  flush 300ml q4h ( total daily) Total free water  (TF+FWF)-   NUTRITION DIAGNOSIS:  Severe Malnutrition related to social / environmental circumstances as evidenced by severe fat depletion, severe muscle depletion. 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 Throughout admission, pt became obtunded, tachycardic and tachypneic requiring transfer to ICU and subsequent intubation. PMH significant for HTN.  11/11: admitted with ICH 11/16: transferred back to ICU, intubated; repeat CT head- slight increase in IVH 11/17: Cortrak placed 11/19: MRI brain- Acute ischemic infarcts: numerous new punctate and small acute to early subacute infarcts, watershed territory; etiology likely hypotension    Patient remains intubated on vent support. Tube were held overnight due to emesis x2, were infusing at goal rate prior to being held. Maintenance IV fluids started. Lactulose  stopped 11/26, pt continues with liquid stool via FMS   Noted discussions for possible trach/PEG, ongoing GOC with family.  Admit weight: 67.4 kg Current weight: 63.5 kg  UOP: 1790 ml x24 hours FMS: 50ml x24 hours   Drains/lines: Cortrak FMS  Medications: Pepcid , solu cortef ,  SSI 0-9 units q 4 hrs, D5NS @ 50 ml/hr   Labs: Na 146, Glu  100, BUN 32, Calcium 8.1   Diet Order:   Diet Order             Diet NPO time specified  Diet effective now                   EDUCATION NEEDS:  No education needs have been identified at this time  Skin:  Skin Assessment: Skin Integrity Issues: Skin Integrity Issues:: Other (Comment) Other: PI to bilateral heels  Last BM:  11/24 via FMS  Height:  Ht Readings from Last 1 Encounters:  07/20/24 5' 10 (1.778 m)    Weight:   Wt Readings from Last 1 Encounters:  07/31/24 63.5 kg   BMI:  Body mass index is 20.09 kg/m.  Estimated Nutritional Needs:   Kcal:  1700-1900  Protein:  75-90g  Fluid:  >/=1.7L  Drako Maese, MS, RD, LDN Clinical Dietitian  Contact via secure chat. If unavailable, use group chat RD Inpatient.

## 2024-07-31 NOTE — IPAL (Signed)
 I called pt POA nephew [name] this morning, but he told me that he will follow whatever pt Niece Tracey's decision. I then called Dwayne, updated pt current condition of deterioration, fever, sepsis, septic shock, worsening mental status, b/l UE extensive DVT, on heparin  IV although with ICH, and likely poor prognosis without functional recovery and low quality of life even if he is able to survive this. 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 She stated that she had been hearing some inconsistent progresses about pt in the last week also but understood that pt was sick and in serious condition. However, she stated that her mom in the past was in hospital for 6 months for her medical condition, and currently pt only here for the last 3 weeks, she would like more time to see if pt could make any further progress. She will talk to other family members over the weekend and continue the discussion with us  early next week. I updated Dr. Geronimo about the conversation.   Ary Cummins, MD PhD Stroke Neurology 07/31/2024 11:46 AM

## 2024-07-31 NOTE — Procedures (Signed)
 Central Venous Catheter Insertion Procedure Note  JAIMIE REDDITT  [id number]  1950-11-02  Date:07/31/24  Time:1:24 PM   Provider Performing:Brooke Antonetta   Procedure: Insertion of Non-tunneled Central Venous Catheter(36556) with US  guidance (23062)   Indication(s) Medication administration; limited PIV access with BUE DVTs  Consent Risks of the procedure as well as the alternatives and risks of each were explained to the patient and/or caregiver.  Consent for the procedure was obtained and is signed in the bedside chart  Anesthesia Topical only with 1% lidocaine   Timeout Verified patient identification, verified procedure, site/side was marked, verified correct patient position, special equipment/implants available, medications/allergies/relevant history reviewed, required imaging and test results available.  Sterile Technique Maximal sterile technique including full sterile barrier drape, hand hygiene, sterile gown, sterile gloves, mask, hair covering, sterile ultrasound probe cover (if used).  Procedure Description Area of catheter insertion was cleaned with chlorhexidine  and draped in sterile fashion.  With real-time ultrasound guidance a central venous catheter was placed into the right internal jugular vein to 17cm.  Nonpulsatile blood flow and easy flushing noted in all ports.  The catheter was sutured in place, biopatch and sterile dressing applied.    Complications/Tolerance None; patient tolerated the procedure well. Chest X-ray is ordered to verify placement for internal jugular or subclavian cannulation.   Chest x-ray is not ordered for femoral cannulation.  EBL Minimal  Specimen(s) None    Lyle Antonetta, NP [facility] 07/31/2024, 1:26 PM  See Amion for pager If no response to pager , please call [phone] until 7pm After 7:00 pm call Elink  [phone]

## 2024-07-31 NOTE — Progress Notes (Signed)
    Circulatory shock Mottled Lacate 5  Informed niece that this  is major setback PICC line not feasible due to DVT UE Place CVL - she verbally consented     SIGNATURE    Dr. Dorethia Cave, M.D., F.C.C.P,  Pulmonary and Critical Care Medicine Staff Physician, [facility] Center Director - Interstitial Lung Disease  Program  Pulmonary Fibrosis [facility] at [facility] [place], [place], [address]   Pager: [phone], If no answer  -> Check AMION or Try [phone] Telephone (clinical office): [phone] Telephone (research): [phone]  11:51 AM 07/31/2024

## 2024-07-31 NOTE — Progress Notes (Addendum)
 eLink Physician-Brief Progress Note Patient Name: Lawrence Barnett DOB: [birth year] MRN: [id number]   Date of Service  07/31/2024  HPI/Events of Note  Episode of emesis. CBG 71 at 0701 today with following reads 90-108. KUB today nonobstructive pattern.    eICU Interventions  Will hold tube feeds and trial again overnight. If emesis again, will need repeat KUB   10:15 PM - KUB ordered. Discussed with pharmacy and no IV formulation for methimazole  and K iodide available. Will attempt to give meds after administration of anti-emetics. 5:25 AM - KUB with no acute changes. D5 NS ended at 3AM. No further emesis. Will attempt to restart tube feeds again starting at 10 cc/hr and slowly titrate to goal   Intervention Category Minor Interventions: Routine modifications to care plan (e.g. PRN medications for pain, fever)  Izayiah Tibbitts Slater Staff 07/31/2024, 7:44 PM

## 2024-08-01 DIAGNOSIS — I10 Essential (primary) hypertension: Secondary | ICD-10-CM | POA: Diagnosis not present

## 2024-08-01 DIAGNOSIS — I610 Nontraumatic intracerebral hemorrhage in hemisphere, subcortical: Secondary | ICD-10-CM | POA: Diagnosis not present

## 2024-08-01 DIAGNOSIS — I615 Nontraumatic intracerebral hemorrhage, intraventricular: Secondary | ICD-10-CM | POA: Diagnosis not present

## 2024-08-01 DIAGNOSIS — I6389 Other cerebral infarction: Secondary | ICD-10-CM | POA: Diagnosis not present

## 2024-08-01 LAB — PROTIME-INR
INR: 1.3 — ABNORMAL HIGH (ref 0.8–1.2)
Prothrombin Time: 17.2 s — ABNORMAL HIGH (ref 11.4–15.2)

## 2024-08-01 LAB — CBC
HCT: 44.8 % (ref 39.0–52.0)
Hemoglobin: 14.2 g/dL (ref 13.0–17.0)
MCH: 27.6 pg (ref 26.0–34.0)
MCHC: 31.7 g/dL (ref 30.0–36.0)
MCV: 87 fL (ref 80.0–100.0)
Platelets: 290 K/uL (ref 150–400)
RBC: 5.15 MIL/uL (ref 4.22–5.81)
RDW: 15.8 % — ABNORMAL HIGH (ref 11.5–15.5)
WBC: 9.5 K/uL (ref 4.0–10.5)
nRBC: 0 % (ref 0.0–0.2)

## 2024-08-01 LAB — COMPREHENSIVE METABOLIC PANEL WITH GFR
ALT: 53 U/L — ABNORMAL HIGH (ref 0–44)
AST: 57 U/L — ABNORMAL HIGH (ref 15–41)
Albumin: 1.9 g/dL — ABNORMAL LOW (ref 3.5–5.0)
Alkaline Phosphatase: 208 U/L — ABNORMAL HIGH (ref 38–126)
Anion gap: 9 (ref 5–15)
BUN: 23 mg/dL (ref 8–23)
CO2: 28 mmol/L (ref 22–32)
Calcium: 7.9 mg/dL — ABNORMAL LOW (ref 8.9–10.3)
Chloride: 110 mmol/L (ref 98–111)
Creatinine, Ser: 0.74 mg/dL (ref 0.61–1.24)
GFR, Estimated: >60 mL/min (ref >60)
Glucose, Bld: 105 mg/dL — ABNORMAL HIGH (ref 70–99)
Potassium: 3.8 mmol/L (ref 3.5–5.1)
Sodium: 147 mmol/L — ABNORMAL HIGH (ref 135–145)
Total Bilirubin: 1.4 mg/dL — ABNORMAL HIGH (ref 0.0–1.2)
Total Protein: 5.7 g/dL — ABNORMAL LOW (ref 6.5–8.1)

## 2024-08-01 LAB — CULTURE, RESPIRATORY W GRAM STAIN: Gram Stain: NONE SEEN

## 2024-08-01 LAB — GLUCOSE, CAPILLARY
Glucose-Capillary: 105 mg/dL — ABNORMAL HIGH (ref 70–99)
Glucose-Capillary: 125 mg/dL — ABNORMAL HIGH (ref 70–99)
Glucose-Capillary: 128 mg/dL — ABNORMAL HIGH (ref 70–99)
Glucose-Capillary: 130 mg/dL — ABNORMAL HIGH (ref 70–99)
Glucose-Capillary: 178 mg/dL — ABNORMAL HIGH (ref 70–99)
Glucose-Capillary: 98 mg/dL (ref 70–99)

## 2024-08-01 LAB — HEPARIN LEVEL (UNFRACTIONATED): Heparin Unfractionated: 0.38 [IU]/mL (ref 0.30–0.70)

## 2024-08-01 LAB — MAGNESIUM: Magnesium: 2 mg/dL (ref 1.7–2.4)

## 2024-08-01 MED ORDER — DEXTROSE-SODIUM CHLORIDE 5-0.9 % IV SOLN: INTRAVENOUS | Status: AC

## 2024-08-01 MED ORDER — SODIUM CHLORIDE 0.9 % IV SOLN
250.0000 mL | INTRAVENOUS | Status: DC
  Administered 2024-08-01: 250 mL via INTRAVENOUS

## 2024-08-01 NOTE — Progress Notes (Addendum)
 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 Pt was progressing thru his hospitalization with a superimposed infection 2/2 suspected aspiration pneumonia being empirically treated.    Pt's mental status declined yesterday evening prompting overnight evaluation via RRT. Pt was reportedly found obtunded, tachycardic and tachypneic. VSS however. Abx were broadened, pt was given fluids, and stat cth revealing mildly increased ventricular size concerning for mildly progressive hydrocephalus. Neuro was reached out to who recommended repeat cth to eval progression further at 0800.    Pt's labs at 0200 this am revealed multiple areas for improvement that could contribute to pt's mental status as well. Thru the course of the day however it appears that pt's mental status did not improve, bicarb/renal function/ammonia/lactate/sodium have been worsening as well. Cth has yet to be complete.    Ccm was consulted 11/16 PM as pt remains obtunded and req ICU transfer.  Intubated, on peripheral pressors, multiple abnormal labs  Pertinent  Medical History  Htn  Significant Hospital Events: Including procedures, antibiotic start and stop dates in addition to other pertinent events   11/11 admitted to ICU  11/13 transferred to floor  11/16 CCM consulted and transferred back to ICU 11/16, intubated, on peripheral pressors 11/17-did tolerate, mental status data remain poor 11/18-tolerated weaning pressor support Tolerating weaning, remains encephalopathic11/21 11/22 remains encephalopathic, on full vent support 11/23 encephalopathic - Has remained off sedation . No significant overnight events. Not responsive to  commands  07/27/24 - afebrile . On vent, fio2 30%. On Tube Feed. Niece Randine at bedside. 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 Says patient was never married and no kids. Live in [place] and moved to [place] 5y ago to be near sister . Now sister is in a facility. No overnight issues. They understand trach is an option in their care but reports no decision made TSHJ < 0.1 (same as 07/19/24) FT4 normalized 0.93 (was 2.3 and high 07/19/24)  07/28/24: Did PSV. 30% fio2 on vent. On TF. Afebrile.  Per RN he has followed commands and is awake  with just weakness on right side. Per niece at tbedside -mental status is waxing and waning. Niece is not ready to decide on trach UE DVT - > CT head with natural progresson of bleed -> Niece not decided yet on IV heparin  but rcommended by stroke team Thyroid  meds: reduced Hydrocort  and Inderal  dosing EEG - metabolic encephaloathy 07/29/24 - remains on vent. 40%. Afebrile v 69F . Per RN followed commands yesterday but this morning remains comatose though did have gag (both to RN , niece and MD). Niece gave verbal consent for IV heparin . Niece not readyy for trach  IV Heparing gtt started for UE DVT still on lasix , looks dry but for UE edema which is DVT. CXR is dry ThyroidL: INderal  stopped and HC reduced OBtunded but has gag iPAL with niece - undecided on code status and trach 11/.27/25:  new fever now but wbc better. PEr RN - obtunded and might withdraw to deep pain on RUE and LLE -very minimal Softer BP / condtinued obtundation - > releat Head CT: continued evolution of intraparenchymal hemorrhage in the right thalamus with slightly decreased hyperdense  blood products, slightly increased edema and similar mass effect.  Slight increase in small volume of intraventricular hemorrhage. No hydrocephalus. -> heoarin restarted Long actiing insulin  held Cultures MRSA PCR neg Tach aspirate - FEW STAPH SPECIES Blood culture UA abnroma -+ 11/27-  TSH < 0.1/FT4 normal. ET tbu 4.4cm above carina. Febrile., rising wbc. Overnight vomited -> TF held -> hypogluycemic . Also had  NSVTACH at time of vomit.  Cefepime  stared by eMD. This AM LFT abnormal with rising bilribuin. Post family uodate - Low BP and has mottling LE. D/w Dr Jerri - feels CT head stable but overall ppor prognosis  S/p CVL R internal jugular Lioase  81 and < 2X ULN , AMUYLASE Also up. RUQ US   -> Circumferential gallbladder wall thickening with wall edema, which in the absence of stones and pericholecystic fluid, may be due to acute hepatitis, chronic liver disease, upper abdominal inflammation, or volume overload from either CHF or renal failure.  iPAL by NEURO - full code. Nephew is DPOA but defers decision to niece. Niece does not want to consider trach (commit v not do) till at least 3 weeks post intubation Changed tO MERREM   Interim History / Subjective:   07/31/24 - trach aspirate growing staph. Remains on IV heparin  gtt. Off levophed , On Vent  Fio2 40%. FEbrile since 07/29/24. Also vomited for 2nd day in a row at evenign wwith trickle feeds. LFT better. Remains obtunded. Mottlin improved but has hives (pre Merrem , psost cefepime )  Objective    Blood pressure 132/87, pulse (!) 112, temperature 98.1 F (36.7 C), resp. rate (!) 21, height 5' 10 2024/08/07 m), weight 62.4 kg, SpO2 100%.    Vent Mode: CPAP;PSV FiO2 (%):  [40 %] 40 % Set Rate:  [14 bmp-16 bmp] 14 bmp Vt Set:  [440 mL] 440 mL PEEP:  [5 cmH20] 5 cmH20 Pressure Support:  [12 cmH20-13 cmH20] 12 cmH20 Plateau Pressure:  [12 cmH20] 12 cmH20   Intake/Output Summary (Last 24 hours) at 08/01/2024 0835 Last data filed at 08/01/2024 0700 Gross per 24 hour  Intake 3075.43 ml  Output 750 ml  Net 2325.43 ml   Filed Weights   07/30/24 0500 07/31/24 0500 08/01/24 0347  Weight: 65.5 kg 63.5 kg 62.4 kg    Examination: General Appearance:  Looks criticall ill Head:  Normocephalic, without obvious abnormality,  atraumatic Eyes:  PERRL - yes, conjunctiva/corneas - muddy     Ears:  Normal external ear canals, both ears Nose:  G tube - YES Throat:  ETT TUBE - YES , OG tube - no Neck:  Supple,  No enlargement/tenderness/nodules Lungs: Clear to auscultation bilaterally, Ventilator   Synchrony - YES Heart:  S1 and S2 normal, no murmur, CVP - no.  Pressors - OFF Abdomen:  Soft, no masses, no organomegaly Genitalia / Rectal:  Not done Extremities:  Extremities- intact. With BOOTS Skin:  ntact in exposed areas . Sacral area - DEUCUB +. - HAPI + per RN. HAS HIVES  in AREA WAIST DOWN Neurologic:  Sedation - none -> RASS - -4 Moves all 4s - yes. CAM-ICU - cannot test . Orientation - NOT        Resolved problem list   Assessment and Plan   Intraparenchymal hemorrhage with IVH and hydrocephalus Multiple watershed strokes - CT 11/25 with improved hge -  EEG 11/26 with metabolic encephalopathy  11/29 -  Off lactulose  since 07/29/24. CT head 11/27 - stable per Dr JERRI.  Remains COMATOSE/OBTUNDED  PLAN - Neuroprotective measures-  normothermia, euglycemia, HOB greater than 30, head in neutral alignment, normocapnia, normoxia -Neurology continues to follow - monitor without LACTULOSE    Acute Respiratory Failure - intubated 07/19/24  08/01/2024 - > does NOT meet criteria for SBT/Extubation in setting of Acute Respiratory Failure due to COMA   Plan - No extubation till more awake - ongoing trach/ltac conversations - niece 07/29/24 feels that patient needs more time in ICU with/without EET tube before any decision on trach is made (recent example of her mom who survived sepss)   Hypernatremia  - 08/01/24 - Na mild high  Plan -  free water  to Q4h ->to ciontinue - monitor off lasix  since 07/29/24   Edema UE despite lasix  - noted 07/28/24 UE DVT Left side diagnosed 07/28/24  11/29 - tolerating IV heparin  gtt. No overt bleeding. CT head 11/27 stable  Plan  IV heparin  gtt from 07/29/24  (post niece consent) - > neuro to indicate if CT head shows worsening bleed  Acute kidney injury - Stabilized  Plan  - monitor off lasix   Hyperthyroidism -On methimazole , propranolol , steroids; - stopped inderal  07/29/24 - reduced HC 07/30/24  11/25: -T4 better; TSH will take a while to improve   Plan  - DC hydrocort  08/01/24 - monitor off inderal  since 11/26/254 - continue methimazole   - continue Potassium Iodide  but rduce dose 07/29/24  Nutrition - Continue tube feeds  SEptic shock 07/31/24   - off pressors since 08/01/24  Plan  - MAP goal > 65  Maxillary sinusitis; last dose 07/28/24 AM Concern for sepsis again 07/30/24 - resp culture growing Staph   11/29 - REsp cutlure growing staph but SIRS improving  Plan - MErrem as below  - monitor -cblood culture Awai t resp culture    Anti-infectives (From admission, onward)    Start     Dose/Rate Route Frequency Ordered Stop   07/31/24 2000  meropenem (MERREM) 1 g in sodium chloride  0.9 % 100 mL IVPB        1 g 200 mL/hr over 30 Minutes Intravenous Every 8 hours 07/31/24 1109     07/31/24 0200  ceFEPIme  (MAXIPIME ) 2 g in sodium chloride  0.9 % 100 mL IVPB  Status:  Discontinued        2 g 200 mL/hr over 30 Minutes Intravenous Every 8 hours 07/31/24 0117 07/31/24 1109   07/23/24 1800  doxycycline  (VIBRAMYCIN ) 100 mg in sodium chloride  0.9 % 250 mL IVPB        100 mg 125 mL/hr over 120 Minutes Intravenous Every 12 hours 07/23/24 1109 07/27/24 1927   07/23/24 1200  doxycycline  (VIBRAMYCIN ) 100 mg in sodium chloride  0.9 % 250 mL IVPB  Status:  Discontinued        100 mg 125 mL/hr over 120 Minutes Intravenous Every 12 hours 07/23/24 1106 07/23/24 1109   07/22/24 1000  ceFEPIme  (MAXIPIME ) 2 g in sodium chloride  0.9 % 100 mL IVPB  Status:  Discontinued        2 g 200 mL/hr over 30 Minutes Intravenous Every 12 hours 07/22/24 0854 07/23/24 1106   07/20/24 2200  ceFEPIme  (MAXIPIME ) 2 g in sodium chloride  0.9 % 100 mL  IVPB  Status:  Discontinued        2 g 200 mL/hr over 30 Minutes Intravenous Every 24 hours 07/20/24 1044 07/22/24 0854   07/19/24 2200  linezolid  (ZYVOX ) tablet 600 mg  Status:  Discontinued        600 mg Per Tube Every 12 hours 07/19/24  1030 07/20/24 0812   07/18/24 2330  vancomycin  (VANCOREADY) IVPB 1250 mg/250 mL        1,250 mg 166.7 mL/hr over 90 Minutes Intravenous  Once 07/18/24 2235 07/19/24 0209   07/18/24 2330  ceFEPIme  (MAXIPIME ) 2 g in sodium chloride  0.9 % 100 mL IVPB  Status:  Discontinued        2 g 200 mL/hr over 30 Minutes Intravenous Every 12 hours 07/18/24 2235 07/20/24 1044   07/18/24 2245  metroNIDAZOLE  (FLAGYL ) tablet 500 mg  Status:  Discontinued        500 mg Per Tube Every 12 hours 07/18/24 2154 07/20/24 0812   07/18/24 2234  vancomycin  variable dose per unstable renal function (pharmacist dosing)  Status:  Discontinued         Does not apply See admin instructions 07/18/24 2235 07/19/24 1030   07/18/24 1000  metroNIDAZOLE  (FLAGYL ) tablet 500 mg  Status:  Discontinued        500 mg Oral Every 12 hours 07/18/24 0820 07/18/24 2154   07/18/24 0915  cefTRIAXone  (ROCEPHIN ) 2 g in sodium chloride  0.9 % 100 mL IVPB  Status:  Discontinued        2 g 200 mL/hr over 30 Minutes Intravenous Every 24 hours 07/18/24 0820 07/18/24 2137        Abnormal LFT - imprvoing 11/29 New Hyper bilribuininea 07/31/24 - resolved 08/01/24 New Vomoit 07/31/24 x 2 with trickle   - LFT improving bt still voited ? Pancreatitis. 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 Goals of care in progress 07/29/24 - iPAL done - full code. Recommended to niece to discuss/consider DNR with nephew. Niece indicated patient needs some more time in ICU before decision on trach 11/27 - none at bedside 11/28 - called Dwayne Carmin over phone: updated-> she is coming later today.IPAL done  by neuro 11/29 - Niece Kyal Arts - > updated over phone      ATTESTATION & SIGNATURE   The patient Lawrence Barnett is critically ill with multiple organ systems failure and requires high complexity decision making for assessment and support, frequent evaluation and titration of therapies, application of advanced monitoring technologies and extensive interpretation of multiple databases and discussion with other appropriate health care personnel such as bedside nurses, social workers, case production designer, theatre/television/film, consultants, respiratory therapists, nutritionists, secretaries etc.,  Critical care time includes but is not restricted to just documentation time. Documentation can happen in parallel or sequential to care time depending on case mix urgency and priorities for the shift. So, overall critical Care Time devoted to patient care services described in this note is  35  Minutes.   This time reflects time of care of this signee Dr Dorethia Cave which includ does not reflect procedure time, or teaching time or supervisory time of  PA/NP/Med student/Med Resident etc but could involve care discussion time     Dr. Dorethia Cave, M.D., [facility]C.P Pulmonary and Critical Care Medicine Staff Physician, [facility] [place] [facility] [facility] Pager: [phone], If no answer or between  15:00h - 7:00h: call [phone]  08/01/2024 9:04 AM  LABS    PULMONARY No results for input(s): PHART,  PCO2ART, PO2ART, HCO3, TCO2, O2SAT in the last 168 hours.  Invalid input(s): PCO2, PO2   CBC Recent Labs  Lab 07/30/24 0452 07/31/24 0701 08/01/24 0344  HGB 14.9 16.2 14.2  HCT 46.9 51.7 44.8  WBC 11.2* 14.7* 9.5  PLT 370 320 290    COAGULATION Recent Labs  Lab 08/01/24 0344  INR 1.3*    CARDIAC  No results for input(s): TROPONINI in the last 168 hours. No results for input(s): PROBNP in the last 168 hours.   CHEMISTRY Recent Labs  Lab 07/28/24 0533 07/29/24 0448 07/30/24 0452 07/30/24 2030 07/31/24 0701 07/31/24 0949 08/01/24 0344  NA 146*   < > 146* 143 145 146* 147*  K 3.9   < > 4.3 4.6 5.5* 5.0 3.8  CL 109   < > 105 107 107 106 110  CO2 25   < > 29 27 24 25 28   GLUCOSE 233*   < > 132* 80 71 100* 105*  BUN 37*   < > 38* 32* 33* 32* 23  CREATININE 0.88   < > 0.88 0.82 0.95 0.93 0.74  CALCIUM 8.6*   < > 8.4* 8.2* 8.1* 8.1* 7.9*  MG 2.1  --  1.9 2.1 2.3  --  2.0  PHOS 2.5  --  2.9  --  4.2  --   --    < > = values in this interval not displayed.   Estimated Creatinine Clearance: 70.4 mL/min (by C-G formula based on SCr of 0.74 mg/dL).   LIVER Recent Labs  Lab 07/28/24 0533 07/30/24 0452 07/31/24 0701 08/01/24 0344  AST 51* 60* 67* 57*  ALT 70* 65* 62* 53*  ALKPHOS 177* 189* 223* 208*  BILITOT 0.7 0.8 1.8* 1.4*  PROT 6.3* 6.2* 6.3* 5.7*  ALBUMIN 2.1* 2.1* 2.2* 1.9*  INR  --   --   --  1.3*     INFECTIOUS Recent Labs  Lab 07/30/24 0844 07/31/24 0949 07/31/24 1511  LATICACIDVEN 1.6 5.2* 1.2  PROCALCITON 0.27 2.50  --       ENDOCRINE CBG (last 3)  Recent Labs    07/31/24 2323 08/01/24 0326 08/01/24 0708  GLUCAP 97 98 125*         IMAGING x48h  - image(s) personally visualized  -   highlighted in bold DG Abd 1 View Result Date: 07/31/2024 EXAM: 1 VIEW XRAY OF THE ABDOMEN 07/31/2024 10:32:59 PM COMPARISON: X-rays earlier today . CLINICAL HISTORY: Emesis. FINDINGS: LINES, TUBES AND DEVICES: Feeding tube  tip in the distal stomach. BOWEL: Nonobstructive bowel gas pattern. SOFT TISSUES: No opaque urinary calculi. BONES: No acute osseous abnormality. IMPRESSION: 1. Feeding tube tip terminates in the distal stomach. 2. No acute findings. Electronically signed by: Norman Gatlin MD 07/31/2024 10:37 PM EST RP Workstation: [id number]   US  Abdomen Limited RUQ (LIVER/GB) Result Date: 07/31/2024 CLINICAL DATA:  [id number] Vomiting [id number] , elevated serum bilirubin EXAM: ULTRASOUND ABDOMEN LIMITED RIGHT UPPER QUADRANT COMPARISON:  07/31/2024, July 20, 2024 FINDINGS: Gallbladder: No gallstones. Diffuse circumferential gallbladder wall thickening with wall edema, overall measuring 7 mm thick. No pericholecystic  fluid. Unable to evaluate for a sonographic Murphy's sign as the patient was intubated and sedated during the exam. 3 x 4 mm echogenicity along the nondependent gallbladder wall, possibly adherent biliary sludge or a small gallbladder polyp. Common bile duct: Diameter: 4 mm Liver: Normal echogenicity. No focal lesion identified. No intrahepatic biliary ductal dilation. Portal vein is patent on color Doppler imaging with normal direction of blood flow towards the liver. Other: None. IMPRESSION: Circumferential gallbladder wall thickening with wall edema, which in the absence of stones and pericholecystic fluid, may be due to acute hepatitis, chronic liver disease, upper abdominal inflammation, or volume overload from either CHF or renal failure. Laboratory correlation recommended. Electronically Signed   By: Rogelia Myers M.D.   On: 07/31/2024 14:59   DG CHEST PORT 1 VIEW Result Date: 07/31/2024 CLINICAL DATA:  [id number] Encounter for central line placement [id number] EXAM: PORTABLE CHEST - 1 VIEW COMPARISON:  07/31/2024 5 a.m. FINDINGS: Endotracheal tube in place terminating in the lower trachea. Esophageal temperature probe noted in the mid upper esophagus. Weighted feeding tube courses below the diaphragm, extending  outside the field of view. Interval placement of a right IJ central venous catheter, which terminates in the lower SVC. Biapical pleural thickening. No focal airspace consolidation, pleural effusion, or pneumothorax. No cardiomegaly. Tortuous aorta with aortic atherosclerosis. No acute fracture or destructive lesions. Multilevel thoracic osteophytosis. IMPRESSION: Interval placement of a right IJ approach central venous catheter, which terminates in the lower SVC. No pneumothorax. Electronically Signed   By: Rogelia Myers M.D.   On: 07/31/2024 14:51   DG Abd Portable 1V Result Date: 07/31/2024 EXAM: 1 VIEW XRAY OF THE ABDOMEN 07/31/2024 09:24:00 AM COMPARISON: 07/30/2024 CLINICAL HISTORY: Vomiting FINDINGS: LINES, TUBES AND DEVICES: Feeding tube in place with tip overlying gastric antrum. BOWEL: Gas within nondilated loops of bowel within the mid abdomen. Nonobstructive bowel gas pattern. SOFT TISSUES: No opaque urinary calculi. BONES: No acute osseous abnormality. IMPRESSION: 1. Feeding tube tip overlies the gastric antrum, appropriate positioning. Electronically signed by: Donnice Mania MD 07/31/2024 01:30 PM EST RP Workstation: [id number]   EEG adult Result Date: 07/31/2024 Gregg Lek, MD     07/31/2024  9:08 AM Patient Name: Lawrence Barnett MRN: [id number] Epilepsy Attending: Lek Gregg Referring Physician/Provider: No ref. provider found     Date: 07/31/2024 Duration: 22 minutes Patient history: Critically sick patient with worsening mental status Level of alertness: Intubated AEDs during EEG study: None Technical aspects: This EEG study was done with scalp electrodes positioned according to the 10-20 International system of electrode placement. Electrical activity was reviewed with band pass filter of 1-70Hz , sensitivity of 7 uV/mm, display speed of 53mm/sec with a 60Hz  notched filter applied as appropriate. EEG data were recorded continuously and digitally stored.  Video monitoring was  available and reviewed as appropriate. Description: EEG showed continuous generalized polymorphic sharply contoured 3 to 6 Hz theta-delta slowing. EEG showed generalized periodic discharges with triphasic morphology at  Hz, more prominent when awake/stimulated. Hyperventilation and photic stimulation were not performed.   ABNORMALITY - Continuous slow, generalized IMPRESSION: This study is suggestive of moderate diffuse brain dysfunction such as encephalopathy, nonspecific etiology but likely related to sedation, toxic-metabolic etiology. No seizures or epileptiform discharges were seen throughout the recording. Lek Gregg MD Neurology    DG CHEST PORT 1 VIEW Result Date: 07/31/2024 EXAM: 1 VIEW(S) XRAY OF THE CHEST 07/31/2024 05:20:05 AM COMPARISON: Portable chest 07/30/2024 04:41 AM. CLINICAL HISTORY: Endotracheal tube present. FINDINGS: LINES, TUBES AND DEVICES: Endotracheal  tube tip is 4.4 cm from the carinal angle. LUNGS AND PLEURA: The lungs are clear of infiltrates. Right upper lobe apical scarring is again shown. No pleural effusion. No pneumothorax. HEART AND MEDIASTINUM: The mediastinum is stable. The cardiac size is normal. BONES AND SOFT TISSUES: Osteopenia. IMPRESSION: 1. Endotracheal tube tip approximately 4.4 cm above the carina, within acceptable position. 2. No acute cardiopulmonary process. 3. Right upper lobe apical scarring, unchanged. Electronically signed by: Francis Quam MD 07/31/2024 06:39 AM EST RP Workstation: [id number]   DG Abd 1 View Result Date: 07/30/2024 EXAM: 1 VIEW XRAY OF THE ABDOMEN 07/30/2024 09:28:00 PM COMPARISON: 07/18/2024 CLINICAL HISTORY: Belly pain. FINDINGS: BOWEL: Nonobstructive bowel gas pattern. SOFT TISSUES: No opaque urinary calculi. BONES: No acute osseous abnormality. IMPRESSION: 1. No acute findings. Electronically signed by: Franky Crease MD 07/30/2024 09:33 PM EST RP Workstation: [id number]   CT HEAD WO CONTRAST ( ) Result Date: 07/30/2024 EXAM:  CT HEAD WITHOUT CONTRAST 07/30/2024 02:06:47 PM TECHNIQUE: CT of the head was performed without the administration of intravenous contrast. Automated exposure control, iterative reconstruction, and/or weight based adjustment of the mA/kV was utilized to reduce the radiation dose to as low as reasonably achievable. COMPARISON: None available. CLINICAL HISTORY: Stroke, follow up FINDINGS: BRAIN AND VENTRICLES: Continued evolution of intraparenchymal hemorrhage in the right thalamus with slightly decreased hyperdense blood products, slightly increased edema and similar mass effect. Slight increase in small volume of intraventricular hemorrhage. Known small infarct better seen on prior MRI. No evidence of new/interval large vascular territory infarct. Similar patchy white matter hypodensities, compatible with chronic microvascular ischemic change. No hydrocephalus. No extra-axial collection. No mass effect or midline shift. ORBITS: No acute abnormality. SINUSES: Opacified left maxillary sinus. SOFT TISSUES AND SKULL: No acute soft tissue abnormality. No skull fracture. IMPRESSION: 1. Continued evolution of intraparenchymal hemorrhage in the right thalamus with slightly decreased hyperdense blood products, slightly increased edema and similar mass effect. 2. Slight increase in small volume of intraventricular hemorrhage. No hydrocephalus. Electronically signed by: Gilmore Molt MD 07/30/2024 02:16 PM EST RP Workstation: [id number]

## 2024-08-01 NOTE — Progress Notes (Addendum)
 STROKE TEAM PROGRESS NOTE   INTERIM HISTORY/SUBJECTIVE No family is at the bedside. Pt still intubated, open eyes on voice with obtundation, did not follow any commands. Not tracking, inconsistent blinking to visual threat. Per RN, pt nephew came by today and pt was able to stick out tongue, wiggling toes on commands.    OBJECTIVE  CBC    Component Value Date/Time   WBC 9.5 08/01/2024 0344   RBC 5.15 08/01/2024 0344   HGB 14.2 08/01/2024 0344   HGB 16.2 10/07/2012 1618   HCT 44.8 08/01/2024 0344   HCT 47.8 10/07/2012 1618   PLT 290 08/01/2024 0344   PLT 306 10/07/2012 1618   MCV 87.0 08/01/2024 0344   MCV 92 10/07/2012 1618   MCH 27.6 08/01/2024 0344   MCHC 31.7 08/01/2024 0344   RDW 15.8 (H) 08/01/2024 0344   RDW 13.9 10/07/2012 1618   LYMPHSABS 2.1 07/14/2024 1904   MONOABS 0.4 07/14/2024 1904   EOSABS 0.1 07/14/2024 1904   BASOSABS 0.0 07/14/2024 1904    BMET    Component Value Date/Time   NA 147 (H) 08/01/2024 0344   NA 139 10/08/2012 0431   K 3.8 08/01/2024 0344   K 4.0 10/08/2012 0431   CL 110 08/01/2024 0344   CL 107 10/08/2012 0431   CO2 28 08/01/2024 0344   CO2 25 10/08/2012 0431   GLUCOSE 105 (H) 08/01/2024 0344   GLUCOSE 133 (H) 10/08/2012 0431   BUN 23 08/01/2024 0344   BUN 10 10/08/2012 0431   CREATININE 0.74 08/01/2024 0344   CREATININE 0.83 10/08/2012 0431   CALCIUM 7.9 (L) 08/01/2024 0344   CALCIUM 9.4 10/08/2012 0431   GFRNONAA >60 08/01/2024 0344   GFRNONAA >60 10/08/2012 0431    IMAGING past 24 hours DG Abd 1 View Result Date: 07/31/2024 EXAM: 1 VIEW XRAY OF THE ABDOMEN 07/31/2024 10:32:59 PM COMPARISON: X-rays earlier today . CLINICAL HISTORY: Emesis. FINDINGS: LINES, TUBES AND DEVICES: Feeding tube tip in the distal stomach. BOWEL: Nonobstructive bowel gas pattern. SOFT TISSUES: No opaque urinary calculi. BONES: No acute osseous abnormality. IMPRESSION: 1. Feeding tube tip terminates in the distal stomach. 2. No acute findings.  Electronically signed by: Norman Gatlin MD 07/31/2024 10:37 PM EST RP Workstation: [id number]    PHYSICAL EXAM  Temp:  [95.7 F (35.4 C)-100.9 F (38.3 C)] 98.2 F (36.8 C) (11/29 1500) Pulse Rate:  [89-203] 108 (11/29 1600) Resp:  [15-53] 17 (11/29 1600) BP: (86-148)/(66-103) 131/93 (11/29 1600) SpO2:  [83 %-100 %] 100 % (11/29 1600) FiO2 (%):  [40 %] 40 % (11/29 1530) Weight:  [62.4 kg] 62.4 kg (11/29 0347)  General - Well nourished, well developed, intubated not on sedation.  Ophthalmologic - fundi not visualized due to noncooperation.  Cardiovascular - Regular rate and rhythm.  Neuro - intubated not on sedation, eyes closed, open on voice today, not able to follow commands. With forced eye opening, eyes in mid position, inconsistently blinking to visual threat, not tracking, pupil 2mm bilateral, sluggish to light. Corneal reflex present R strong than left, gag and cough present. Breathing over the vent.  Facial symmetry not able to test due to ET tube.  Tongue protrusion not cooperative. No spontaneous movement in all extremities, not movement with pain stimulation. Sensation, coordination and gait not tested.    ASSESSMENT/PLAN  Lawrence Barnett is a 73 y.o. male with history of hypertension admitted for left-sided hemiplegia.  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 Stroke: numerous new punctate and small acute to early subacute infarcts, watershed distribution, etiology likely related to hypotension from sepsis Code Stroke CT head 7 mm acute IPH centered at right thalamus with IVH, no hydrocephalus CTA head & neck no LVO or hemodynamically significant stenosis, no vascular abnormality underlying right thalamic hemorrhage  Follow-up head CT slight interval increase in size of right thalamic  hemorrhage, slightly increased IVH MRI 11/13 acute right thalamic IPH with IVH, stable, no evidence of underlying mass Repeat MRI 11/19 - numerous new punctate and small acute to early subacute infarcts predominantly involving the bilateral centrum semiovale and deep white matter, with additional involvement of the bilateral basal ganglia, cerebral lobes, right cerebellum and right insula consistent with multifocal acute ischemia. 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  CT head 11/25 and 11/27 stable evolution of hematoma, mild cerebral edema no significant midline shift.  2D Echo EF 55-60% EEG x 3 encephalopathy, no seizure LDL 73 HgbA1c 5.6 UDS neg VTE prophylaxis - none No antithrombotic prior to admission, now on heparin  IV due to BUE proximal DVT Therapy recommendations:  pending Disposition: Pending, ongoing GOC discussion  Fever with tachycardia Aspiration penumonia Sepsis  Tmax 101.5-100.6 WBC 13.1-14.9-14.8--11.2 Concern for aspiration CXR negative 11/28 Off cefepime  now, changed to meropenem   Respiratory failure Intubated off sedation Not candidate for extubation  CCM for vent management Likely need trach if aggressive care  DVT BUE swelling UE venous doppler showed Left DVT in the left subclavian vein, left axillary vein, left brachial veins, left radial veins and left ulnar veins as well as right axillary vein and right brachial veins  Repeat CT head showed evolving ICH, now on heparin  IV per stroke protocol for PE prevention given proximal DVTs CCM discussed with nephew who agrees with [facility], aware of high bleeding risk   Hypertension Home meds: None BP stable Now on norvasc  10, hydralazine  10 Q6, inderal  15 tid BP goal < 160 Avoid low BP Long-term BP goal normotensive  Lipid management Home meds: None LDL 73, goal < 70 Elevated LFTs AST/ALT 51/70->60/65->67/62->57/53 Hold off statin until LFTs  normalized  Hyperglycemia  CBGs SSI On insulin   AKI Hypernatremia Na 145--148--152-146--143-145 Cre 0.84-0.99-1.35-1.08--0.88--0.82-0.95 free water  300 Q4 on hold and TF @ 50 on hold On D5NS @ 50 Close monitoring  Dysphagia Patient has post-stroke dysphagia On hold TF @ 50 On D5NS @ 50 On hold FW 300 Q4h  Other Stroke Risk Factors Advanced age  Other medical issues Hyperthyroidism - TSH < 0.001, FT4 normal, on methimazole    Hospital day # 18  Neurology will follow peripherally, please feel free to call for any questions or need assistance for family meetings.    Ary Cummins, MD PhD Stroke Neurology 08/01/2024 4:55 PM    To contact Stroke Continuity provider, please refer to [email or web address]. After hours, contact General Neurology

## 2024-08-01 NOTE — Progress Notes (Signed)
 PHARMACY - ANTICOAGULATION CONSULT NOTE  Pharmacy Consult:  Heparin  Indication: Bilateral UE DVTs  Allergies  Allergen Reactions   Clonidine  Other (See Comments)    Weakness, lower extremity paralysis feeling   Penicillins Shortness Of Breath    Patient reports reaction occurred as a teenager and he experienced shortness of breath not requiring hospitalization.    Amlodipine  Swelling   Lisinopril Swelling   Metoprolol Swelling   Tylenol  [Acetaminophen ]     Patient Measurements: Height: 5' 10 (177.8 cm) Weight: 62.4 kg (137 lb 9.1 oz) IBW/kg (Calculated) : 73 HEPARIN  DW (KG): 67.4  Vital Signs: Temp: 98.1 F (36.7 C) (11/29 0700) Temp Source: Esophageal (11/29 0400) BP: 132/87 (11/29 0700) Pulse Rate: 112 (11/29 0700)  Labs: Recent Labs    07/30/24 0452 07/30/24 1255 07/30/24 2030 07/31/24 0701 07/31/24 0949 08/01/24 0344  HGB 14.9  --   --  16.2  --  14.2  HCT 46.9  --   --  51.7  --  44.8  PLT 370  --   --  320  --  290  LABPROT  --   --   --   --   --  17.2*  INR  --   --   --   --   --  1.3*  HEPARINUNFRC 0.35 0.42  --  0.47  --  0.38  CREATININE 0.88  --    < > 0.95 0.93 0.74  TROPONINIHS  --   --   --   --  282*  --    < > = values in this interval not displayed.    Estimated Creatinine Clearance: 70.4 mL/min (by C-G formula based on SCr of 0.74 mg/dL).   Medical History: Past Medical History:  Diagnosis Date   Hypertension      Assessment: 66 YOM presented with ICH and thyroid  storm.  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  Repeat CT on 11/25 showed evolution of R thalamic hemorrhage with mild edema and decreased IVH.  Pharmacy consulted to dose IV heparin  using stroke protocol.    Heparin  level 0.38 (therapeutic) on infusion at 800 units/hr. No bleeding noted.  Goal of Therapy:  Heparin  level 0.3-0.5 units/ml Monitor platelets by anticoagulation protocol: Yes   Plan:  Continue heparin  at 800 units/hr Daily heparin  level and  CBC  Vermell Mccallum, PharmD

## 2024-08-01 NOTE — Plan of Care (Signed)
  Problem: Coping: Goal: Will verbalize positive feelings about self Outcome: Not Progressing   Problem: Nutrition: Goal: Risk of aspiration will decrease Outcome: Not Progressing   Problem: Coping: Goal: Level of anxiety will decrease Outcome: Not Progressing   Problem: Elimination: Goal: Will not experience complications related to bowel motility Outcome: Not Progressing

## 2024-08-02 LAB — BLOOD CULTURE ID PANEL (REFLEXED) - BCID2

## 2024-08-02 LAB — COMPREHENSIVE METABOLIC PANEL WITH GFR
ALT: 65 U/L — ABNORMAL HIGH (ref 0–44)
AST: 71 U/L — ABNORMAL HIGH (ref 15–41)
Albumin: 1.9 g/dL — ABNORMAL LOW (ref 3.5–5.0)
Alkaline Phosphatase: 422 U/L — ABNORMAL HIGH (ref 38–126)
Anion gap: 9 (ref 5–15)
BUN: 20 mg/dL (ref 8–23)
CO2: 25 mmol/L (ref 22–32)
Calcium: 7.8 mg/dL — ABNORMAL LOW (ref 8.9–10.3)
Chloride: 108 mmol/L (ref 98–111)
Creatinine, Ser: 0.62 mg/dL (ref 0.61–1.24)
GFR, Estimated: >60 mL/min (ref >60)
Glucose, Bld: 134 mg/dL — ABNORMAL HIGH (ref 70–99)
Potassium: 3.6 mmol/L (ref 3.5–5.1)
Sodium: 142 mmol/L (ref 135–145)
Total Bilirubin: 1.4 mg/dL — ABNORMAL HIGH (ref 0.0–1.2)
Total Protein: 5.6 g/dL — ABNORMAL LOW (ref 6.5–8.1)

## 2024-08-02 LAB — PHOSPHORUS: Phosphorus: 1.7 mg/dL — ABNORMAL LOW (ref 2.5–4.6)

## 2024-08-02 LAB — CBC
HCT: 44.3 % (ref 39.0–52.0)
Hemoglobin: 14 g/dL (ref 13.0–17.0)
MCH: 27.5 pg (ref 26.0–34.0)
MCHC: 31.6 g/dL (ref 30.0–36.0)
MCV: 86.9 fL (ref 80.0–100.0)
Platelets: 265 K/uL (ref 150–400)
RBC: 5.1 MIL/uL (ref 4.22–5.81)
RDW: 15.8 % — ABNORMAL HIGH (ref 11.5–15.5)
WBC: 7 K/uL (ref 4.0–10.5)
nRBC: 0 % (ref 0.0–0.2)

## 2024-08-02 LAB — GLUCOSE, CAPILLARY
Glucose-Capillary: 129 mg/dL — ABNORMAL HIGH (ref 70–99)
Glucose-Capillary: 120 mg/dL — ABNORMAL HIGH (ref 70–99)
Glucose-Capillary: 132 mg/dL — ABNORMAL HIGH (ref 70–99)
Glucose-Capillary: 157 mg/dL — ABNORMAL HIGH (ref 70–99)
Glucose-Capillary: 62 mg/dL — ABNORMAL LOW (ref 70–99)
Glucose-Capillary: 91 mg/dL (ref 70–99)
Glucose-Capillary: 77 mg/dL (ref 70–99)

## 2024-08-02 LAB — MAGNESIUM: Magnesium: 2 mg/dL (ref 1.7–2.4)

## 2024-08-02 LAB — AMYLASE: Amylase: 157 U/L — ABNORMAL HIGH (ref 28–100)

## 2024-08-02 LAB — HEPARIN LEVEL (UNFRACTIONATED): Heparin Unfractionated: 0.42 [IU]/mL (ref 0.30–0.70)

## 2024-08-02 MED ORDER — CEFAZOLIN SODIUM-DEXTROSE 2-4 GM/100ML-% IV SOLN
2.0000 g | Freq: Three times a day (TID) | INTRAVENOUS | Status: DC
  Administered 2024-08-02 – 2024-08-03 (×4): 2 g via INTRAVENOUS
  Filled 2024-08-02 (×5): qty 100

## 2024-08-02 MED ORDER — DEXTROSE-SODIUM CHLORIDE 5-0.9 % IV SOLN: INTRAVENOUS | Status: AC

## 2024-08-02 MED ORDER — POTASSIUM PHOSPHATES 15 MMOLE/5ML IV SOLN
30.0000 mmol | Freq: Once | INTRAVENOUS | Status: AC
  Administered 2024-08-02: 30 mmol via INTRAVENOUS
  Filled 2024-08-02: qty 10

## 2024-08-02 MED ORDER — DIPHENHYDRAMINE HCL 50 MG/ML IJ SOLN: 25.0000 mg | Freq: Three times a day (TID) | INTRAMUSCULAR | Status: DC | PRN

## 2024-08-02 MED ORDER — POTASSIUM PHOSPHATES 15 MMOLE/5ML IV SOLN: 15.0000 mmol | Freq: Once | INTRAVENOUS | Status: DC

## 2024-08-02 MED ORDER — POTASSIUM & SODIUM PHOSPHATES 280-160-250 MG PO PACK
2.0000 | PACK | ORAL | Status: DC
  Administered 2024-08-02 (×2): 2
  Filled 2024-08-02 (×2): qty 2

## 2024-08-02 NOTE — Progress Notes (Signed)
 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 Pt was progressing thru his hospitalization with a superimposed infection 2/2 suspected aspiration pneumonia being empirically treated.    Pt's mental status declined yesterday evening prompting overnight evaluation via RRT. Pt was reportedly found obtunded, tachycardic and tachypneic. VSS however. Abx were broadened, pt was given fluids, and stat cth revealing mildly increased ventricular size concerning for mildly progressive hydrocephalus. Neuro was reached out to who recommended repeat cth to eval progression further at 0800.    Pt's labs at 0200 this am revealed multiple areas for improvement that could contribute to pt's mental status as well. Thru the course of the day however it appears that pt's mental status did not improve, bicarb/renal function/ammonia/lactate/sodium have been worsening as well. Cth has yet to be complete.    Ccm was consulted 11/16 PM as pt remains obtunded and req ICU transfer.  Intubated, on peripheral pressors, multiple abnormal labs  Pertinent  Medical History  Htn  Significant Hospital Events: Including procedures, antibiotic start and stop dates in addition to other pertinent events   11/11 admitted to ICU  11/13 transferred to floor  11/16 CCM consulted and transferred back to ICU 11/16, intubated, on peripheral pressors 11/17-did tolerate, mental status data remain poor 11/18-tolerated weaning pressor support Tolerating weaning, remains encephalopathic11/21 11/22 remains encephalopathic, on full vent support 11/23 encephalopathic - Has remained off sedation . No significant overnight events. Not responsive to  commands  07/27/24 - afebrile . On vent, fio2 30%. On Tube Feed. Niece Randine at bedside. 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 Says patient was never married and no kids. Live in [place] and moved to [place] 5y ago to be near sister . Now sister is in a facility. No overnight issues. They understand trach is an option in their care but reports no decision made TSHJ < 0.1 (same as 07/19/24) FT4 normalized 0.93 (was 2.3 and high 07/19/24)  07/28/24: Did PSV. 30% fio2 on vent. On TF. Afebrile.  Per RN he has followed commands and is awake  with just weakness on right side. Per niece at tbedside -mental status is waxing and waning. Niece is not ready to decide on trach UE DVT - > CT head with natural progresson of bleed -> Niece not decided yet on IV heparin  but rcommended by stroke team Thyroid  meds: reduced Hydrocort  and Inderal  dosing EEG - metabolic encephaloathy 07/29/24 - remains on vent. 40%. Afebrile v 84F . Per RN followed commands yesterday but this morning remains comatose though did have gag (both to RN , niece and MD). Niece gave verbal consent for IV heparin . Niece not readyy for trach  IV Heparing gtt started for UE DVT still on lasix , looks dry but for UE edema which is DVT. CXR is dry ThyroidL: INderal  stopped and HC reduced OBtunded but has gag iPAL with niece - undecided on code status and trach 11/.27/25:  new fever now but wbc better. PEr RN - obtunded and might withdraw to deep pain on RUE and LLE -very minimal Softer BP / condtinued obtundation - > releat Head CT: continued evolution of intraparenchymal hemorrhage in the right thalamus with slightly decreased hyperdense  blood products, slightly increased edema and similar mass effect.  Slight increase in small volume of intraventricular hemorrhage. No hydrocephalus. -> heoarin restarted Long actiing insulin  held Cultures MRSA PCR neg Trach aspirate - GREW MSSA on 08/02/24 Blood culture UA abnroma  -+ 11/27- TSH < 0.1/FT4 normal. ET tbu 4.4cm above carina. Febrile., rising wbc. Overnight vomited -> TF held -> hypogluycemic . Also had  NSVTACH at time of vomit.  Cefepime  stared by eMD. This AM LFT abnormal with rising bilribuin. Post family uodate - Low BP and has mottling LE. D/w Dr Jerri - feels CT head stable but overall ppor prognosis  S/p CVL R internal jugular Lioase  81 and < 2X ULN , AMUYLASE Also up. RUQ US   -> Circumferential gallbladder wall thickening with wall edema, which in the absence of stones and pericholecystic fluid, may be due to acute hepatitis, chronic liver disease, upper abdominal inflammation, or volume overload from either CHF or renal failure.  iPAL by NEURO - full code. Nephew is DPOA but defers decision to niece. Niece does not want to consider trach (commit v not do) till at least 3 weeks post intubation Changed tO MERREM  08/01/24 - trach aspirate growing staph. Remains on IV heparin  gtt. Off levophed , On Vent  Fio2 40%. FEbrile since 07/29/24. Also vomited for 2nd day in a row at evenign wwith trickle feeds. LFT better. Remains obtunded. Mottlin improved but has hives (pre Merrem, psost cefepime ) Per RN followe commmand for nephew (did not for neurology) Restatt Tricke feeds Wound care consult - DTI a HAPI progressing to Unstageable  Interim History / Subjective:   08/02/24- fio2 40%,  fever contues v curve reducing. WBC is normal. Trach culture 11/27 with MSSA. On Vent. On IV heparin .  Not on sedation gtt. Tolerating Trickle TF. K and phos low today.  REmains deeply Comatose. Not responsive  Objective    Blood pressure 101/63, pulse (!) 113, temperature 98.6 F (37 C), resp. rate (!) 21, height 5' 10 (1.778 m), weight 62.8 kg, SpO2 98%.    Vent Mode: PSV;CPAP FiO2 (%):  [40 %] 40 % Set Rate:  [14 bmp] 14 bmp Vt Set:  [440 mL] 440 mL PEEP:  [5 cmH20] 5 cmH20 Pressure Support:  [8 cmH20-12 cmH20] 8 cmH20 Plateau Pressure:  [17 cmH20] 17 cmH20    Intake/Output Summary (Last 24 hours) at 08/02/2024 9071 Last data filed at 08/02/2024 0800 Gross per 24 hour  Intake 4064.1 ml  Output 1700 ml  Net 2364.1 ml   Filed Weights   07/31/24 0500 08/01/24 0347 08/02/24 0328  Weight: 63.5 kg 62.4 kg 62.8 kg    Examination: General Appearance:  Looks criticall ill OBESE - no Head:  Normocephalic, without obvious abnormality, atraumatic Eyes:  PERRL - yes, conjunctiva/corneas - muddy     Ears:  Normal external ear canals, both ears Nose:  G tube - PANDA Throat:  ETT TUBE - yes , OG tube - no Neck:  Supple,  No enlargement/tenderness/nodules Lungs: Clear to auscultation bilaterally, Ventilator   Synchrony - yes Heart:  S1 and S2 normal, no murmur, CVP - x.  Pressors - no Abdomen:  Soft, no masses, no organomegaly Genitalia / Rectal:  Not done Extremities:  Extremities- LUE > RUE edema Skin:  ntact in exposed areas . Sacral area - DECUB +. Developed post hospitaliztaion  Neurologic:  Sedation - none -> RASS - -4 . Moves all 4s - x. CAM-ICU - x . Orientation - NOT  Resolved problem list  AKI High Na SEptic shock 07/31/24 -  off pressors since 08/01/24 Assessment and Plan   Intraparenchymal hemorrhage with IVH and hydrocephalus Multiple watershed strokes - CT 11/25 with improved hge -  EEG 11/26 with metabolic encephalopathy  11/30 -  Off lactulose  since 07/29/24. CT head 11/27 - stable per Dr JERRI.  Remains COMATOSE/OBTUNDED for RN/CCM/Neuro MD exam though followed commands for frmaily 08/01/24  PLAN - Neuroprotective measures- normothermia, euglycemia, HOB greater than 30, head in neutral alignment, normocapnia, normoxia -Neurology continues to follow - monitor without LACTULOSE  - check ammonia 08/06/24   Acute Respiratory Failure - intubated 07/19/24  08/02/2024 - > does not meet criteria for SBT/Extubation in setting of Acute Respiratory Failure due to coma    Plan - No extubation till more awake - ongoing  trach/ltac conversations  - niece 07/29/24 feels that patient needs more time in ICU with/without EET tube before any decision on trach is made (recent example of her mom who survived sepss)   Hypernatremia  - 08/02/24 - Na NORMAL  Plan -  free water  to 300mL Q4h ->to ciontinue - monitor off lasix  since 07/29/24   Edema UE despite lasix  - noted 07/28/24 UE DVT Left side diagnosed 07/28/24  11/30 - tolerating IV heparin  gtt. No overt bleeding. CT head 11/27 stable  Plan  IV heparin  gtt from 07/29/24 (post niece consent) - > neuro to indicate if CT head shows worsening bleed Need to deciide on DOAC baesd on goals of care and trach timing egc   Hyperthyroidism -On methimazole , propranolol , steroids; - stopped inderal  07/29/24 - reduced HC 07/30/24  11/25: -T4 better; TSH will take a while to improve   Plan  -monito off hydrocort  08/01/24 - monitor off inderal  since 11/26/254 - continue methimazole   - continue Potassium Iodide  but rduce dose 07/29/24  Nutrition Possible mild pancreatitis 07/31/24  - vomited 11/27 and 11/28 ? Due to sepsis v mild pancrateie.  But tolreating trickled feeds as of 08/02/24  plan - Continue tube feeds at trickle and slowly escalate   Maxillary sinusitis; last dose 07/28/24 AM Concern for sepsis again 07/30/24 -MSSA VAP (had hives with cefepime )  08/02/24 - FEve slowly improving?  Plan - Narrow abx - d/w Pharmacy - options ? Doxy - Merrme 11/28 -   xxxxx - Cefepime  11/28 - 11/28 (RASH) - Doxy 11/20 - 11/24 - CEfepime  11/15 - 11/20 - Linezolid  11/16 - 11/17 - Vanc 11/15 - 11/17 - Flagyl  11/15 - 11/17 - cEftriaxone  11/15 - 11/15     Abnormal LFT - imprvoing 11/29 New Hyper bilribuininea 07/31/24 - resolved 08/01/24 New Vomoit 07/31/24 x 2 with trickle  ? Mild pancratitis 11/28 - KUB normal  -toeraing trickle. BIli better 08/02/24  Plan  - increase trickle feeds  - recheck LFT with INR   Hypertension  11/26 - off  inderal  for thyrptoxicosis 11/27 - off amlodpine due to soft bp   Plan  - monitor off inderal  and amlodipine   HIVES onsett 07/31/24 - post cefepime , pre-Merrem   11/30 improving  Plan  -consider Cefepime  as allergy    HYPOGLYCEMIA  - new 07/31/24 after vomit and TF held  - 11/30 normal sugars  Plan  D5 at 50cc -> to kvo;l  increas if needed  SACRAL DECUB  11/28 per wound care - DTI going to unstageable  Plan - per wound care  FAMILY  Updated niece at bedside 07/24/2024: CCM  start discussing medium term program plans which may mean  you will need a tracheostomy and a PEG, will take it a day at a time     11/24 - Niece Randine updated at bedside 07/28/24 [place] Randine updated. Goals of care in progress 07/29/24 - iPAL done - full code. Recommended to niece to discuss/consider DNR with nephew. Niece indicated patient needs some more time in ICU before decision on trach 11/27 - none at bedside 11/28 - called Dwayne Carmin over phone: updated-> she is coming later today.IPAL done  by neuro 11/29 - Niece Harbert Fitterer - > updated over phone 11/30 - Niece Tacey Loder update over phone including presence of DTI     ATTESTATION & SIGNATURE   The patient Grantley D Aiken is critically ill with multiple organ systems failure and requires high complexity decision making for assessment and support, frequent evaluation and titration of therapies, application of advanced monitoring technologies and extensive interpretation of multiple databases and discussion with other appropriate health care personnel such as bedside nurses, social workers, case production designer, theatre/television/film, consultants, respiratory therapists, nutritionists, secretaries etc.,  Critical care time includes but is not restricted to just documentation time. Documentation can happen in parallel or sequential to care time depending on case mix urgency and priorities for the shift. So, overall critical Care Time devoted to patient care services  described in this note is  30  Minutes.   This time reflects time of care of this signee Dr Dorethia Cave which includ does not reflect procedure time, or teaching time or supervisory time of PA/NP/Med student/Med Resident etc but could involve care discussion time     Dr. Dorethia Cave, M.D., [facility].C.P Pulmonary and Critical Care Medicine Staff Physician, [facility] Pager: [phone], If no answer or between  15:00h - 7:00h: call [phone]  08/02/2024 10:00 AM   LABS    PULMONARY No results for input(s): PHART, PCO2ART, PO2ART, HCO3, TCO2, O2SAT in the last 168 hours.  Invalid input(s): PCO2, PO2   CBC Recent Labs  Lab 07/31/24 0701 08/01/24 0344 08/02/24 0313  HGB 16.2 14.2 14.0  HCT 51.7 44.8 44.3  WBC 14.7* 9.5 7.0  PLT 320 290 265    COAGULATION Recent Labs  Lab 08/01/24 0344  INR 1.3*    CARDIAC  No results for input(s): TROPONINI in the last 168 hours. No results for input(s): PROBNP in the last 168 hours.   CHEMISTRY Recent Labs  Lab 07/28/24 0533 07/29/24 0448 07/30/24 0452 07/30/24 2030 07/31/24 0701 07/31/24 0949 08/01/24 0344 08/02/24 0313  NA 146*   < > 146* 143 145 146* 147* 142  K 3.9   < > 4.3 4.6 5.5* 5.0 3.8 3.6  CL 109   < > 105 107 107 106 110 108  CO2 25   < > 29 27 24 25 28 25   GLUCOSE 233*   < > 132* 80 71 100* 105* 134*  BUN 37*   < > 38* 32* 33* 32* 23 20  CREATININE 0.88   < > 0.88 0.82 0.95 0.93 0.74 0.62  CALCIUM 8.6*   < > 8.4* 8.2* 8.1* 8.1* 7.9* 7.8*  MG 2.1  --  1.9 2.1 2.3  --  2.0 2.0  PHOS 2.5  --  2.9  --  4.2  --   --  1.7*   < > = values in this interval not displayed.   Estimated Creatinine Clearance: 70.9 mL/min (by C-G formula based on SCr of 0.62  mg/dL).   LIVER Recent Labs  Lab 07/28/24 0533 07/30/24 0452 07/31/24 0701 08/01/24 0344 08/02/24 0313  AST 51* 60* 67* 57* 71*  ALT 70* 65* 62* 53* 65*  ALKPHOS 177* 189*  223* 208* 422*  BILITOT 0.7 0.8 1.8* 1.4* 1.4*  PROT 6.3* 6.2* 6.3* 5.7* 5.6*  ALBUMIN 2.1* 2.1* 2.2* 1.9* 1.9*  INR  --   --   --  1.3*  --      INFECTIOUS Recent Labs  Lab 07/30/24 0844 07/31/24 0949 07/31/24 1511  LATICACIDVEN 1.6 5.2* 1.2  PROCALCITON 0.27 2.50  --       ENDOCRINE CBG (last 3)  Recent Labs    08/01/24 2334 08/02/24 0324 08/02/24 0720  GLUCAP 105* 129* 120*         IMAGING x48h  - image(s) personally visualized  -   highlighted in bold DG Abd 1 View Result Date: 07/31/2024 EXAM: 1 VIEW XRAY OF THE ABDOMEN 07/31/2024 10:32:59 PM COMPARISON: X-rays earlier today . CLINICAL HISTORY: Emesis. FINDINGS: LINES, TUBES AND DEVICES: Feeding tube tip in the distal stomach. BOWEL: Nonobstructive bowel gas pattern. SOFT TISSUES: No opaque urinary calculi. BONES: No acute osseous abnormality. IMPRESSION: 1. Feeding tube tip terminates in the distal stomach. 2. No acute findings. Electronically signed by: Norman Gatlin MD 07/31/2024 10:37 PM EST RP Workstation: [id number]   US  Abdomen Limited RUQ (LIVER/GB) Result Date: 07/31/2024 CLINICAL DATA:  [id number] Vomiting [id number] , elevated serum bilirubin EXAM: ULTRASOUND ABDOMEN LIMITED RIGHT UPPER QUADRANT COMPARISON:  07/31/2024, July 20, 2024 FINDINGS: Gallbladder: No gallstones. Diffuse circumferential gallbladder wall thickening with wall edema, overall measuring 7 mm thick. No pericholecystic fluid. Unable to evaluate for a sonographic Murphy's sign as the patient was intubated and sedated during the exam. 3 x 4 mm echogenicity along the nondependent gallbladder wall, possibly adherent biliary sludge or a small gallbladder polyp. Common bile duct: Diameter: 4 mm Liver: Normal echogenicity. No focal lesion identified. No intrahepatic biliary ductal dilation. Portal vein is patent on color Doppler imaging with normal direction of blood flow towards the liver. Other: None. IMPRESSION: Circumferential gallbladder wall  thickening with wall edema, which in the absence of stones and pericholecystic fluid, may be due to acute hepatitis, chronic liver disease, upper abdominal inflammation, or volume overload from either CHF or renal failure. Laboratory correlation recommended. Electronically Signed   By: Rogelia Myers M.D.   On: 07/31/2024 14:59   DG CHEST PORT 1 VIEW Result Date: 07/31/2024 CLINICAL DATA:  [id number] Encounter for central line placement [id number] EXAM: PORTABLE CHEST - 1 VIEW COMPARISON:  07/31/2024 5 a.m. FINDINGS: Endotracheal tube in place terminating in the lower trachea. Esophageal temperature probe noted in the mid upper esophagus. Weighted feeding tube courses below the diaphragm, extending outside the field of view. Interval placement of a right IJ central venous catheter, which terminates in the lower SVC. Biapical pleural thickening. No focal airspace consolidation, pleural effusion, or pneumothorax. No cardiomegaly. Tortuous aorta with aortic atherosclerosis. No acute fracture or destructive lesions. Multilevel thoracic osteophytosis. IMPRESSION: Interval placement of a right IJ approach central venous catheter, which terminates in the lower SVC. No pneumothorax. Electronically Signed   By: Rogelia Myers M.D.   On: 07/31/2024 14:51

## 2024-08-02 NOTE — Progress Notes (Signed)
 PHARMACY - PHYSICIAN COMMUNICATION CRITICAL VALUE ALERT - BLOOD CULTURE IDENTIFICATION (BCID)  Lawrence Barnett is an 73 y.o. male who presented to [facility] on 07/14/2024 with a chief complaint of Brain Bleed  Assessment:  1 of 4 bottles GPC staph epi with no resistance mechanisms. Likely contaminant.  Name of physician (or Provider) Contacted: Ramaswamy  Current antibiotics: Cefazolin  Changes to prescribed antibiotics recommended:  No changes.  Results for orders placed or performed during the hospital encounter of 07/14/24  Blood Culture ID Panel (Reflexed) (Collected: 07/30/2024  8:46 AM)  Result Value Ref Range   Enterococcus faecalis NOT DETECTED NOT DETECTED   Enterococcus Faecium NOT DETECTED NOT DETECTED   Listeria monocytogenes NOT DETECTED NOT DETECTED   Staphylococcus species DETECTED (A) NOT DETECTED   Staphylococcus aureus (BCID) NOT DETECTED NOT DETECTED   Staphylococcus epidermidis DETECTED (A) NOT DETECTED   Staphylococcus lugdunensis NOT DETECTED NOT DETECTED   Streptococcus species NOT DETECTED NOT DETECTED   Streptococcus agalactiae NOT DETECTED NOT DETECTED   Streptococcus pneumoniae NOT DETECTED NOT DETECTED   Streptococcus pyogenes NOT DETECTED NOT DETECTED   A.calcoaceticus-baumannii NOT DETECTED NOT DETECTED   Bacteroides fragilis NOT DETECTED NOT DETECTED   Enterobacterales NOT DETECTED NOT DETECTED   Enterobacter cloacae complex NOT DETECTED NOT DETECTED   Escherichia coli NOT DETECTED NOT DETECTED   Klebsiella aerogenes NOT DETECTED NOT DETECTED   Klebsiella oxytoca NOT DETECTED NOT DETECTED   Klebsiella pneumoniae NOT DETECTED NOT DETECTED   Proteus species NOT DETECTED NOT DETECTED   Salmonella species NOT DETECTED NOT DETECTED   Serratia marcescens NOT DETECTED NOT DETECTED   Haemophilus influenzae NOT DETECTED NOT DETECTED   Neisseria meningitidis NOT DETECTED NOT DETECTED   Pseudomonas aeruginosa NOT DETECTED NOT DETECTED   Stenotrophomonas  maltophilia NOT DETECTED NOT DETECTED   Candida albicans NOT DETECTED NOT DETECTED   Candida auris NOT DETECTED NOT DETECTED   Candida glabrata NOT DETECTED NOT DETECTED   Candida krusei NOT DETECTED NOT DETECTED   Candida parapsilosis NOT DETECTED NOT DETECTED   Candida tropicalis NOT DETECTED NOT DETECTED   Cryptococcus neoformans/gattii NOT DETECTED NOT DETECTED   Methicillin resistance mecA/C NOT DETECTED NOT DETECTED    [name] 08/02/2024  4:45 PM

## 2024-08-02 NOTE — Progress Notes (Signed)
 [facility] ADULT ICU REPLACEMENT PROTOCOL   The patient does apply for the [facility] Adult ICU Electrolyte Replacment Protocol based on the criteria listed below:   1.Exclusion criteria: TCTS, ECMO, Dialysis, and Myasthenia Gravis patients 2. Is GFR >/= 30 ml/min? Yes.    Patient's GFR today is >60 3. Is SCr </= 2? Yes.   Patient's SCr is 0.62 mg/dL 4. Did SCr increase >/= 0.5 in 24 hours? No. 5.Pt's weight >40kg  Yes.   6. Abnormal electrolyte(s): Phos 1.7, K+ 3.6  7. Electrolytes replaced per protocol 8.  Call MD STAT for K+ </= 2.5, Phos </= 1, or Mag </= 1 Physician:  Dr. Rober Rummer, Rec[place] ORN 08/02/2024 5:29 AM

## 2024-08-02 NOTE — Plan of Care (Signed)
  Problem: Nutrition: Goal: Adequate nutrition will be maintained Outcome: Progressing   Problem: Self-Care: Goal: Verbalization of feelings and concerns over difficulty with self-care will improve Outcome: Not Progressing   Problem: Nutrition: Goal: Risk of aspiration will decrease Outcome: Not Progressing   Problem: Activity: Goal: Risk for activity intolerance will decrease Outcome: Not Progressing

## 2024-08-02 NOTE — Progress Notes (Signed)
 PHARMACY - ANTICOAGULATION CONSULT NOTE  Pharmacy Consult:  Heparin  Indication: Bilateral UE DVTs  Allergies  Allergen Reactions   Clonidine  Other (See Comments)    Weakness, lower extremity paralysis feeling   Penicillins Shortness Of Breath    Patient reports reaction occurred as a teenager and he experienced shortness of breath not requiring hospitalization.    Amlodipine  Swelling   Lisinopril Swelling   Metoprolol Swelling   Tylenol  [Acetaminophen ]    Cefepime  Hives    Patient had new hives on trunk and upper legs following 1 dose of cefepime  relieved by IV benadryl . On vent, but no increased Fio2 needed.     Patient Measurements: Height: 5' 10 (177.8 cm) Weight: 62.8 kg (138 lb 7.2 oz) IBW/kg (Calculated) : 73 HEPARIN  DW (KG): 67.4  Vital Signs: Temp: 99.7 F (37.6 C) (11/30 1111) Temp Source: Esophageal (11/30 0400) BP: 89/73 (11/30 1111) Pulse Rate: 113 (11/30 1111)  Labs: Recent Labs    07/31/24 0701 07/31/24 0949 08/01/24 0344 08/02/24 0313  HGB 16.2  --  14.2 14.0  HCT 51.7  --  44.8 44.3  PLT 320  --  290 265  LABPROT  --   --  17.2*  --   INR  --   --  1.3*  --   HEPARINUNFRC 0.47  --  0.38 0.42  CREATININE 0.95 0.93 0.74 0.62  TROPONINIHS  --  282*  --   --     Estimated Creatinine Clearance: 70.9 mL/min (by C-G formula based on SCr of 0.62 mg/dL).   Medical History: Past Medical History:  Diagnosis Date   Hypertension      Assessment: 34 YOM presented with ICH and thyroid  storm.  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  Repeat CT on 11/25 showed evolution of R thalamic hemorrhage with mild edema and decreased IVH.  Pharmacy consulted to dose IV heparin  using stroke protocol.    Heparin  level 0.42 (therapeutic) on infusion at 800 units/hr. No bleeding noted. CBC stable.  Goal of Therapy:  Heparin  level 0.3-0.5 units/ml Monitor platelets by anticoagulation protocol: Yes   Plan:  Continue heparin  at 800 units/hr Daily heparin   level and CBC  Larraine Brazier, PharmD Clinical Pharmacist 08/02/2024  11:28 AM **Pharmacist phone directory can now be found on [email or web address] ([email or web address]).  Listed under [facility].

## 2024-08-03 LAB — BASIC METABOLIC PANEL WITH GFR
Anion gap: 7 (ref 5–15)
BUN: 19 mg/dL (ref 8–23)
CO2: 25 mmol/L (ref 22–32)
Calcium: 7.6 mg/dL — ABNORMAL LOW (ref 8.9–10.3)
Chloride: 106 mmol/L (ref 98–111)
Creatinine, Ser: 0.61 mg/dL (ref 0.61–1.24)
GFR, Estimated: >60 mL/min (ref >60)
Glucose, Bld: 157 mg/dL — ABNORMAL HIGH (ref 70–99)
Potassium: 4 mmol/L (ref 3.5–5.1)
Sodium: 138 mmol/L (ref 135–145)

## 2024-08-03 LAB — COMPREHENSIVE METABOLIC PANEL WITH GFR
ALT: 46 U/L — ABNORMAL HIGH (ref 0–44)
AST: 50 U/L — ABNORMAL HIGH (ref 15–41)
Albumin: 1.7 g/dL — ABNORMAL LOW (ref 3.5–5.0)
Alkaline Phosphatase: 439 U/L — ABNORMAL HIGH (ref 38–126)
Anion gap: 9 (ref 5–15)
BUN: 18 mg/dL (ref 8–23)
CO2: 25 mmol/L (ref 22–32)
Calcium: 7.6 mg/dL — ABNORMAL LOW (ref 8.9–10.3)
Chloride: 107 mmol/L (ref 98–111)
Creatinine, Ser: 0.6 mg/dL — ABNORMAL LOW (ref 0.61–1.24)
GFR, Estimated: >60 mL/min (ref >60)
Glucose, Bld: 152 mg/dL — ABNORMAL HIGH (ref 70–99)
Potassium: 4 mmol/L (ref 3.5–5.1)
Sodium: 141 mmol/L (ref 135–145)
Total Bilirubin: 0.8 mg/dL (ref 0.0–1.2)
Total Protein: 5.1 g/dL — ABNORMAL LOW (ref 6.5–8.1)

## 2024-08-03 LAB — MAGNESIUM
Magnesium: 1.8 mg/dL (ref 1.7–2.4)
Magnesium: 1.8 mg/dL (ref 1.7–2.4)

## 2024-08-03 LAB — CBC
HCT: 41.5 % (ref 39.0–52.0)
Hemoglobin: 13.2 g/dL (ref 13.0–17.0)
MCH: 27.6 pg (ref 26.0–34.0)
MCHC: 31.8 g/dL (ref 30.0–36.0)
MCV: 86.6 fL (ref 80.0–100.0)
Platelets: 248 K/uL (ref 150–400)
RBC: 4.79 MIL/uL (ref 4.22–5.81)
RDW: 15.8 % — ABNORMAL HIGH (ref 11.5–15.5)
WBC: 7.4 K/uL (ref 4.0–10.5)
nRBC: 0 % (ref 0.0–0.2)

## 2024-08-03 LAB — GLUCOSE, CAPILLARY
Glucose-Capillary: 75 mg/dL (ref 70–99)
Glucose-Capillary: 105 mg/dL — ABNORMAL HIGH (ref 70–99)
Glucose-Capillary: 105 mg/dL — ABNORMAL HIGH (ref 70–99)
Glucose-Capillary: 78 mg/dL (ref 70–99)
Glucose-Capillary: 59 mg/dL — ABNORMAL LOW (ref 70–99)
Glucose-Capillary: 95 mg/dL (ref 70–99)

## 2024-08-03 LAB — HEPARIN LEVEL (UNFRACTIONATED): Heparin Unfractionated: 0.24 [IU]/mL — ABNORMAL LOW (ref 0.30–0.70)

## 2024-08-03 LAB — CULTURE, BLOOD (ROUTINE X 2)

## 2024-08-03 LAB — AMMONIA: Ammonia: 30 umol/L (ref 9–35)

## 2024-08-03 LAB — PROTIME-INR
INR: 1.1 (ref 0.8–1.2)
Prothrombin Time: 14.9 s (ref 11.4–15.2)

## 2024-08-03 LAB — PHOSPHORUS: Phosphorus: 2.6 mg/dL (ref 2.5–4.6)

## 2024-08-03 MED ORDER — ATROPINE SULFATE 1 MG/10ML IJ SOSY
PREFILLED_SYRINGE | INTRAMUSCULAR | Status: DC
  Filled 2024-08-03: qty 20

## 2024-08-03 MED ORDER — MAGNESIUM SULFATE 2 GM/50ML IV SOLN
2.0000 g | Freq: Once | INTRAVENOUS | Status: AC
  Administered 2024-08-03: 2 g via INTRAVENOUS
  Filled 2024-08-03: qty 50

## 2024-08-03 MED ORDER — COLLAGENASE 250 UNIT/GM EX OINT
TOPICAL_OINTMENT | Freq: Every day | CUTANEOUS | Status: DC
  Administered 2024-08-03: 1 via TOPICAL
  Filled 2024-08-03: qty 30

## 2024-08-03 MED ORDER — DEXTROSE 50 % IV SOLN
INTRAVENOUS | Status: AC
  Administered 2024-08-03: 12.5 g via INTRAVENOUS
  Filled 2024-08-03: qty 50

## 2024-08-03 MED ORDER — ATROPINE SULFATE 1 MG/10ML IJ SOSY
PREFILLED_SYRINGE | INTRAMUSCULAR | Status: DC
  Filled 2024-08-03: qty 10

## 2024-08-03 MED ORDER — DEXTROSE 50 % IV SOLN: 12.5000 g | INTRAVENOUS | Status: AC

## 2024-08-03 DEATH — deceased

## 2024-08-04 LAB — CULTURE, BLOOD (ROUTINE X 2)
Culture: NO GROWTH
Special Requests: ADEQUATE

## 2024-09-03 NOTE — Death Summary Note (Signed)
DEATH SUMMARY   Patient Details  Name: Lawrence Barnett MRN: [id number] DOB: 06/06/1951  Admission/Discharge Information   Admit Date:  08/07/2024  Date of Death: Date of Death: 08/27/2024  Time of Death: Time of Death: September 02, 2131  Length of Stay: Sep 15, 2024  Referring Physician: Vicci Mliss SAUNDERS, FNP   Reason(s) for Hospitalization    Diagnoses  Preliminary cause of death:  Secondary Diagnoses (including complications and co-morbidities):  Principal Problem:   Brain bleed [facility]) Active Problems:   Protein-calorie malnutrition, severe   Brief Hospital Course (including significant findings, care, treatment, and services provided and events leading to death)  Lawrence Barnett is a 74 y.o. year old male who ***    Pertinent Labs and Studies  Significant Diagnostic Studies DG Abd 1 View Result Date: 07/31/2024 EXAM: 1 VIEW XRAY OF THE ABDOMEN 07/31/2024 10:32:59 PM COMPARISON: X-rays earlier today . CLINICAL HISTORY: Emesis. FINDINGS: LINES, TUBES AND DEVICES: Feeding tube tip in the distal stomach. BOWEL: Nonobstructive bowel gas pattern. SOFT TISSUES: No opaque urinary calculi. BONES: No acute osseous abnormality. IMPRESSION: 1. Feeding tube tip terminates in the distal stomach. 2. No acute findings. Electronically signed by: Norman Gatlin MD 07/31/2024 10:37 PM EST RP Workstation: [id number]   US  Abdomen Limited RUQ (LIVER/GB) Result Date: 07/31/2024 CLINICAL DATA:  [id number] Vomiting [id number] , elevated serum bilirubin EXAM: ULTRASOUND ABDOMEN LIMITED RIGHT UPPER QUADRANT COMPARISON:  07/31/2024, July 20, 2024 FINDINGS: Gallbladder: No gallstones. Diffuse circumferential gallbladder wall thickening with wall edema, overall measuring 7 mm thick. No pericholecystic fluid. Unable to evaluate for a sonographic Murphy's sign as the patient was intubated and sedated during the exam. 3 x 4 mm echogenicity along the nondependent gallbladder wall, possibly adherent biliary sludge or a small gallbladder  polyp. Common bile duct: Diameter: 4 mm Liver: Normal echogenicity. No focal lesion identified. No intrahepatic biliary ductal dilation. Portal vein is patent on color Doppler imaging with normal direction of blood flow towards the liver. Other: None. IMPRESSION: Circumferential gallbladder wall thickening with wall edema, which in the absence of stones and pericholecystic fluid, may be due to acute hepatitis, chronic liver disease, upper abdominal inflammation, or volume overload from either CHF or renal failure. Laboratory correlation recommended. Electronically Signed   By: Rogelia Myers M.D.   On: 07/31/2024 14:59   DG CHEST PORT 1 VIEW Result Date: 07/31/2024 CLINICAL DATA:  [id number] Encounter for central line placement [id number] EXAM: PORTABLE CHEST - 1 VIEW COMPARISON:  07/31/2024 5 a.m. FINDINGS: Endotracheal tube in place terminating in the lower trachea. Esophageal temperature probe noted in the mid upper esophagus. Weighted feeding tube courses below the diaphragm, extending outside the field of view. Interval placement of a right IJ central venous catheter, which terminates in the lower SVC. Biapical pleural thickening. No focal airspace consolidation, pleural effusion, or pneumothorax. No cardiomegaly. Tortuous aorta with aortic atherosclerosis. No acute fracture or destructive lesions. Multilevel thoracic osteophytosis. IMPRESSION: Interval placement of a right IJ approach central venous catheter, which terminates in the lower SVC. No pneumothorax. Electronically Signed   By: Rogelia Myers M.D.   On: 07/31/2024 14:51   DG Abd Portable 1V Result Date: 07/31/2024 EXAM: 1 VIEW XRAY OF THE ABDOMEN 07/31/2024 09:24:00 AM COMPARISON: 07/30/2024 CLINICAL HISTORY: Vomiting FINDINGS: LINES, TUBES AND DEVICES: Feeding tube in place with tip overlying gastric antrum. BOWEL: Gas within nondilated loops of bowel within the mid abdomen. Nonobstructive bowel gas pattern. SOFT TISSUES: No opaque urinary  calculi. BONES: No acute osseous abnormality. IMPRESSION: 1. Feeding tube  tip overlies the gastric antrum, appropriate positioning. Electronically signed by: Donnice Mania MD 07/31/2024 01:30 PM EST RP Workstation: [id number]   EEG adult Result Date: 07/31/2024 Gregg Lek, MD     07/31/2024  9:08 AM Patient Name: Lawrence Barnett MRN: [id number] Epilepsy Attending: Lek Gregg Referring Physician/Provider: No ref. provider found     Date: 07/31/2024 Duration: 22 minutes Patient history: Critically sick patient with worsening mental status Level of alertness: Intubated AEDs during EEG study: None Technical aspects: This EEG study was done with scalp electrodes positioned according to the 10-20 International system of electrode placement. Electrical activity was reviewed with band pass filter of 1-70Hz , sensitivity of 7 uV/mm, display speed of 80mm/sec with a 60Hz  notched filter applied as appropriate. EEG data were recorded continuously and digitally stored.  Video monitoring was available and reviewed as appropriate. Description: EEG showed continuous generalized polymorphic sharply contoured 3 to 6 Hz theta-delta slowing. EEG showed generalized periodic discharges with triphasic morphology at  Hz, more prominent when awake/stimulated. Hyperventilation and photic stimulation were not performed.   ABNORMALITY - Continuous slow, generalized IMPRESSION: This study is suggestive of moderate diffuse brain dysfunction such as encephalopathy, nonspecific etiology but likely related to sedation, toxic-metabolic etiology. No seizures or epileptiform discharges were seen throughout the recording. Lek Gregg MD Neurology    DG CHEST PORT 1 VIEW Result Date: 07/31/2024 EXAM: 1 VIEW(S) XRAY OF THE CHEST 07/31/2024 05:20:05 AM COMPARISON: Portable chest 07/30/2024 04:41 AM. CLINICAL HISTORY: Endotracheal tube present. FINDINGS: LINES, TUBES AND DEVICES: Endotracheal tube tip is 4.4 cm from the carinal angle. LUNGS  AND PLEURA: The lungs are clear of infiltrates. Right upper lobe apical scarring is again shown. No pleural effusion. No pneumothorax. HEART AND MEDIASTINUM: The mediastinum is stable. The cardiac size is normal. BONES AND SOFT TISSUES: Osteopenia. IMPRESSION: 1. Endotracheal tube tip approximately 4.4 cm above the carina, within acceptable position. 2. No acute cardiopulmonary process. 3. Right upper lobe apical scarring, unchanged. Electronically signed by: Francis Quam MD 07/31/2024 06:39 AM EST RP Workstation: [id number]   DG Abd 1 View Result Date: 07/30/2024 EXAM: 1 VIEW XRAY OF THE ABDOMEN 07/30/2024 09:28:00 PM COMPARISON: 07/18/2024 CLINICAL HISTORY: Belly pain. FINDINGS: BOWEL: Nonobstructive bowel gas pattern. SOFT TISSUES: No opaque urinary calculi. BONES: No acute osseous abnormality. IMPRESSION: 1. No acute findings. Electronically signed by: Franky Crease MD 07/30/2024 09:33 PM EST RP Workstation: [id number]   CT HEAD WO CONTRAST ( ) Result Date: 07/30/2024 EXAM: CT HEAD WITHOUT CONTRAST 07/30/2024 02:06:47 PM TECHNIQUE: CT of the head was performed without the administration of intravenous contrast. Automated exposure control, iterative reconstruction, and/or weight based adjustment of the mA/kV was utilized to reduce the radiation dose to as low as reasonably achievable. COMPARISON: None available. CLINICAL HISTORY: Stroke, follow up FINDINGS: BRAIN AND VENTRICLES: Continued evolution of intraparenchymal hemorrhage in the right thalamus with slightly decreased hyperdense blood products, slightly increased edema and similar mass effect. Slight increase in small volume of intraventricular hemorrhage. Known small infarct better seen on prior MRI. No evidence of new/interval large vascular territory infarct. Similar patchy white matter hypodensities, compatible with chronic microvascular ischemic change. No hydrocephalus. No extra-axial collection. No mass effect or midline shift. ORBITS: No  acute abnormality. SINUSES: Opacified left maxillary sinus. SOFT TISSUES AND SKULL: No acute soft tissue abnormality. No skull fracture. IMPRESSION: 1. Continued evolution of intraparenchymal hemorrhage in the right thalamus with slightly decreased hyperdense blood products, slightly increased edema and similar mass effect. 2. Slight increase in small volume of intraventricular  hemorrhage. No hydrocephalus. Electronically signed by: Gilmore Molt MD 07/30/2024 02:16 PM EST RP Workstation: [id number]   VAS US  LOWER EXTREMITY VENOUS (DVT) Result Date: 07/29/2024  Lower Venous DVT Study Patient Name:  Lawrence Barnett  Date of Exam:   07/29/2024 Medical Rec #: [id number]        Accession #:    [id number] Date of Birth: 1950-09-11        Patient Gender: M Patient Age:   35 years Exam Location[facility]  [facility] Procedure:      VAS US  LOWER EXTREMITY VENOUS (DVT) Referring Phys: DORETHIA CAVE --------------------------------------------------------------------------------  Indications: Edema, and H/O lower extremity DVT.  Comparison Study: No prior exam. Performing Technologist: Edilia Elden Appl  Examination Guidelines: A complete evaluation includes B-mode imaging, spectral Doppler, color Doppler, and power Doppler as needed of all accessible portions of each vessel. Bilateral testing is considered an integral part of a complete examination. Limited examinations for reoccurring indications may be performed as noted. The reflux portion of the exam is performed with the patient in reverse Trendelenburg.  +---------+---------------+---------+-----------+----------+--------------+ RIGHT    CompressibilityPhasicitySpontaneityPropertiesThrombus Aging +---------+---------------+---------+-----------+----------+--------------+ CFV      Full           Yes      Yes                                 +---------+---------------+---------+-----------+----------+--------------+ SFJ      Full           Yes       Yes                                 +---------+---------------+---------+-----------+----------+--------------+ FV Prox  Full                                                        +---------+---------------+---------+-----------+----------+--------------+ FV Mid   Partial        Yes      Yes                                 +---------+---------------+---------+-----------+----------+--------------+ FV DistalPartial        Yes      Yes                                 +---------+---------------+---------+-----------+----------+--------------+ PFV      Full                                                        +---------+---------------+---------+-----------+----------+--------------+ POP      Partial        Yes      Yes                                 +---------+---------------+---------+-----------+----------+--------------+ PTV      Full                                                        +---------+---------------+---------+-----------+----------+--------------+  PERO     Full                                                        +---------+---------------+---------+-----------+----------+--------------+ Non-occlusive deep vein thrombosis noted in the middle and distal femoral vein and in the popliteal vein.  +---------+---------------+---------+-----------+----------+--------------+ LEFT     CompressibilityPhasicitySpontaneityPropertiesThrombus Aging +---------+---------------+---------+-----------+----------+--------------+ CFV      Full           Yes      Yes                                 +---------+---------------+---------+-----------+----------+--------------+ SFJ      Full           Yes      Yes                                 +---------+---------------+---------+-----------+----------+--------------+ FV Prox  Full                                                         +---------+---------------+---------+-----------+----------+--------------+ FV Mid   Full                                                        +---------+---------------+---------+-----------+----------+--------------+ FV DistalFull                                                        +---------+---------------+---------+-----------+----------+--------------+ PFV      None           No       No                                  +---------+---------------+---------+-----------+----------+--------------+ POP      Full           Yes      Yes                                 +---------+---------------+---------+-----------+----------+--------------+ PTV      Full                                                        +---------+---------------+---------+-----------+----------+--------------+ PERO     Full                                                        +---------+---------------+---------+-----------+----------+--------------+  Deep vein thrombosis noted in the profunda vein.    Summary: RIGHT: - Findings consistent with age indeterminate deep vein thrombosis involving the right femoral vein, and right popliteal vein.  - No cystic structure found in the popliteal fossa.  LEFT: - Findings consistent with age indeterminate deep vein thrombosis involving the left proximal profunda vein.  - No cystic structure found in the popliteal fossa.  *See table(s) above for measurements and observations. Electronically signed by Lonni Gaskins MD on 07/29/2024 at 1:36:19 PM.    Final    DG CHEST PORT 1 VIEW Result Date: 07/29/2024 EXAM: 1 VIEW(S) XRAY OF THE CHEST 07/29/2024 05:22:00 AM COMPARISON: Portable chest 07/19/2024. CLINICAL HISTORY: Endotracheal tube present. FINDINGS: LINES, TUBES AND DEVICES: Endotracheal tube is in place with its tip 4.9 cm from the carina. The feeding tube passes well into the stomach; however, its radiopaque tip is out of view. LUNGS AND PLEURA:  Chronic asymmetric right apical pleuroparenchymal scarring. The lungs are clear of infiltrates. No significant pleural effusion. No pneumothorax. HEART AND MEDIASTINUM: Aortic atherosclerosis and tortuosity with stable mediastinum. The cardiac size is normal. BONES AND SOFT TISSUES: No acute osseous abnormality. IMPRESSION: 1. Endotracheal tube in place with tip 4.9 cm from the carina. 2. No acute cardiopulmonary findings. Electronically signed by: Francis Quam MD 07/29/2024 06:28 AM EST RP Workstation: [id number]   CT HEAD WO CONTRAST ( ) Result Date: 07/28/2024 EXAM: CT HEAD WITHOUT CONTRAST 07/28/2024 04:35:00 PM TECHNIQUE: CT of the head was performed without the administration of intravenous contrast. Automated exposure control, iterative reconstruction, and/or weight based adjustment of the mA/kV was utilized to reduce the radiation dose to as low as reasonably achievable. COMPARISON: Head CT 07/19/2024 and MRI 07/22/2024. CLINICAL HISTORY: Stroke, hemorrhagic. FINDINGS: BRAIN AND VENTRICLES: An evolving hemorrhage centered in the right thalamus demonstrates mildly decreasing density compared to the prior CT. The residual hyperdense hemorrhage measures 3.1 x 2.1 x 1.6 cm. There is persistent mild surrounding edema and mild mass effect. Small volume intraventricular hemorrhage on the prior studies has decreased, with trace residual hemorrhage layering in the lateral ventricles. The ventricles are unchanged in size. A 1 cm hypodensity in the posterior right corona radiata corresponds to an acute infarct on the prior MRI, however the vast majority of the acute cerebral and cerebellar infarcts on MRI are occult by CT. Patchy to confluent hypodensities elsewhere in the cerebral white matter bilaterally are nonspecific but compatible with extensive chronic small vessel ischemic disease. There is mild cerebral atrophy. No acute large territory infarct, new intracranial hemorrhage, significant midline shift, or  extra-axial fluid collection is identified. ORBITS: No acute abnormality. SINUSES: Mild mucosal thickening in the bilateral ethmoid and right maxillary sinuses. Persistent near complete opacification of the left maxillary sinus. Clear mastoid air cells. SOFT TISSUES AND SKULL: No acute soft tissue abnormality. No skull fracture. IMPRESSION: 1. Continued evolution of a right thalamic hemorrhage with persistent mild edema. Decreased intraventricular hemorrhage. 2. Extensive chronic small vessel ischemic disease. The numerous small acute infarcts on the recent MRI are largely occult by CT. Electronically signed by: Dasie Hamburg MD 07/28/2024 04:48 PM EST RP Workstation: [id number]   VAS US  UPPER EXTREMITY VENOUS DUPLEX Result Date: 07/28/2024 UPPER VENOUS STUDY  Patient Name:  Lawrence Barnett  Date of Exam:   07/28/2024 Medical Rec #: [id number]        Accession #:    [id number] Date of Birth: 10-27-1950        Patient Gender: M Patient Age:  73 years Exam Location:  [facility] Procedure:      VAS US  UPPER EXTREMITY VENOUS DUPLEX Referring Phys: MURALI RAMASWAMY --------------------------------------------------------------------------------  Indications: Edema, and Rule out DVT. Comparison Study: No prior exam. Performing Technologist: Edilia Elden Appl  Examination Guidelines: A complete evaluation includes B-mode imaging, spectral Doppler, color Doppler, and power Doppler as needed of all accessible portions of each vessel. Bilateral testing is considered an integral part of a complete examination. Limited examinations for reoccurring indications may be performed as noted.  Right Findings: +----------+------------+---------+-----------+----------+--------------+ RIGHT     CompressiblePhasicitySpontaneousProperties   Summary     +----------+------------+---------+-----------+----------+--------------+ IJV           Full       Yes       Yes                              +----------+------------+---------+-----------+----------+--------------+ Subclavian    Full       Yes       Yes                             +----------+------------+---------+-----------+----------+--------------+ Axillary      None       No        No                              +----------+------------+---------+-----------+----------+--------------+ Brachial      None       No        No                              +----------+------------+---------+-----------+----------+--------------+ Radial                                              Not visualized +----------+------------+---------+-----------+----------+--------------+ Ulnar                                               Not visualized +----------+------------+---------+-----------+----------+--------------+ Cephalic      None       No        No                              +----------+------------+---------+-----------+----------+--------------+ Basilic       None       No        No                              +----------+------------+---------+-----------+----------+--------------+ Deep vein thrombosis noted in the right axillary and brachial veins. Superficial vein thrombosis noted in the cephalic and basilic veins. The radial and ulnar veins were not visualized due to wrap around bandages in the forearm.  Left Findings: +----------+------------+---------+-----------+----------+-------+ LEFT      CompressiblePhasicitySpontaneousPropertiesSummary +----------+------------+---------+-----------+----------+-------+ IJV           Full       Yes       Yes                      +----------+------------+---------+-----------+----------+-------+  Subclavian    None       No        No                       +----------+------------+---------+-----------+----------+-------+ Axillary      None       No        No                        +----------+------------+---------+-----------+----------+-------+ Brachial      None       No        No                       +----------+------------+---------+-----------+----------+-------+ Radial        None       No        No                       +----------+------------+---------+-----------+----------+-------+ Ulnar         None       No        No                       +----------+------------+---------+-----------+----------+-------+ Cephalic      None       No        No                       +----------+------------+---------+-----------+----------+-------+ Basilic       None       No        No                       +----------+------------+---------+-----------+----------+-------+ Deep vein thrombosis noted in subclavian, axillary, brachial, radial and ulnar veins. Superficial veins noted in the cephalic and basilic veins.  Summary:  Right: Findings consistent with acute deep vein thrombosis involving the right axillary vein and right brachial veins. Findings consistent with acute superficial vein thrombosis involving the right basilic vein and right cephalic vein.  Left: Findings consistent with acute deep vein thrombosis involving the left subclavian vein, left axillary vein, left brachial veins, left radial veins and left ulnar veins. Findings consistent with acute superficial vein thrombosis involving the left basilic vein and left cephalic vein.  *See table(s) above for measurements and observations.  Diagnosing physician: Penne Colorado MD Electronically signed by Penne Colorado MD on 07/28/2024 at 3:58:47 PM.    Final    EEG adult Result Date: 07/28/2024 Gregg Lek, MD     07/28/2024 12:21 PM Patient Name: Lawrence Barnett MRN: [id number] Epilepsy Attending: Lek Gregg Referring Physician/Provider: No ref. provider found     Date: 07/28/2024 Duration: 24 minutes Patient history: Worsening mental status, EEG to evaluate for seizure Level of alertness:  Intubated and sedated AEDs during EEG study: None Technical aspects: This EEG study was done with scalp electrodes positioned according to the 10-20 International system of electrode placement. Electrical activity was reviewed with band pass filter of 1-70Hz , sensitivity of 7 uV/mm, display speed of 36mm/sec with a 60Hz  notched filter applied as appropriate. EEG data were recorded continuously and digitally stored.  Video monitoring was available and reviewed as appropriate. Description: EEG showed continuous generalized theta slowing. Hyperventilation and photic stimulation were not performed.   ABNORMALITY - Continuous slow, generalized IMPRESSION:  This study is suggestive of mild diffuse brain dysfunction such as encephalopathy, nonspecific etiology but likely related to sedation, toxic-metabolic etiology. No seizures or epileptiform discharges were seen throughout the recording. Pastor Falling MD Neurology    EEG adult Result Date: 07/23/2024 Falling Pastor, MD     07/23/2024  7:36 PM Patient Name: Lawrence Barnett MRN: [id number] Epilepsy Attending: Pastor Falling Referring Physician/Provider: No ref. 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 Remains encephalopathic. EEG to rule out seizure Level of alertness: Intubated and sedated AEDs during EEG study: None Technical aspects: This EEG study was done with scalp electrodes positioned according to the 10-20 International system of electrode placement. Electrical activity was reviewed with band pass filter of 1-70Hz , sensitivity of 7 uV/mm, display speed of 35mm/sec with a 60Hz  notched filter applied as appropriate. EEG data were recorded continuously and digitally stored.  Video monitoring was available and reviewed as appropriate. Description: EEG showed continuous generalized polymorphic sharply contoured 3 to 6 Hz theta-delta slowing. Hyperventilation and photic stimulation were not  performed.   ABNORMALITY - Continuous slow, generalized IMPRESSION: This study is suggestive of moderate to severe diffuse encephalopathy, nonspecific etiology but likely related to sedation, toxic-metabolic etiology, hypoxic brain injury.  No seizures or epileptiform discharges were seen throughout the recording. Pastor Falling MD Neurology    MR BRAIN WO CONTRAST Result Date: 07/22/2024 EXAM: MRI BRAIN WITHOUT CONTRAST 07/22/2024 12:13:00 PM TECHNIQUE: Multiplanar multisequence MRI of the head/brain was performed without the administration of intravenous contrast. COMPARISON: MR Head Without and With IV Contrast 07/16/2024. CT head 07/19/2024. CLINICAL HISTORY: Stroke, follow up. FINDINGS: BRAIN AND VENTRICLES: Numerous new punctate and small foci of restricted diffusion predominantly involving the bilateral centrum semiovale and deep white matter with associated T2/FLAIR hyperintensity. There are additional scattered diffusion-restricting foci involving the bilateral basal ganglia, bilateral frontal, parietal, occipital, and temporal lobes as well as the right cerebellar hemisphere and posterior right insula. 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 There is overall similar effacement of the right lateral and third ventricles. Re-demonstrated foci of susceptibility notable within sthe left parietal lobe, right temporal lobe, left thalamus and pons in keeping with the sequela of chronic hemorrhages. Slightly increased intraventricular hemorrhage layering within the left occipital horn which could reflect redistribution. Otherwise overall similar intraventricular hemorrhage within the right occipital horn and fourth ventricle. There is moderate to severe subcortical and periventricular T2 and FLAIR signal hyperintensity likely reflecting sequelae of acute and chronic ischemia, increased since prior exam. The sella is unremarkable. Normal flow voids.  ORBITS: No acute abnormality. SINUSES AND MASTOIDS: Opacified left maxillary sinus with diffusion restricting contents. There is scattered mucosal thickening of the bilateral frontal, ethmoid, and right maxillary paranasal sinuses. Trace bilateral mastoid effusions. BONES AND SOFT TISSUES: Normal marrow signal. No acute soft tissue abnormality. IMPRESSION: 1. Since 07/16/2024, numerous new punctate and small acute to early subacute infarcts predominantly involving the bilateral centrum semiovale and deep white matter, with additional involvement of the bilateral basal ganglia, cerebral lobes, right cerebellum and right insula consistent with multifocal acute ischemia. 2. 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 3. Left maxillary sinus opacified with diffusion-restricting contents, correlate for acute sinusitis. Electronically signed by: prentice bybordi 07/22/2024 01:41 PM EST RP Workstation: [id number]   US  RENAL Result Date: 07/20/2024 EXAM: US  Retroperitoneum Complete, Renal. CLINICAL HISTORY: [id number] AKI (acute kidney injury) 409830 AKI (acute kidney  injury) TECHNIQUE: Real-time ultrasound of the retroperitoneum (complete) with image documentation. COMPARISON: Comparison is made to 07/17/2019. FINDINGS: RIGHT KIDNEY: Multiple simple cortical cysts are seen within the right kidney measuring up to 2.3 cm for which no follow up imaging is recommended. Renal cortical thickness and echogenicity is within normal limits. No hydronephrosis. No intrarenal mass or calcification. LEFT KIDNEY: Two continuous cortical cysts are identified within the medial interpolar region of the left kidney and are partially visualized due to shadowing by overlying bowel gas, but appear grossly stable since prior examination. No follow up imaging is recommended for these lesions. Renal cortical thickness and echogenicity is within normal limits. No hydronephrosis. No intrarenal  mass or calcification. BLADDER: The bladder is distended, but is otherwise unremarkable without intraluminal debris or mural mass. IMPRESSION: 1. No hydronephrosis 2. bladder distention, possibly related to bladder outlet obstruction or voluntary retention. 3. Multiple simple renal cortical cysts, no follow-up recommended Electronically signed by: Dorethia Molt MD 07/20/2024 03:03 AM EST RP Workstation: [id number]   Portable Chest x-ray Result Date: 07/19/2024 EXAM: 1 VIEW XRAY OF THE CHEST 07/19/2024 10:48:00 PM COMPARISON: Portable chest yesterday at 8:54 pm. CLINICAL HISTORY: [id number] Endotracheal tube present [id number] Endotracheal tube present. FINDINGS: LINES, TUBES AND DEVICES: Tip of endotracheal tube (ETT) 4.3 cm from the carina. Nasogastric tube (NGT) enters the stomach with the full intragastric course out of view. There are multiple overlying telemetry leads. There are overlying electrical pads inferiorly. LUNGS AND PLEURA: No focal pulmonary opacity. The lungs are clear of infiltrate. No pleural effusion. No pneumothorax. HEART AND MEDIASTINUM: The mediastinum is stable with mild aortic tortuosity and atherosclerosis. The cardiac size is normal. BONES AND SOFT TISSUES: Thoracic spondylosis. No acute osseous abnormality. IMPRESSION: 1. Appropriate positioning of endotracheal tube with tip 4.3 cm above the carina, and nasogastric tube coursing into the stomach with intragastric portion out of view. 2. No acute cardiopulmonary process identified. Electronically signed by: Francis Quam MD 07/19/2024 11:04 PM EST RP Workstation: [id number]   CT HEAD WO CONTRAST ( ) Result Date: 07/19/2024 EXAM: CT HEAD WITHOUT CONTRAST 07/19/2024 09:41:57 PM TECHNIQUE: CT of the head was performed without the administration of intravenous contrast. Automated exposure control, iterative reconstruction, and/or weight based adjustment of the mA/kV was utilized to reduce the radiation dose to as low as reasonably  achievable. COMPARISON: Ct Head Jul 18, 2024 CLINICAL HISTORY: Neuro deficit, acute, stroke suspected; follow-up CT recommended by neurology FINDINGS: BRAIN AND VENTRICLES: No significant change in size of an intraparenchymal hemorrhage in the right thalamus. Slight interval increase in size of intraventricular hemorrhage. New trace subarachnoid hemorrhage layering in the right sylvian fissure. No progressive mass effect. Stable mild hydrocephalus. Stable mild hydrocephalus. Similar patchy white matter hypodensities, compatible with chronig microvascular ischemic change. ORBITS: No acute abnormality. SINUSES: Opacfied left maxillary sinus and right maxillary sinus mucosal thickening. SOFT TISSUES AND SKULL: No acute soft tissue abnormality. No skull fracture. IMPRESSION: 1. No significant change in size of an intraparenchymal hemorrhage in the right thalamus. 2.  Slight interval increase in size of intraventricular hemorrhage. 3.  New trace subarachnoid hemorrhage layering in the right sylvian fissure. 4. Stable mild hydrocephalus. Electronically signed by: Gilmore Molt MD 07/19/2024 10:12 PM EST RP Workstation: [id number]   CT HEAD WO CONTRAST ( ) Result Date: 07/18/2024 EXAM: CT HEAD WITHOUT CONTRAST 07/18/2024 11:04:43 PM TECHNIQUE: CT of the head was performed without the administration of intravenous contrast. Automated exposure control, iterative reconstruction, and/or weight based adjustment of the mA/kV was utilized to reduce  the radiation dose to as low as reasonably achievable. COMPARISON: CT head Jul 15, 2024 CLINICAL HISTORY: Mental status change, unknown cause FINDINGS: BRAIN AND VENTRICLES: Slight interval decrease in size of the recent right thalamic hemorrhage, which now measures approximately 3.3 x 2.5 x 2.2 cm. Intraventricular hemorrhage is also decreased. Mild interval increase in ventricular size with more rounded third ventricle and temporal horns, concerning for mildly progressive  hydrocephalus. No evidence of acute infarct. No extra-axial collection. No mass effect or midline shift. Patchy white matter hypodensiteis are compatible with chronic microvascular ischemic change. ORBITS: No acute abnormality. 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 Mastoid air cells are clear. SOFT TISSUES AND SKULL: No skull fracture. Findings will be conveyed to the clinical team by the radiology assistant and documented in PACs/Clario. IMPRESSION: 1. 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 2. Mild interval decrease in right thalamic intraparenchymal hemorrhage and intraventricular hemorrhage. Electronically signed by: Gilmore Molt MD 07/18/2024 11:30 PM EST RP Workstation: [id number]   DG CHEST PORT 1 VIEW Result Date: 07/18/2024 EXAM: 1 VIEW(S) XRAY OF THE CHEST 07/18/2024 08:57:00 PM COMPARISON: None available. CLINICAL HISTORY: Sepsis (HCC) [id number]; Respiratory distress [id number]. FINDINGS: LINES, TUBES AND DEVICES: Intertrochanterics below the hemidiaphragm with the tip inside the cord collimated off view. LUNGS AND PLEURA: Left base atelectasis. No pleural effusion. No pneumothorax. HEART AND MEDIASTINUM: No acute abnormality of the cardiac and mediastinal silhouettes. BONES AND SOFT TISSUES: No acute osseous abnormality. IMPRESSION: 1. No acute findings. Electronically signed by: Morgane Naveau MD 07/18/2024 09:06 PM EST RP Workstation: [id number]   DG Abd Portable 1V Result Date: 07/18/2024 EXAM: 1 VIEW XRAY OF THE ABDOMEN 07/18/2024 02:37:00 PM COMPARISON: None available. CLINICAL HISTORY: Nasogastric tube present. FINDINGS: LINES, TUBES AND DEVICES: Nasogastric tube has been advanced, tip in the region of the gastric antrum. BOWEL: Nonobstructive bowel gas pattern. Residual contrast in the visualized colon. SOFT  TISSUES: No opaque urinary calculi. BONES: No acute osseous abnormality. IMPRESSION: 1. Nasogastric tube tip in the region of the gastric antrum. 2. Residual contrast within the colon. Electronically signed by: Katheleen Faes MD 07/18/2024 02:43 PM EST RP Workstation: [id number]   DG Abd Portable 1V Result Date: 07/18/2024 EXAM: 1 VIEW XRAY OF THE ABDOMEN 07/18/2024 11:53:00 AM COMPARISON: None available. CLINICAL HISTORY: Nasogastric tube present. FINDINGS: LINES, TUBES AND DEVICES: Enteric tube in place with tip overlying the expected region of the gastric lumen and side port overlying the expected region of the gastroesophageal junction. BOWEL: Nonobstructive bowel gas pattern. Enteric contrast noted in the colon with colonic diverticula. SOFT TISSUES: No opaque urinary calculi. BONES: No acute osseous abnormality. IMPRESSION: 1. No acute findings. 2. Enteric tube in appropriate position with side port at the gastroesophageal junction; consider advancing the tube to position the side port within the gastric lumen. Electronically signed by: Dayne Hassell MD 07/18/2024 01:16 PM EST RP Workstation: [id number]   DG CHEST PORT 1 VIEW Result Date: 07/17/2024 EXAM: 1 VIEW(S) XRAY OF THE CHEST 07/17/2024 10:00:00 AM COMPARISON: 07/16/2019 CLINICAL HISTORY: Fever FINDINGS: LUNGS AND PLEURA: No focal pulmonary opacity. No pleural effusion. No pneumothorax. HEART AND MEDIASTINUM: No acute abnormality of the cardiac and mediastinal silhouettes. BONES AND SOFT TISSUES: No acute osseous abnormality. IMPRESSION: 1. No acute cardiopulmonary process. Electronically signed by: Ryan Chess MD 07/17/2024 02:38 PM EST RP Workstation: [id number]   MR BRAIN W WO CONTRAST  Result Date: 07/16/2024 EXAM: MRI BRAIN WITH AND WITHOUT CONTRAST 07/16/2024 05:28:00 AM TECHNIQUE: Multiplanar multisequence MRI of the head/brain was performed with and without the administration of 6 mL gadobutrol  (GADAVIST ) 1 MMOL/ML intravenous  contrast. COMPARISON: CT of the head dated 07/15/2024. CLINICAL HISTORY: Stroke, hemorrhagic. FINDINGS: BRAIN AND VENTRICLES: No acute infarct. There is acute interparenchymal hemorrhage again demonstrated within the right thalamus measuring approximately 2.8 x 2.7 x 3.3 cm, similar to the prior study allowing for differences in technique. There is no evidence of underlying mass. There is interventricular extension again demonstrated with blood layering dependently within the posterior horns of the lateral ventricles bilaterally. There is a focal area of hemosiderin staining also present in the left parietal lobe, in the region of focal encephalomalacia demonstrated on the previous CT. No mass effect or midline shift. No hydrocephalus. The sella is unremarkable. Normal flow voids. No abnormal parenchymal or meningeal enhancement. ORBITS: No acute abnormality. SINUSES: There is chronic left maxillary sinus disease with and/or bowing of the medial wall of the left maxillary sinus resulting in occlusion of the left nasal cavity and opacification of the ethmoid air cells and frontal sinuses. BONES AND SOFT TISSUES: Normal bone marrow signal and enhancement. No acute soft tissue abnormality. IMPRESSION: 1. Acute intraparenchymal hemorrhage in the right thalamus, measuring approximately 2.8 x 2.7 x 3.3 cm, with interventricular extension and blood layering dependently within the posterior horns of the lateral ventricles bilaterally, similar to the prior study. 2. No evidence of underlying mass or abnormal parenchymal or meningeal enhancement. 3. Focal area of hemosiderin staining in the left parietal lobe, in the region of focal encephalomalacia demonstrated on the previous CT. 4. 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 Electronically signed by: Evalene Coho MD 07/16/2024 05:50 AM EST RP Workstation: [id number]   ECHOCARDIOGRAM COMPLETE Result Date: 07/15/2024    ECHOCARDIOGRAM REPORT   Patient Name:   Lawrence Barnett Date of Exam: 07/15/2024 Medical Rec #:  [id number]       Height:       72.0 in Accession #:    [id number]      Weight:       140.0 lb Date of Birth:  [birth year]       BSA:          1.831 m Patient Age:    73 years        BP:           143/94 mmHg Patient Gender: M               HR:           104 bpm. Exam Location:  Inpatient Procedure: 2D Echo, Cardiac Doppler and Color Doppler (Both Spectral and Color            Flow Doppler were utilized during procedure). Indications:    Stroke  History:        Patient has no prior history of Echocardiogram examinations.                 Stroke; Risk Factors:Hypertension.  Sonographer:    Juliene Rucks Referring Phys: [id number] [facility]  Sonographer Comments: Image acquisition challenging due to patient body habitus. IMPRESSIONS  1. Difficult images, EF appears roughly preserved. Left ventricular ejection fraction, by estimation, is 55 to 60%. The left ventricle has  normal function. Left ventricular endocardial border not optimally defined to evaluate regional wall motion. Left ventricular diastolic function could not be evaluated.  2. Right ventricular systolic function was not well visualized. The right ventricular size is not well visualized.  3. The mitral valve is grossly normal. No evidence of mitral valve regurgitation.  4. The aortic valve is grossly normal. Aortic valve regurgitation is not visualized. FINDINGS  Left Ventricle: Difficult images, EF appears roughly preserved. Left ventricular ejection fraction, by estimation, is 55 to 60%. The left ventricle has normal function. Left ventricular endocardial border not optimally defined to evaluate regional wall motion. The left ventricular internal cavity size was normal in size. There is no left ventricular hypertrophy. Left ventricular  diastolic function could not be evaluated. Right Ventricle: The right ventricular size is not well visualized. Right vetricular wall thickness was not well visualized. Right ventricular systolic function was not well visualized. Left Atrium: Left atrial size was normal in size. Right Atrium: Right atrial size was normal in size. Pericardium: There is no evidence of pericardial effusion. Mitral Valve: The mitral valve is grossly normal. No evidence of mitral valve regurgitation. Tricuspid Valve: The tricuspid valve is grossly normal. Tricuspid valve regurgitation is not demonstrated. Aortic Valve: The aortic valve is grossly normal. Aortic valve regurgitation is not visualized. Pulmonic Valve: The pulmonic valve was not well visualized. Aorta: The aortic root is normal in size and structure. IAS/Shunts: The interatrial septum was not well visualized.  LEFT VENTRICLE PLAX 2D LVIDd:         4.50 cm   Diastology LVIDs:         3.00 cm   LV e' medial:    2.94 cm/s LV PW:         1.00 cm   LV E/e' medial:  25.8 LV IVS:        1.00 cm   LV e' lateral:   7.29 cm/s LVOT diam:     2.20 cm   LV E/e' lateral: 10.4 LV SV:         40 LV SV Index:   22 LVOT Area:     3.80 cm  RIGHT VENTRICLE          IVC RV Basal diam:  3.30 cm  IVC diam: 1.00 cm RV Mid diam:    2.70 cm LEFT ATRIUM             Index        RIGHT ATRIUM           Index LA diam:        2.60 cm 1.42 cm/m   RA Area:     16.90 cm LA Vol (A2C):   38.1 ml 20.81 ml/m  RA Volume:   46.30 ml  25.29 ml/m LA Vol (A4C):   29.0 ml 15.84 ml/m LA Biplane Vol: 35.3 ml 19.28 ml/m  AORTIC VALVE LVOT Vmax:   80.60 cm/s LVOT Vmean:  50.600 cm/s LVOT VTI:    0.105 m  AORTA Ao Root diam: 3.50 cm MITRAL VALVE               TRICUSPID VALVE MV Area (PHT): 6.32 cm    TR Peak grad:   14.6 mmHg MV Decel Time: 120 msec    TR Vmax:        191.00 cm/s MV E velocity: 75.80 cm/s MV A velocity: 42.80 cm/s  SHUNTS MV E/A ratio:  1.77        Systemic  VTI:  0.10 m                             Systemic Diam: 2.20 cm Morene Brownie Electronically signed by Morene Brownie Signature Date/Time: 07/15/2024/4:44:42 PM    Final    DG Swallowing Func-Speech Pathology Result Date: 07/15/2024 Table formatting from the original result was not included. Modified Barium Swallow Study Patient Details Name: Lawrence Barnett MRN: [id number] Date of Birth: 06/15/1951 Today's Date: 07/15/2024 HPI/PMH: HPI: 74 yo male with history of HTN who presents to ED 11/11 with L sided weakness. 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 MRI pending. Clinical Impression: Pt exhibits a primary oral dysphagia with mild pharyngeal deficits. He holds boluses within his oral cavity for prolonged periods of time and initiates a swallow only when it reaches his pyriform sinuses. Despite complete laryngeal elevation, this mistiming allows penetration during the swallow with thin liquids that eventually progresses below the vocal folds (PAS 7). Although oral holding occurs with all consistencies, the bolus is better controlled as it enters the pharynx with nectar thick liquids, preventing frank penetration and aspiration. Trace superficial penetrates are expelled with a volitional throat clear (PAS 3). Oral transit became increasingly disorganized with solids, which may also be affected by missing dentition. Purees and solids resulted in increased residue along the BOT, valleculae, and posterior pharyngeal wall, with minimally improved clearance using a nectar thick liquid wash. He was unable to swallow the 13 mm barium tablet with nectar thick liquids, necessitating expectoration. Question his ability to meet nutritional needs given the extent of oral holding observed. Can offer nectar thick liquids from floor stock with full supervision to monitor for oral clearance. Otherwise, recommend he remain NPO with consideration of short-term enteral nutrition. Factors that may increase risk of adverse event in presence of aspiration Noe &  Lianne 2021): Factors that may increase risk of adverse event in presence of aspiration Noe & Lianne 2021): Reduced cognitive function; Limited mobility; Frail or deconditioned Recommendations/Plan: Swallowing Evaluation Recommendations Swallowing Evaluation Recommendations Recommendations: NPO (x nectar thick liquids from floorstock) Medication Administration: Via alternative means Supervision: Full assist for feeding; Full supervision/cueing for swallowing strategies Swallowing strategies  : Check for pocketing or oral holding Postural changes: Position pt fully upright for meals; Stay upright 30-60 min after meals Oral care recommendations: Oral care QID (4x/day); Oral care before ice chips/water ; Staff/trained caregiver to provide oral care Caregiver Recommendations: Avoid jello, ice cream, thin soups, popsicles; Remove water  pitcher; Have oral suction available Treatment Plan Treatment Plan Treatment recommendations: Therapy as outlined in treatment plan below Functional status assessment: Patient has had a recent decline in their functional status and demonstrates the ability to make significant improvements in function in a reasonable and predictable amount of time. Treatment frequency: Min 2x/week Treatment duration: 2 weeks Interventions: Aspiration precaution training; Compensatory techniques; Patient/family education; Trials of upgraded texture/liquids Recommendations Recommendations for follow up therapy are one component of a multi-disciplinary discharge planning process, led by the attending physician.  Recommendations may be updated based on patient status, additional functional criteria and insurance authorization. Assessment: Orofacial Exam: Orofacial Exam Oral Cavity: Oral Hygiene: WFL Oral Cavity - Dentition: Missing dentition Orofacial Anatomy: WFL Oral Motor/Sensory Function: Suspected cranial nerve impairment CN V - Trigeminal: WFL CN VII - Facial: Left motor impairment CN IX -  Glossopharyngeal, CN X - Vagus: WFL CN XII - Hypoglossal: Left motor impairment Anatomy: Anatomy: Suspected cervical osteophytes Boluses Administered:  Boluses Administered Boluses Administered: Thin liquids (Level 0); Mildly thick liquids (Level 2, nectar thick); Moderately thick liquids (Level 3, honey thick); Puree; Solid  Oral Impairment Domain: Oral Impairment Domain Lip Closure: Escape beyond mid-chin Tongue control during bolus hold: Cohesive bolus between tongue to palatal seal Bolus preparation/mastication: Slow prolonged chewing/mashing with complete recollection Bolus transport/lingual motion: Delayed initiation of tongue motion (oral holding) Oral residue: Majority of bolus remaining Location of oral residue : Tongue; Palate; Floor of mouth Initiation of pharyngeal swallow : Pyriform sinuses  Pharyngeal Impairment Domain: Pharyngeal Impairment Domain Soft palate elevation: No bolus between soft palate (SP)/pharyngeal wall (PW) Laryngeal elevation: Complete superior movement of thyroid  cartilage with complete approximation of arytenoids to epiglottic petiole Anterior hyoid excursion: Complete anterior movement Epiglottic movement: Complete inversion Laryngeal vestibule closure: Complete, no air/contrast in laryngeal vestibule Pharyngeal stripping wave : Present - complete Pharyngeal contraction (A/P view only): N/A Pharyngoesophageal segment opening: Complete distension and complete duration, no obstruction of flow Tongue base retraction: No contrast between tongue base and posterior pharyngeal wall (PPW) Pharyngeal residue: Collection of residue within or on pharyngeal structures Location of pharyngeal residue: Tongue base; Valleculae; Pyriform sinuses  Esophageal Impairment Domain: No data recorded Pill: Pill Consistency administered: Mildly thick liquids (Level 2, nectar thick) Mildly thick liquids (Level 2, nectar thick): Impaired (see clinical impressions) Penetration/Aspiration Scale Score:  Penetration/Aspiration Scale Score 1.  Material does not enter airway: Mildly thick liquids (Level 2, nectar thick); Moderately thick liquids (Level 3, honey thick); Puree; Solid 7.  Material enters airway, passes BELOW cords and not ejected out despite cough attempt by patient: Thin liquids (Level 0) Compensatory Strategies: Compensatory Strategies Compensatory strategies: No   General Information: Caregiver present: Yes  Diet Prior to this Study: NPO   Temperature : Normal   Respiratory Status: WFL   Supplemental O2: Nasal cannula   History of Recent Intubation: No  Behavior/Cognition: Alert; Cooperative; Distractible; Requires cueing Self-Feeding Abilities: Needs assist with self-feeding Baseline vocal quality/speech: Normal Volitional Cough: Able to elicit Volitional Swallow: Able to elicit Exam Limitations: Poor positioning Goal Planning: Prognosis for improved oropharyngeal function: Good Barriers to Reach Goals: Cognitive deficits No data recorded Patient/Family Stated Goal: resume POs Consulted and agree with results and recommendations: Patient; Advanced practice provider; Nurse Pain: Pain Assessment Pain Assessment: No/denies pain End of Session: Start Time:SLP Start Time (ACUTE ONLY): 1400 Stop Time: SLP Stop Time (ACUTE ONLY): 1430 Time Calculation:SLP Time Calculation (min) (ACUTE ONLY): 30 min Charges: SLP Evaluations $ SLP Speech Visit: 1 Visit SLP Evaluations $BSS Swallow: 1 Procedure $MBS Swallow: 1 Procedure SLP visit diagnosis: SLP Visit Diagnosis: Dysphagia, oropharyngeal phase (R13.12) Past Medical History: Past Medical History: Diagnosis Date  Hypertension  Past Surgical History: No past surgical history on file. Damien Blumenthal, M.A., CCC-SLP Speech Language Pathology, Acute Rehabilitation Services Secure Chat preferred [phone] 07/15/2024, 4:07 PM  CT HEAD POST STROKE FOLLOWUP/TIMED/STAT READ Result Date: 07/15/2024 CLINICAL DATA:  Follow-up examination for stroke. EXAM: CT HEAD WITHOUT  CONTRAST TECHNIQUE: Contiguous axial images were obtained from the base of the skull through the vertex without intravenous contrast. RADIATION DOSE REDUCTION: This exam was performed according to the departmental dose-optimization program which includes automated exposure control, adjustment of the mA and/or kV according to patient size and/or use of iterative reconstruction technique. COMPARISON:  Comparison made with CTs from 07/14/2024. FINDINGS: Brain: Previously identified intraparenchymal hemorrhage centered at the right thalamus again seen. Hemorrhage is slightly increased in size now measuring 3.6 x 2.7 x 2.2 cm (estimated volume  10.5 mL, previously 7 mL). Mild surrounding edema, slightly increased. Intraventricular extension with blood in both lateral ventricles, also slightly increased. Stable ventricular size and morphology without hydrocephalus. No other acute intracranial hemorrhage. No other acute large vessel territory infarct. No mass lesion or midline shift. No extra-axial fluid collection. Underlying atrophy with chronic microvascular ischemic disease noted. Vascular: No abnormal hyperdense vessel. Skull: Scalp soft tissues and calvarium demonstrate no new finding. Sinuses/Orbits: Globes orbital soft tissues demonstrate no acute finding. 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 Mastoid air cells remain largely clear. Other: None. IMPRESSION: 1. Slight interval increase in size of right thalamic hemorrhage, now measuring 3.6 x 2.7 x 2.2 cm (estimated volume 10.5 mL, previously 7 mL). Mild surrounding edema, slightly increased. 2. Intraventricular extension with blood in both lateral ventricles, also slightly increased. Stable ventricular size and morphology without hydrocephalus. 3. Otherwise no other new acute intracranial abnormality. Electronically Signed   By: Morene Hoard M.D.   On: 07/15/2024 03:07   CT ANGIO HEAD NECK W WO CM (CODE STROKE) Result Date:  07/14/2024 CLINICAL DATA:  Initial evaluation for neuro deficit, stroke. EXAM: CT ANGIOGRAPHY HEAD AND NECK WITH AND WITHOUT CONTRAST TECHNIQUE: Multidetector CT imaging of the head and neck was performed using the standard protocol during bolus administration of intravenous contrast. Multiplanar CT image reconstructions and MIPs were obtained to evaluate the vascular anatomy. Carotid stenosis measurements (when applicable) are obtained utilizing NASCET criteria, using the distal internal carotid diameter as the denominator. RADIATION DOSE REDUCTION: This exam was performed according to the departmental dose-optimization program which includes automated exposure control, adjustment of the mA and/or kV according to patient size and/or use of iterative reconstruction technique. CONTRAST:  65mL OMNIPAQUE  IOHEXOL  350 MG/ML SOLN COMPARISON:  CT from earlier the same day. FINDINGS: CTA NECK FINDINGS Aortic arch: Visualized aortic arch within normal limits for caliber with standard branch pattern. Mild aortic atherosclerosis. No stenosis about the origin the great vessels. Right carotid system: No evidence of dissection, stenosis (50% or greater), or occlusion. Left carotid system: No evidence of dissection, stenosis (50% or greater), or occlusion. Vertebral arteries: Vertebral arteries are patent without significant stenosis or dissection. Left vertebral artery dominant. Skeleton: No worrisome osseous lesions. Moderate to advanced cervical spondylosis. Poor dentition. Other neck: No other acute finding. Bilateral parotid sialolithiasis. 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 1.9 cm left thyroid  nodule. Upper chest: Emphysema. Review of the MIP images confirms the above findings CTA HEAD FINDINGS Anterior circulation: Both internal carotid arteries widely patent to the termini without stenosis. A1 segments widely patent. Normal anterior communicating artery complex. Both anterior cerebral arteries widely  patent to their distal aspects without stenosis. No M1 stenosis or occlusion. Normal MCA bifurcations. Distal MCA branches well perfused and symmetric. Posterior circulation: Both V4 segments patent without stenosis. Both PICA patent. Basilar patent without stenosis. Superior cerebellar and posterior cerebral arteries patent bilaterally. Venous sinuses: Patent allowing for timing the contrast bolus. Anatomic variants: None significant. No aneurysm. No vascular abnormality seen underlying the acute right thalamic hemorrhage. No spot sign. Review of the MIP images confirms the above findings IMPRESSION: 1. Negative CTA of the head and neck. No large vessel occlusion or other emergent finding. No hemodynamically significant or correctable stenosis. 2. No vascular abnormality seen underlying the acute right thalamic hemorrhage. 3. 1.9 cm left thyroid  nodule. Further evaluation with dedicated thyroid  ultrasound recommended. Aortic Atherosclerosis (ICD10-I70.0) and Emphysema (ICD10-J43.9). Electronically Signed   By: Morene Hoard HERO.D.  On: 07/14/2024 19:50   CT HEAD CODE STROKE WO CONTRAST Result Date: 07/14/2024 CLINICAL DATA:  Code stroke. Initial evaluation for acute neuro deficit, stroke suspected. EXAM: CT HEAD WITHOUT CONTRAST TECHNIQUE: Contiguous axial images were obtained from the base of the skull through the vertex without intravenous contrast. RADIATION DOSE REDUCTION: This exam was performed according to the departmental dose-optimization program which includes automated exposure control, adjustment of the mA and/or kV according to patient size and/or use of iterative reconstruction technique. COMPARISON:  Comparison made with prior CT from 07/16/2019 FINDINGS: Brain: Acute intraparenchymal hemorrhage centered at the right thalamus measures 3.2 x 2.3 x 2.0 cm (estimated volume 7 mL). Mild surrounding edema without significant regional mass effect. Intraventricular extension with blood in both  lateral ventricles. No hydrocephalus. No other acute intracranial hemorrhage. No other acute large vessel territory infarct. No mass lesion or extra-axial fluid collection. Underlying atrophy with chronic small vessel ischemic disease noted. Vascular: No abnormal hyperdense vessel. Scattered vascular calcifications noted within the carotid siphons. Skull: Scalp soft tissues within normal limits.  Calvarium intact. Sinuses/Orbits: Right gaze preference noted. Chronic frontoethmoidal and maxillary sinusitis, worse on the left. No mastoid effusion. Other: None. ASPECTS [facility] Stroke Program Early CT Score) Acute ICH, does not apply. IMPRESSION: 1. 3.2 x 2.3 x 2.0 cm (estimated volume 7 mL) acute intraparenchymal hemorrhage centered at the right thalamus. Mild surrounding edema without significant regional mass effect. 2. Intraventricular extension with blood in both lateral ventricles. No hydrocephalus. 3. Underlying atrophy with chronic small vessel ischemic disease. These results were communicated to Dr. Vanessa at 7:17 pm on 07/14/2024 by text page via the [facility] messaging system. Electronically Signed   By: Morene Hoard M.D.   On: 07/14/2024 19:19    Microbiology Recent Results (from the past 240 hours)  Culture, Respiratory w Gram Stain     Status: None   Collection Time: 07/30/24  7:49 AM   Specimen: Tracheal Aspirate; Respiratory  Result Value Ref Range Status   Specimen Description TRACHEAL ASPIRATE  Final   Special Requests NONE  Final   Gram Stain   Final    NO WBC SEEN NO ORGANISMS SEEN Performed at [facility], [address] [place], [place] [address]    Culture FEW STAPHYLOCOCCUS AUREUS  Final   Report Status 08/01/2024 FINAL  Final   Organism ID, Bacteria STAPHYLOCOCCUS AUREUS  Final      Susceptibility   Staphylococcus aureus - MIC*    CIPROFLOXACIN 1 SENSITIVE Sensitive     ERYTHROMYCIN <=0.25 SENSITIVE Sensitive     GENTAMICIN <=0.5 SENSITIVE Sensitive      OXACILLIN 0.5 SENSITIVE Sensitive     TETRACYCLINE <=1 SENSITIVE Sensitive     VANCOMYCIN  1 SENSITIVE Sensitive     TRIMETH/SULFA <=10 SENSITIVE Sensitive     CLINDAMYCIN <=0.25 SENSITIVE Sensitive     RIFAMPIN <=0.5 SENSITIVE Sensitive     Inducible Clindamycin NEGATIVE Sensitive     LINEZOLID  2 SENSITIVE Sensitive     * FEW STAPHYLOCOCCUS AUREUS  Culture, blood (Routine X 2) w Reflex to ID Panel     Status: None   Collection Time: 07/30/24  8:44 AM   Specimen: BLOOD LEFT ARM  Result Value Ref Range Status   Specimen Description BLOOD LEFT ARM  Final   Special Requests   Final    BOTTLES DRAWN AEROBIC AND ANAEROBIC Blood Culture adequate volume   Culture   Final    NO GROWTH 5 DAYS Performed at [facility], [address] [place], [place] [address]    Report Status 08/04/2024 FINAL  Final  Culture, blood (Routine X 2) w Reflex to ID Panel     Status: Abnormal   Collection Time: 07/30/24  8:46 AM   Specimen: BLOOD RIGHT HAND  Result Value Ref Range Status   Specimen Description BLOOD RIGHT HAND  Final   Special Requests   Final    BOTTLES DRAWN AEROBIC AND ANAEROBIC Blood Culture results may not be optimal due to an inadequate volume of blood received in culture bottles   Culture  Setup Time   Final    GRAM POSITIVE COCCI IN BOTH AEROBIC AND ANAEROBIC BOTTLES CRITICAL RESULT CALLED TO, READ BACK BY AND VERIFIED WITH: PHARMD Kara Brazier on [phone] @1620  by SM    Culture (A)  Final    STAPHYLOCOCCUS EPIDERMIDIS THE SIGNIFICANCE OF ISOLATING THIS ORGANISM FROM A SINGLE SET OF BLOOD CULTURES WHEN MULTIPLE SETS ARE DRAWN IS UNCERTAIN. PLEASE NOTIFY THE MICROBIOLOGY DEPARTMENT WITHIN ONE WEEK IF SPECIATION AND SENSITIVITIES ARE REQUIRED. Performed at [facility], [address], [place], [place] [address]    Report Status 08/16/2024 FINAL  Final  Blood Culture ID Panel (Reflexed)     Status: Abnormal   Collection Time: 07/30/24  8:46 AM  Result Value Ref Range  Status   Enterococcus faecalis NOT DETECTED NOT DETECTED Final   Enterococcus Faecium NOT DETECTED NOT DETECTED Final   Listeria monocytogenes NOT DETECTED NOT DETECTED Final   Staphylococcus species DETECTED (A) NOT DETECTED Final    Comment: CRITICAL RESULT CALLED TO, READ BACK BY AND VERIFIED WITH: PHARMD Kara Brazier on [id number] @1620  by SM    Staphylococcus aureus (BCID) NOT DETECTED NOT DETECTED Final   Staphylococcus epidermidis DETECTED (A) NOT DETECTED Final    Comment: CRITICAL RESULT CALLED TO, READ BACK BY AND VERIFIED WITH: PHARMD Kara Brazier on [phone] @1620  by SM    Staphylococcus lugdunensis NOT DETECTED NOT DETECTED Final   Streptococcus species NOT DETECTED NOT DETECTED Final   Streptococcus agalactiae NOT DETECTED NOT DETECTED Final   Streptococcus pneumoniae NOT DETECTED NOT DETECTED Final   Streptococcus pyogenes NOT DETECTED NOT DETECTED Final   A.calcoaceticus-baumannii NOT DETECTED NOT DETECTED Final   Bacteroides fragilis NOT DETECTED NOT DETECTED Final   Enterobacterales NOT DETECTED NOT DETECTED Final   Enterobacter cloacae complex NOT DETECTED NOT DETECTED Final   Escherichia coli NOT DETECTED NOT DETECTED Final   Klebsiella aerogenes NOT DETECTED NOT DETECTED Final   Klebsiella oxytoca NOT DETECTED NOT DETECTED Final   Klebsiella pneumoniae NOT DETECTED NOT DETECTED Final   Proteus species NOT DETECTED NOT DETECTED Final   Salmonella species NOT DETECTED NOT DETECTED Final   Serratia marcescens NOT DETECTED NOT DETECTED Final   Haemophilus influenzae NOT DETECTED NOT DETECTED Final   Neisseria meningitidis NOT DETECTED NOT DETECTED Final   Pseudomonas aeruginosa NOT DETECTED NOT DETECTED Final   Stenotrophomonas maltophilia NOT DETECTED NOT DETECTED Final   Candida albicans NOT DETECTED NOT DETECTED Final   Candida auris NOT DETECTED NOT DETECTED Final   Candida glabrata NOT DETECTED NOT DETECTED Final   Candida krusei NOT DETECTED NOT DETECTED Final    Candida parapsilosis NOT DETECTED NOT DETECTED Final   Candida tropicalis NOT DETECTED NOT DETECTED Final   Cryptococcus neoformans/gattii NOT DETECTED NOT DETECTED Final   Methicillin resistance mecA/C NOT DETECTED NOT DETECTED Final    Comment: Performed at [facility], [address], [place],  [place] [address]  MRSA Next Gen by PCR, Nasal     Status: None   Collection Time: 07/31/24  1:11 AM   Specimen: Nasal Mucosa; Nasal Swab  Result Value Ref Range Status   MRSA by PCR Next Gen NOT DETECTED NOT DETECTED Final    Comment: (NOTE) The GeneXpert MRSA Assay (FDA approved for NASAL specimens only), is one component of a comprehensive MRSA colonization surveillance program. It is not intended to diagnose MRSA infection nor to guide or monitor treatment for MRSA infections. Test performance is not FDA approved in patients less than 23 years old. Performed at [facility], [address], [place], [place] [address]     Lab Basic Metabolic Panel: Recent Labs  Lab 07/30/24 [phone] 07/30/24 2030 07/31/24 0701 07/31/24 0949 08/01/24 0344 08/02/24 0313 08/08/2024 0341 08/13/2024 1907  NA 146*   < > 145 146* 147* 142 141 138  K 4.3   < > 5.5* 5.0 3.8 3.6 4.0 4.0  CL 105   < > 107 106 110 108 107 106  CO2 29   < > 24 25 28 25 25 25   GLUCOSE 132*   < > 71 100* 105* 134* 152* 157*  BUN 38*   < > 33* 32* 23 20 18 19   CREATININE 0.88   < > 0.95 0.93 0.74 0.62 0.60* 0.61  CALCIUM 8.4*   < > 8.1* 8.1* 7.9* 7.8* 7.6* 7.6*  MG 1.9   < > 2.3  --  2.0 2.0 1.8 1.8  PHOS 2.9  --  4.2  --   --  1.7* 2.6  --    < > = values in this interval not displayed.   Liver Function Tests: Recent Labs  Lab 07/30/24 0452 07/31/24 0701 08/01/24 0344 08/02/24 0313 08/10/2024 0341  AST 60* 67* 57* 71* 50*  ALT 65* 62* 53* 65* 46*  ALKPHOS 189* 223* 208* 422* 439*  BILITOT 0.8 1.8* 1.4* 1.4* 0.8  PROT 6.2* 6.3* 5.7* 5.6* 5.1*  ALBUMIN 2.1* 2.2* 1.9* 1.9* 1.7*   Recent Labs  Lab  07/31/24 0949 08/02/24 0313  LIPASE 86*  --   AMYLASE 158* 157*   Recent Labs  Lab 07/30/24 0452 08/21/2024 0341  AMMONIA 44* 30   CBC: Recent Labs  Lab 07/30/24 0452 07/31/24 0701 08/01/24 0344 08/02/24 0313 08/11/2024 0341  WBC 11.2* 14.7* 9.5 7.0 7.4  HGB 14.9 16.2 14.2 14.0 13.2  HCT 46.9 51.7 44.8 44.3 41.5  MCV 86.7 89.6 87.0 86.9 86.6  PLT 370 320 290 265 248   Cardiac Enzymes: No results for input(s): CKTOTAL, CKMB, CKMBINDEX, TROPONINI in the last 168 hours. Sepsis Labs: Recent Labs  Lab 07/30/24 0844 07/31/24 0701 07/31/24 0949 07/31/24 1511 08/01/24 0344 08/02/24 0313 08/30/2024 0341  PROCALCITON 0.27  --  2.50  --   --   --   --   WBC  --  14.7*  --   --  9.5 7.0 7.4  LATICACIDVEN 1.6  --  5.2* 1.2  --   --   --     Procedures/Operations  ***   [employer] 08/04/2024, 6:40 PM

## 2024-09-03 NOTE — Progress Notes (Signed)
 eLink Physician-Brief Progress Note Patient Name: EBUBECHUKWU JEDLICKA DOB: December 22, 1950 MRN: [id number]   Date of Service  09/02/2024  HPI/Events of Note  Patient is DNR with interventions requested.  Currently mechanically ventilated.  After turning for clean up, the patient suddenly became pulseless with PEA.  No interventions were performed.  Time of death is 08/16/31  eICU Interventions  Attempted to call Jackie-no answer  Attempted to call [name] -notified of time of death and he will reach out to [place] to try to loop her in.     Intervention Category Minor Interventions: Clinical assessment - ordering diagnostic tests  Lesslie Mckeehan 08/19/2024, 9:43 PM

## 2024-09-03 NOTE — Progress Notes (Signed)
 PHARMACY - ANTICOAGULATION CONSULT NOTE  Pharmacy Consult:  Heparin  Indication: Bilateral UE DVTs  Allergies  Allergen Reactions   Clonidine  Other (See Comments)    Weakness, lower extremity paralysis feeling   Penicillins Shortness Of Breath    Patient reports reaction occurred as a teenager and he experienced shortness of breath not requiring hospitalization.    Amlodipine  Swelling   Lisinopril Swelling   Metoprolol Swelling   Tylenol  [Acetaminophen ]    Cefepime  Hives    Patient had new hives on trunk and upper legs following 1 dose of cefepime  relieved by IV benadryl . On vent, but no increased Fio2 needed.     Patient Measurements: Height: 5' 10 (177.8 cm) Weight: 65.3 kg (143 lb 15.4 oz) IBW/kg (Calculated) : 73 HEPARIN  DW (KG): 67.4  Vital Signs: Temp: 99 F (37.2 C) (12/01 0630) Temp Source: Esophageal (12/01 0400) BP: 122/77 (12/01 0630) Pulse Rate: 107 (12/01 0630)  Labs: Recent Labs    07/31/24 0949 08/01/24 0344 08/02/24 0313 08/06/2024 0341  HGB  --  14.2 14.0 13.2  HCT  --  44.8 44.3 41.5  PLT  --  290 265 248  LABPROT  --  17.2*  --  14.9  INR  --  1.3*  --  1.1  HEPARINUNFRC  --  0.38 0.42 0.24*  CREATININE 0.93 0.74 0.62 0.60*  TROPONINIHS 282*  --   --   --     Estimated Creatinine Clearance: 73.7 mL/min (A) (by C-G formula based on SCr of 0.6 mg/dL (L)).   Medical History: Past Medical History:  Diagnosis Date   Hypertension      Assessment: 58 YOM presented with ICH and thyroid  storm.  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  Repeat CT on 11/25 showed evolution of R thalamic hemorrhage with mild edema and decreased IVH.  Pharmacy consulted to dose IV heparin  using stroke protocol.    Heparin  level slightly subtherapeutic at 0.24 on infusion at 800 units/hr. No bleeding noted. CBC stable.  Goal of Therapy:  Heparin  level 0.3-0.5 units/ml Monitor platelets by anticoagulation protocol: Yes   Plan:  Increase heparin  slightly  to 850 units/hr Daily heparin  level and CBC  Rankin Sams, PharmD, BCPS, BCCCP Clinical Pharmacist

## 2024-09-03 NOTE — Consult Note (Addendum)
 WOC Nurse wound follow up Refer to previous WOC consult note on 11/28. Dark red-purple Deep tissue pressure injury to bilat buttocks and sacrum is evolving to Unstageable in patchy areas, dark red and moist in other places. 7X7cm.  Assessed appearance and discussed plan orf care with daughter at the bedside, who is a engineer, civil (consulting).  Pt is critically ill with multiple systemic factors which can impair healing.  They are on a low airloss mattress to reduce pressure.   Left heel with darker colored blistered intact skin; intact Deep tissue pressure injury, 4X4cm    Right heel with darker colored blistered intact skin; Deep tissue pressure injury 3X3cm.     Continue present plan of care as previously provided for bedside nurses to perform with [employer] to assist with removal of nonviable tissue to buttocks/sacrum.  Heels with Xeroform gauze and reduced pressure with bilat Prevalon boots.   WOC team will re-assess weekly to determine if a change in the plan of care should be made at that time.  Thank-you,  Stephane Fought MSN, RN, CWOCN, CWCN-AP, CNS Contact Mon-Fri 0700-1500: [phone]

## 2024-09-03 NOTE — Procedures (Signed)
 Extubation Procedure Note  Patient Details:   Name: Lawrence Barnett DOB: [birth year] MRN: [id number]   Airway Documentation:  Airway 7.5 mm (Active)  Secured at (cm) 25 cm 08/07/2024 2037  Measured From Lips 08/04/2024 2037  Secured Location Right 08/23/2024 2037  Secured By [employer] 09/01/2024 2037  Bite Block No 08/06/2024 2037  Tube Holder Repositioned Yes 08/19/2024 2037  Prone position No 08/24/2024 2037  Cuff Pressure (cm H2O) Green OR 18-26 [name] 08/06/2024 2037  Site Condition Dry 08/20/2024 2037   Vent end date: 08/27/2024 Vent end time: 2150   Evaluation  O2 sats: currently acceptable Complications: No apparent complications Patient did tolerate procedure well. Bilateral Breath Sounds: Coarse crackles, Diminished   RT extubated pt after receiving a call from RN stating that the pt has passed on the vent. TOD @2132 .   Marry JAYSON Mattock 08/11/2024, 10:16 PM

## 2024-09-03 NOTE — Plan of Care (Signed)
  Problem: Health Behavior/Discharge Planning: Goal: Goals will be collaboratively established with patient/family Outcome: Progressing   Problem: Health Behavior/Discharge Planning: Goal: Ability to manage health-related needs will improve Outcome: Not Progressing   Problem: Self-Care: Goal: Ability to participate in self-care as condition permits will improve Outcome: Not Progressing

## 2024-09-03 NOTE — Progress Notes (Signed)
 [facility] ADULT ICU REPLACEMENT PROTOCOL   The patient does apply for the [facility] Adult ICU Electrolyte Replacment Protocol based on the criteria listed below:   1.Exclusion criteria: TCTS, ECMO, Dialysis, and Myasthenia Gravis patients 2. Is GFR >/= 30 ml/min? Yes.    Patient's GFR today is >60 3. Is SCr </= 2? Yes.   Patient's SCr is 0.60 mg/dL 4. Did SCr increase >/= 0.5 in 24 hours? No. 5.Pt's weight >40kg  Yes.   6. Abnormal electrolyte(s): Mag 1.8  7. Electrolytes replaced per protocol 8.  Call MD STAT for K+ </= 2.5, Phos </= 1, or Mag </= 1 Physician:  Rada Henry Recardo LELON 08/11/2024 4:33 AM

## 2024-09-03 NOTE — Progress Notes (Addendum)
 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 Pt was progressing thru his hospitalization with a superimposed infection 2/2 suspected aspiration pneumonia being empirically treated.    Pt's mental status declined yesterday evening prompting overnight evaluation via RRT. Pt was reportedly found obtunded, tachycardic and tachypneic. VSS however. Abx were broadened, pt was given fluids, and stat cth revealing mildly increased ventricular size concerning for mildly progressive hydrocephalus. Neuro was reached out to who recommended repeat cth to eval progression further at 0800.    Pt's labs at 0200 this am revealed multiple areas for improvement that could contribute to pt's mental status as well. Thru the course of the day however it appears that pt's mental status did not improve, bicarb/renal function/ammonia/lactate/sodium have been worsening as well. Cth has yet to be complete.    Ccm was consulted 11/16 PM as pt remains obtunded and req ICU transfer.  Intubated, on peripheral pressors, multiple abnormal labs  Pertinent  Medical History  Htn  Significant Hospital Events: Including procedures, antibiotic start and stop dates in addition to other pertinent events   11/11 admitted to ICU  11/13 transferred to floor  11/16 CCM consulted and transferred back to ICU 11/16, intubated, on peripheral pressors 11/17-did tolerate, mental status data remain poor 11/18-tolerated weaning pressor support Tolerating weaning, remains encephalopathic11/21 11/22 remains encephalopathic, on full vent support 11/23 encephalopathic - Has remained off sedation . No significant overnight events. Not responsive to  commands  07/27/24 - afebrile . On vent, fio2 30%. On Tube Feed. Niece Randine at bedside. 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 Says patient was never married and no kids. Live in [place] and moved to [place] 5y ago to be near sister . Now sister is in a facility. No overnight issues. They understand trach is an option in their care but reports no decision made TSHJ < 0.1 (same as 07/19/24) FT4 normalized 0.93 (was 2.3 and high 07/19/24)  07/28/24: Did PSV. 30% fio2 on vent. On TF. Afebrile.  Per RN he has followed commands and is awake  with just weakness on right side. Per niece at tbedside -mental status is waxing and waning. Niece is not ready to decide on trach UE DVT - > CT head with natural progresson of bleed -> Niece not decided yet on IV heparin  but rcommended by stroke team Thyroid  meds: reduced Hydrocort  and Inderal  dosing EEG - metabolic encephaloathy 07/29/24 - remains on vent. 40%. Afebrile v 38F . Per RN followed commands yesterday but this morning remains comatose though did have gag (both to RN , niece and MD). Niece gave verbal consent for IV heparin . Niece not readyy for trach  IV Heparing gtt started for UE DVT still on lasix , looks dry but for UE edema which is DVT. CXR is dry ThyroidL: INderal  stopped and HC reduced OBtunded but has gag iPAL with niece - undecided on code status and trach 11/.27/25:  new fever now but wbc better. PEr RN - obtunded and might withdraw to deep pain on RUE and LLE -very minimal Softer BP / condtinued obtundation - > releat Head CT: continued evolution of intraparenchymal hemorrhage in the right thalamus with slightly decreased hyperdense  blood products, slightly increased edema and similar mass effect.  Slight increase in small volume of intraventricular hemorrhage. No hydrocephalus. -> heoarin restarted Long actiing insulin  held Cultures MRSA PCR neg Trach aspirate - GREW MSSA on 08/02/24 Blood culture UA abnroma  -+ 11/27- TSH < 0.1/FT4 normal. ET tbu 4.4cm above carina. Febrile., rising wbc. Overnight vomited -> TF held -> hypogluycemic . Also had  NSVTACH at time of vomit.  Cefepime  stared by eMD. This AM LFT abnormal with rising bilribuin. Post family uodate - Low BP and has mottling LE. D/w Dr Jerri - feels CT head stable but overall ppor prognosis  S/p CVL R internal jugular Lioase  81 and < 2X ULN , AMUYLASE Also up. RUQ US   -> Circumferential gallbladder wall thickening with wall edema, which in the absence of stones and pericholecystic fluid, may be due to acute hepatitis, chronic liver disease, upper abdominal inflammation, or volume overload from either CHF or renal failure.  iPAL by NEURO - full code. Nephew is DPOA but defers decision to niece. Niece does not want to consider trach (commit v not do) till at least 3 weeks post intubation Changed tO MERREM  08/01/24 - trach aspirate growing staph. Remains on IV heparin  gtt. Off levophed , On Vent  Fio2 40%. FEbrile since 07/29/24. Also vomited for 2nd day in a row at evenign wwith trickle feeds. LFT better. Remains obtunded. Mottlin improved but has hives (pre Merrem, psost cefepime ) Per RN followe commmand for nephew (did not for neurology) Restatt Tricke feeds Wound care consult - DTI a HAPI progressing to Unstageable  Interim History / Subjective:  No acute event overnight, call patient is still comatose not responsive, and not on any sedation. Family at bedside, family do not want trach and PEG, needs more time before this deciding comfort.  Palliative team is brought on board Objective    Blood pressure 105/72, pulse (!) 105, temperature 99 F (37.2 C), resp. rate 18, height 5' 10 (1.778 m), weight 65.3 kg, SpO2 100%.    Vent Mode: PRVC FiO2 (%):  [40 %] 40 % Set Rate:  [14 bmp] 14 bmp Vt Set:  [440 mL] 440 mL PEEP:  [5 cmH20] 5 cmH20 Pressure Support:  [8 cmH20] 8 cmH20 Plateau Pressure:  [14 cmH20-17 cmH20] 14 cmH20   Intake/Output  Summary (Last 24 hours) at 08/13/2024 9271 Last data filed at 08/25/2024 0700 Gross per 24 hour  Intake 4262.84 ml  Output 2350 ml  Net 1912.84 ml   Filed Weights   08/01/24 0347 08/02/24 0328 09/02/2024 0348  Weight: 62.4 kg 62.8 kg 65.3 kg    Examination: General Appearance: Critically ill, comatose HEENT: NCAT CVS: S1-S2 normal, no murmur Lungs: Symmetrical chest wall movement, clear to auscultation-on the vent Abdomen: Soft nontender nondistended Extremities: Mild edema Skin: Sacral decub ulcers Neuro: Comatose, off sedation    Resolved problem list  AKI High Na SEptic shock 07/31/24 -  off pressors since 08/01/24 Assessment and Plan   Intraparenchymal hemorrhage with IVH and hydrocephalus Multiple watershed strokes - CT 11/25 with improved hge -  EEG 11/26 with metabolic encephalopathy  11/30 -  Off lactulose  since 07/29/24. CT head 11/27 - stable per Dr JERRI.  Remains COMATOSE/OBTUNDED for RN/CCM/Neuro MD exam though followed commands for frmaily 08/01/24  PLAN - Neuroprotective measures- normothermia, euglycemia, HOB greater than 30, head in neutral alignment, normocapnia, normoxia -Neurology continues to follow - monitor without LACTULOSE  - 12/1-ammonia 30    Acute Respiratory Failure - intubated 07/19/24  08/31/2024 - > does not meet criteria for SBT/Extubation in setting of Acute Respiratory Failure due to coma    Plan - No extubation till more awake -Per family do not want trach and PEG - Need more time before exploring comfort care.  Palliative care team on board - niece 07/29/24 feels that patient needs more time in ICU with/without EET tube before any decision on trach is made (recent example of her mom who survived sepss)   Hypernatremia  - 08/02/24 - Na NORMAL  Plan -  free water  to Q4h ->to ciontinue - monitor off lasix  since 07/29/24   Edema UE despite lasix  - noted 07/28/24 UE DVT Left side diagnosed 07/28/24  11/30 - tolerating  IV heparin  gtt. No overt bleeding. CT head 11/27 stable  Plan  IV heparin  gtt from 07/29/24 (post niece consent) - > neuro to indicate if CT head shows worsening bleed Need to deciide on DOAC baesd on goals of care and trach timing egc   Hyperthyroidism -On methimazole , propranolol , steroids; - stopped inderal  07/29/24 - reduced HC 07/30/24  11/25: -T4 better; TSH will take a while to improve   Plan  -monito off hydrocort  08/01/24 - monitor off inderal  since 11/26/254 - continue methimazole   - continue Potassium Iodide  but rduce dose 07/29/24  Nutrition Possible mild pancreatitis 07/31/24  - vomited 11/27 and 11/28 ? Due to sepsis v mild pancrateie.  But tolreating trickled feeds as of 08/02/24  plan - Continue tube feeds at trickle and slowly escalate   Maxillary sinusitis; last dose 07/28/24 AM Concern for sepsis again 07/30/24 -MSSA VAP (had hives with cefepime )  08/02/24 - FEve slowly improving?  Plan - Narrow abx - d/w Pharmacy - options ? Doxy - Merrme 11/28 -   Xxxxx -Cefazolin- - Cefepime  11/28 - 11/28 (RASH) - Doxy 11/20 - 11/24 - CEfepime  11/15 - 11/20 - Linezolid  11/16 - 11/17 - Vanc 11/15 - 11/17 - Flagyl  11/15 - 11/17 - cEftriaxone  11/15 - 11/15     Abnormal LFT - imprvoing 11/29 New Hyper bilribuininea 07/31/24 - resolved 08/01/24 New Vomoit 07/31/24 x 2 with trickle  ? Mild pancratitis 11/28 - KUB normal  -toeraing trickle. BIli better 08/02/24  Plan  - increase trickle feeds  - recheck LFT with INR   Hypertension  11/26 - off inderal  for thyrptoxicosis 11/27 - off amlodpine due to soft bp   Plan  - monitor off inderal  and amlodipine   HIVES onsett 07/31/24 - post cefepime , pre-Merrem  11/30 improving  Plan  -consider Cefepime  as allergy    HYPOGLYCEMIA  - new 07/31/24 after vomit and TF held  - 11/30 normal sugars  Plan  D5 at 50cc -> to kvo;l  increas if needed  SACRAL DECUB  11/28 per wound care - DTI going to  unstageable  Plan - per wound care  FAMILY  11/24 - Niece Randine updated at bedside 07/28/24 - Randine updated. Goals of care in progress 07/29/24 - iPAL done - full code. Recommended to niece to discuss/consider DNR with nephew. Niece indicated patient needs some more time in ICU before decision on trach 11/27 - none at bedside 11/28 - called Dwayne Carmin over phone: updated-> she is coming later today.IPAL done  by neuro 11/29 - Niece Elo Marmolejos - > updated over phone 11/30 - Niece Tacey Bache update over phone including presence of DTI 12/1- Niece Tacey Tomlinson: Family did not want trach and PEG, Patient is DNR/I & family needs more time before  exploring comfort care.  Palliative is brought on board    LABS    PULMONARY No results for input(s): PHART, PCO2ART, PO2ART, HCO3, TCO2, O2SAT in the last 168 hours.  Invalid input(s): PCO2, PO2   CBC Recent Labs  Lab 08/01/24 0344 08/02/24 0313 08/21/2024 0341  HGB 14.2 14.0 13.2  HCT 44.8 44.3 41.5  WBC 9.5 7.0 7.4  PLT 290 265 248    COAGULATION Recent Labs  Lab 08/01/24 0344 08/04/2024 0341  INR 1.3* 1.1    CARDIAC  No results for input(s): TROPONINI in the last 168 hours. No results for input(s): PROBNP in the last 168 hours.   CHEMISTRY Recent Labs  Lab 07/28/24 0533 07/29/24 0448 07/30/24 0452 07/30/24 2030 07/31/24 0701 07/31/24 0949 08/01/24 0344 08/02/24 0313 08/19/2024 0341  NA 146*   < > 146* 143 145 146* 147* 142 141  K 3.9   < > 4.3 4.6 5.5* 5.0 3.8 3.6 4.0  CL 109   < > 105 107 107 106 110 108 107  CO2 25   < > 29 27 24 25 28 25 25   GLUCOSE 233*   < > 132* 80 71 100* 105* 134* 152*  BUN 37*   < > 38* 32* 33* 32* 23 20 18   CREATININE 0.88   < > 0.88 0.82 0.95 0.93 0.74 0.62 0.60*  CALCIUM 8.6*   < > 8.4* 8.2* 8.1* 8.1* 7.9* 7.8* 7.6*  MG 2.1  --  1.9 2.1 2.3  --  2.0 2.0 1.8  PHOS 2.5  --  2.9  --  4.2  --   --  1.7* 2.6   < > = values in this interval not displayed.    Estimated Creatinine Clearance: 73.7 mL/min (A) (by C-G formula based on SCr of 0.6 mg/dL (L)).   LIVER Recent Labs  Lab 07/30/24 0452 07/31/24 0701 08/01/24 0344 08/02/24 0313 08/14/2024 0341  AST 60* 67* 57* 71* 50*  ALT 65* 62* 53* 65* 46*  ALKPHOS 189* 223* 208* 422* 439*  BILITOT 0.8 1.8* 1.4* 1.4* 0.8  PROT 6.2* 6.3* 5.7* 5.6* 5.1*  ALBUMIN 2.1* 2.2* 1.9* 1.9* 1.7*  INR  --   --  1.3*  --  1.1     INFECTIOUS Recent Labs  Lab 07/30/24 0844 07/31/24 0949 07/31/24 1511  LATICACIDVEN 1.6 5.2* 1.2  PROCALCITON 0.27 2.50  --       ENDOCRINE CBG (last 3)  Recent Labs    08/02/24 1919 08/02/24 2322 08/30/2024 0340  GLUCAP 91 77 75         IMAGING x48h  - image(s) personally visualized  -   highlighted in bold No results found.   Critical care attending attestation note:   CRITICAL CARE  Lenny Drought, MD  [facility] Prefer epic messenger for cross cover needs   My critical care time: 30 minutes  Critical care time was exclusive of separately billable procedures and treating other patients.  Critical care was necessary to treat or prevent imminent or life-threatening deterioration.  Critical care was time spent personally by me on the following activities: development of treatment plan with patient and/or surrogate as well as nursing, discussions with consultants, evaluation of patient's response to treatment, examination of patient, obtaining history from patient or surrogate, ordering and performing treatments and interventions, ordering and review of laboratory studies, ordering and review of radiographic studies, pulse oximetry, re-evaluation of patient's condition and participation in multidisciplinary rounds.

## 2024-09-03 NOTE — Consult Note (Signed)
Palliative Medicine Inpatient Consult Note  Consulting Provider: Sharie Bourbon, MD   Reason for consult:   Palliative Care Consult Services Palliative Medicine Consult  Reason for Consult? 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 Pt was progressing thru his hospitalization with a superimposed infection 2/2 suspected aspiration pneumonia on ventilatory support since 11/16. Palliative care asked to support additional goals of care conversations.   Clinical Assessment/Goals of Care:  *Please note that this is a verbal dictation therefore any spelling or grammatical errors are due to the Dragon Medical One system interpretation.  I have reviewed medical records including EPIC notes, labs and imaging, received report from bedside RN, assessed the patient who is lying in bed critically ill-appearing nonresponsive.    I spoke to patient's niece, Jeven Topper to further discuss diagnosis prognosis, GOC, EOL wishes, disposition and options.   I introduced Palliative Medicine as specialized medical care for people living with serious illness. It focuses on providing relief from the symptoms and stress of a serious illness. The goal is to improve quality of life for both the patient and the family.  Medical History Review and Understanding:  A review of Clara's past medical history significant for hypertension was completed.  Social History:  Chung is originally from [place]  however lived in [place]  for roughly 40 years.  He has never been married nor had children.  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  He moved back to [place]  about 5 years ago to have more time with his sister  as they are the last 2 living of 6 children.  He has various nieces and nephews with whom he has a strong relationship.  Functional and Nutritional State:  Prior to hospitalization Thompson was living in a single-family home with his nephew, [name] in [place]  .  Ramondo was able to attend to all BADLs and IADLs.    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   Brantly had no recent deviations in his nutritional intake.  Advance Directives:  A detailed discussion was had today regarding advanced directives.  Zi does not have active advance directives on file however his healthcare agents are his niece Dwayne and nephew [name].  Code Status:  Concepts specific to code status, artifical feeding and hydration, continued IV antibiotics and rehospitalization was had.  The difference between a aggressive medical intervention path  and a palliative comfort care path for this patient at this time was had.   Encouraged patient/family to consider DNAR status understanding evidenced based poor outcomes in similar hospitalized patient, as the cause of arrest is likely associated with advanced chronic/terminal illness rather than an easily reversible acute cardio-pulmonary event. I explained that DNAR does not change the medical plan and it only comes into effect after a person has arrested (died).  It is a protective measure to keep us  from harming the patient in their last moments of life.   Patient's niece shares that she and her nephew had already determined no chest compressions given patient's current clinical state.  She was agreeable to us  changing this in the CODE STATUS component of the chart.  Discussion:  A  review of Brenen's reason for hospitalization on November 11th was completed.  He apparently was in his normal state of health and his nephew went to check on him finding him on the floor.  Afterward it was determined that he had suffered a right basal  ganglia intracerebral hemorrhage with intraventricular extension.  Dwayne shares he had been despite this well-functioning at the hospital up until about 4 days into hospitalization.  When his level of awareness had declined.  Patient's niece shares that he has been intubated for roughly 2 weeks now and unfortunately his level of alertness and responsiveness has continued to dwindle despite present medical measures.  We reviewed at roughly 10 to 14 days of intubation the need for additional conversations regarding whether or not tracheostomy placement and/or gastrostomy placement would be desired.  Dwayne shares knowing her uncle who was both private and quiet she is not sure this is a level of care he would desire.  Will she as well as her nephew, [name] have talked about this.  The determination at this time is to not pursue this level of care as family understands it would not yield greater outcomes for Elizah at this point in time.  We reviewed the importance of further conversation.  Tracey plans to speak to her mother, [place] sister tonight to better inform her as to what is going on.  We discussed liberation from ventilatory support and transition to full comfort measures.  I explained what this process would look like and expectations thereafter which would be the death of the patient.  Dwayne was thankful for this information and explanation.  She shares the hope to speak to family in the oncoming days and for decision to be made by the end of the week.  In the meanwhile she does come in the morning hours and is agreeable to meeting me at 8 AM tomorrow for further conversation.  Discussed the importance of continued conversation with family and their  medical providers regarding overall plan of care and treatment options, ensuring decisions are within the context of the patients values and GOCs.  Decision Maker: [name] Laos (nephew): [phone] Raja Liska (niece): [phone]  SUMMARY OF  RECOMMENDATIONS   DNAR  Open and honest conversations held in the setting of patient's critical disease burden  Patient's niece shares she and her nephew, [name] have spoken and they do not think Laird would desire tracheostomy or gastrostomy tube  Discussed the idea of liberation from ventilatory support and transition of focus to comfort oriented care  Patient's niece shares the ongoing need and desire to speak to additional family members as many family and friends love Sadarius would want to come and see him prior to transition of care focus  Plan to meet with patient's niece tomorrow morning for further conversation  Ongoing palliative care support  Code Status/Advance Care Planning: DNAR  Palliative Prophylaxis:  Aspiration, Bowel Regimen, Delirium Protocol, Frequent Pain Assessment, Oral Care, Palliative Wound Care, and Turn Reposition  Additional Recommendations (Limitations, Scope, Preferences): Continue current care  Psycho-social/Spiritual:  Desire for further Chaplaincy support: Yes Additional Recommendations: Education on critical illness and long-term outcomes   Prognosis: Exceptionally limited as patient is presently being artificially supported by mechanical ventilation  Discharge Planning: Discharge plan to be determined  Vitals:   08/19/2024 1438 08/31/2024 1514  BP: 100/70 (!) 85/58  Pulse: (!) 106   Resp: 19   Temp: 99.3 F (37.4 C)   SpO2: 100%     Intake/Output Summary (Last  24 hours) at 08/20/2024 1529 Last data filed at 08/14/2024 1400 Gross per 24 hour  Intake 3903.35 ml  Output 2460 ml  Net 1443.35 ml   Last Weight  Most recent update: 08/07/2024  3:48 AM    Weight  65.3 kg (143 lb 15.4 oz)            LABS: CBC:    Component Value Date/Time   WBC 7.4 09/02/2024 0341   HGB 13.2 08/08/2024 0341   HGB 16.2 10/07/2012 1618   HCT 41.5 08/26/2024 0341   HCT 47.8 10/07/2012 1618   PLT 248 08/14/2024 0341   PLT 306 10/07/2012 1618   MCV 86.6  08/22/2024 0341   MCV 92 10/07/2012 1618   NEUTROABS 3.5 07/14/2024 1904   LYMPHSABS 2.1 07/14/2024 1904   MONOABS 0.4 07/14/2024 1904   EOSABS 0.1 07/14/2024 1904   BASOSABS 0.0 07/14/2024 1904   Comprehensive Metabolic Panel:    Component Value Date/Time   NA 141 08/19/2024 0341   NA 139 10/08/2012 0431   K 4.0 08/07/2024 0341   K 4.0 10/08/2012 0431   CL 107 08/23/2024 0341   CL 107 10/08/2012 0431   CO2 25 08/07/2024 0341   CO2 25 10/08/2012 0431   BUN 18 08/18/2024 0341   BUN 10 10/08/2012 0431   CREATININE 0.60 (L) 08/14/2024 0341   CREATININE 0.83 10/08/2012 0431   GLUCOSE 152 (H) 09/02/2024 0341   GLUCOSE 133 (H) 10/08/2012 0431   CALCIUM 7.6 (L) 08/16/2024 0341   CALCIUM 9.4 10/08/2012 0431   AST 50 (H) 08/14/2024 0341   ALT 46 (H) 08/08/2024 0341   ALKPHOS 439 (H) 08/21/2024 0341   BILITOT 0.8 08/19/2024 0341   PROT 5.1 (L) 08/25/2024 0341   ALBUMIN 1.7 (L) 09/02/2024 0341   Gen: Elderly African-American male critically ill HEENT: Core track, endotracheal tube in place, dry mucous membranes CV: Irregular rate and rhythm  PULM: On mechanical ventilatory support ABD: soft/nontender  EXT: Bilateral upper extremity edema Neuro: Somnolent, not responsive  PPS: 10%   This conversation/these recommendations were discussed with patient primary care team, Dr. Sharie ______________________________________________________ [facility] Palliative Medicine Team Team Cell Phone: [phone] Please utilize secure chat with additional questions, if there is no response within 30 minutes please call the above phone number  Total Time: 75 Billing based on MDM: High  Palliative Medicine Team providers are available by phone from 7am to 7pm daily and can be reached through the team cell phone.  Should this patient require assistance outside of these hours, please call the patient's attending physician.

## 2024-09-03 DEATH — deceased
